# Patient Record
Sex: Female | Born: 1945 | Race: White | Hispanic: No | Marital: Married | Smoking: Former smoker
Health system: Southern US, Community
[De-identification: ages and names within clinical notes are randomized; demographics above are authoritative.]

## PROBLEM LIST (undated history)

## (undated) DIAGNOSIS — E785 Hyperlipidemia, unspecified: Secondary | ICD-10-CM

## (undated) DIAGNOSIS — J40 Bronchitis, not specified as acute or chronic: Secondary | ICD-10-CM

## (undated) DIAGNOSIS — E049 Nontoxic goiter, unspecified: Secondary | ICD-10-CM

## (undated) DIAGNOSIS — J329 Chronic sinusitis, unspecified: Secondary | ICD-10-CM

## (undated) DIAGNOSIS — J309 Allergic rhinitis, unspecified: Secondary | ICD-10-CM

## (undated) DIAGNOSIS — M1990 Unspecified osteoarthritis, unspecified site: Secondary | ICD-10-CM

## (undated) DIAGNOSIS — Z860100 Personal history of colon polyps, unspecified: Secondary | ICD-10-CM

## (undated) DIAGNOSIS — H269 Unspecified cataract: Secondary | ICD-10-CM

## (undated) DIAGNOSIS — E039 Hypothyroidism, unspecified: Secondary | ICD-10-CM

## (undated) DIAGNOSIS — R87613 High grade squamous intraepithelial lesion on cytologic smear of cervix (HGSIL): Secondary | ICD-10-CM

## (undated) DIAGNOSIS — H8109 Meniere's disease, unspecified ear: Secondary | ICD-10-CM

## (undated) DIAGNOSIS — B029 Zoster without complications: Secondary | ICD-10-CM

## (undated) DIAGNOSIS — R7303 Prediabetes: Secondary | ICD-10-CM

## (undated) DIAGNOSIS — C50919 Malignant neoplasm of unspecified site of unspecified female breast: Secondary | ICD-10-CM

## (undated) DIAGNOSIS — Z972 Presence of dental prosthetic device (complete) (partial): Secondary | ICD-10-CM

## (undated) DIAGNOSIS — R7309 Other abnormal glucose: Secondary | ICD-10-CM

## (undated) DIAGNOSIS — M797 Fibromyalgia: Secondary | ICD-10-CM

## (undated) DIAGNOSIS — F17200 Nicotine dependence, unspecified, uncomplicated: Secondary | ICD-10-CM

## (undated) DIAGNOSIS — M7989 Other specified soft tissue disorders: Secondary | ICD-10-CM

## (undated) DIAGNOSIS — Z923 Personal history of irradiation: Secondary | ICD-10-CM

## (undated) HISTORY — DX: Nicotine dependence, unspecified, uncomplicated: F17.200

## (undated) HISTORY — DX: Other abnormal glucose: R73.09

## (undated) HISTORY — PX: EYE SURGERY: SHX253

## (undated) HISTORY — DX: Personal history of colon polyps, unspecified: Z86.0100

## (undated) HISTORY — DX: Meniere's disease, unspecified ear: H81.09

## (undated) HISTORY — DX: Zoster without complications: B02.9

## (undated) HISTORY — DX: Hyperlipidemia, unspecified: E78.5

## (undated) HISTORY — DX: Unspecified cataract: H26.9

## (undated) HISTORY — DX: Allergic rhinitis, unspecified: J30.9

## (undated) HISTORY — DX: Other specified soft tissue disorders: M79.89

## (undated) HISTORY — DX: Fibromyalgia: M79.7

## (undated) HISTORY — DX: High grade squamous intraepithelial lesion on cytologic smear of cervix (HGSIL): R87.613

## (undated) HISTORY — DX: Personal history of colonic polyps: Z86.010

## (undated) HISTORY — DX: Chronic sinusitis, unspecified: J32.9

---

## 1997-06-15 HISTORY — PX: APPENDECTOMY: SHX54

## 2000-11-05 HISTORY — PX: BREAST BIOPSY: SHX20

## 2004-03-30 HISTORY — PX: COLONOSCOPY W/ BIOPSIES: SHX1374

## 2005-10-24 ENCOUNTER — Encounter: Payer: Self-pay | Admitting: Cardiovascular Disease

## 2006-10-15 LAB — HM COLONOSCOPY

## 2007-01-23 ENCOUNTER — Ambulatory Visit: Payer: Self-pay

## 2007-01-29 ENCOUNTER — Ambulatory Visit: Payer: Self-pay

## 2007-02-27 ENCOUNTER — Ambulatory Visit: Payer: Self-pay | Admitting: Gastroenterology

## 2008-08-27 ENCOUNTER — Ambulatory Visit: Payer: Self-pay | Admitting: Family Medicine

## 2008-08-27 ENCOUNTER — Encounter: Payer: Self-pay | Admitting: Cardiovascular Disease

## 2008-09-07 ENCOUNTER — Encounter: Payer: Self-pay | Admitting: Cardiovascular Disease

## 2008-09-14 HISTORY — PX: EXTERNAL EAR SURGERY: SHX627

## 2009-03-17 ENCOUNTER — Encounter: Payer: Self-pay | Admitting: Cardiovascular Disease

## 2009-10-15 DIAGNOSIS — H8109 Meniere's disease, unspecified ear: Secondary | ICD-10-CM

## 2009-10-15 HISTORY — DX: Meniere's disease, unspecified ear: H81.09

## 2009-10-15 HISTORY — PX: BREAST BIOPSY: SHX20

## 2010-08-21 ENCOUNTER — Encounter: Payer: Self-pay | Admitting: Cardiovascular Disease

## 2010-08-22 ENCOUNTER — Encounter: Payer: Self-pay | Admitting: Cardiovascular Disease

## 2010-09-05 ENCOUNTER — Ambulatory Visit: Payer: Self-pay | Admitting: Cardiovascular Disease

## 2010-09-05 DIAGNOSIS — E785 Hyperlipidemia, unspecified: Secondary | ICD-10-CM

## 2010-09-05 DIAGNOSIS — E1169 Type 2 diabetes mellitus with other specified complication: Secondary | ICD-10-CM | POA: Insufficient documentation

## 2010-09-05 DIAGNOSIS — R0602 Shortness of breath: Secondary | ICD-10-CM | POA: Insufficient documentation

## 2010-09-05 DIAGNOSIS — F17200 Nicotine dependence, unspecified, uncomplicated: Secondary | ICD-10-CM | POA: Insufficient documentation

## 2010-11-14 NOTE — Letter (Signed)
Summary: [facility] Office Note   [facility] Office Note   Imported By: Roderic Ovens 09/19/2010 16:07:33  _____________________________________________________________________  External Attachment:    Type:   Image     Comment:   External Document

## 2010-11-14 NOTE — Assessment & Plan Note (Signed)
Summary: NP6/AMD   Visit Type:  Initial Consult Primary Provider:  Ailene Ards, M.D.  CC:  c/o getting tired easily; she would like to start an exercise routine.Marland Kitchen  History of Present Illness: 65 yo woman, patient of Dr. Nilda Simmer, with fibromyalgia, hyperlipidemia, long smoking hx, obesity, family hx of CAD, who presents with SOB with exertion.   She does not exercise, has had significant weight gain over the past year or so. She has been trying to keep up with her twin sister who exercises and has been having trouble. Some chest tightness with SOB. She is uncertain if symptoms are secondary to lung problems from smoking, from deconditioning or from her heart.  She refused EKG today. We will try to obtain the most recent ekg from Dr. Katrinka Blazing EKG from 2010 shows NSR with rate of 75, unable to exclude anterospetal infarct  Stress test from 10/2005: no ischemia, no ekg changes  08/2010:   Chol 203, HDL 50, 128  Preventive Screening-Counseling & Management  Caffeine-Diet-Exercise     Does Patient Exercise: yes      Drug Use:  no.    Current Medications (verified): 1)  Sertraline Hcl 50 Mg Tabs (Sertraline Hcl) .... One Tablet Once Daily 2)  Aspirin 325 Mg Tabs (Aspirin) .... One Tablet Once Daily 3)  Furosemide 20 Mg Tabs (Furosemide) .... One Tablet Two Times A Day 4)  Fish Oil Maximum Strength 1200 Mg Caps (Omega-3 Fatty Acids) .... Two Tablets Daily 5)  Glucosamine Sulfate 1000 Mg Caps (Glucosamine Sulfate) .... One Tablet Once Daily 6)  Niacin Cr 500 Mg Cr-Caps (Niacin) .... One Tablet Once Daily 7)  Osteo Bi-Flex Adv Joint Shield  Tabs (Misc Natural Products) .... With Magnesium Two Tablet Once Daily 8)  Vitamin D3 2000 Unit Caps (Cholecalciferol) .... One Daily 9)  Chew For Health .... One Tablet Qd 10)  Swiss Kriss Herballaxative  Allergies (verified): No Known Drug Allergies  Past History:  Past Medical History: Last updated:  09/21/10 fibromyalgia appendectomy hyperlipidemia long smoking  Family History: Last updated: 21-Sep-2010 Father: Deceased; MI age 46 Mother:Deceased; after her birth of daughters.  Social History: Last updated: 2010/09/21 Married  Tobacco Use - Yes. 7 to 10 cigarettes per day for the past 40 yrs. Alcohol Use - yes- occas. Regular Exercise - yes Drug Use - no  Risk Factors: Exercise: yes (21-Sep-2010)  Risk Factors: Smoking Status: current (09-21-10)  Past Surgical History: right ear surgery appendectomy  Family History: Father: Deceased; MI age 29 Mother:Deceased; after her birth of daughters.  Social History: Married  Tobacco Use - Yes. 7 to 10 cigarettes per day for the past 40 yrs. Alcohol Use - yes- occas. Regular Exercise - yes Drug Use - no Does Patient Exercise:  yes Drug Use:  no  Review of Systems       The patient complains of chest pain and dyspnea on exertion.  The patient denies fever, weight loss, weight gain, vision loss, decreased hearing, hoarseness, syncope, peripheral edema, prolonged cough, abdominal pain, incontinence, muscle weakness, depression, and enlarged lymph nodes.    Vital Signs:  Patient profile:   65 year old female Height:      63 inches Weight:      175.50 pounds BMI:     31.20 Pulse rate:   78 / minute BP sitting:   122 / 62  (left arm) Cuff size:   regular  Vitals Entered By: Bishop Dublin, CMA (09/21/2010 2:53 PM)  Physical Exam  General:  Well developed, well nourished, in no acute distress. Head:  normocephalic and atraumatic Neck:  Neck supple, no JVD. No masses, thyromegaly or abnormal cervical nodes. Lungs:  Clear bilaterally to auscultation and percussion. Heart:  Non-displaced PMI, chest non-tender; regular rate and rhythm, S1, S2 without murmurs, rubs or gallops. Carotid upstroke normal, no bruit.  Pedals normal pulses. No edema, no varicosities. Abdomen:  Bowel sounds positive; abdomen soft and  non-tender without masses Msk:  Back normal, normal gait. Muscle strength and tone normal. Pulses:  pulses normal in all 4 extremities Extremities:  No clubbing or cyanosis. Neurologic:  Alert and oriented x 3. Skin:  Intact without lesions or rashes. Psych:  Normal affect.   Impression & Recommendations:  Problem # 1:  DYSPNEA (ICD-786.05) Symptoms are likley multifactorial. Long smoking hx, very deconditioned (old stress test in 2007, she only walked 3 minutes). Uncertain if angina is a part of her presentation. We talked at length ab out the various treatment options: including stress testing. She would like to wait on any testign and try to walk more to get into shape. She will call me if her symptoms get worse, including any chest pain.  Her updated medication list for this problem includes:    Aspirin 325 Mg Tabs (Aspirin) ..... One tablet once daily    Furosemide 20 Mg Tabs (Furosemide) ..... One tablet two times a day  Problem # 2:  HYPERLIPIDEMIA-MIXED (ICD-272.4) I have encouraged her to start a low dose statin, start every other day and advance to daily of tolerated. She is at high risk given her long smoking hx, family hx  Her updated medication list for this problem includes:    Niacin Cr 500 Mg Cr-caps (Niacin) ..... One tablet once daily    Crestor 5 Mg Tabs (Rosuvastatin calcium) .Marland Kitchen... Take one tablet by mouth daily.  Problem # 3:  SMOKER (ICD-305.1) She will try chantix in an attempt to stop smoking  Patient Instructions: 1)  Your physician has recommended you make the following change in your medication: Start taking Crestor 5mg  once daily  and Chantix as directed. Prescriptions: CHANTIX CONTINUING MONTH PAK 1 MG TABS (VARENICLINE TARTRATE) use as directed  #1 x 0   Entered by:   Cloyde Reams RN   Authorized by:   Dossie Arbour MD   Signed by:   Cloyde Reams RN on 09/05/2010   Method used:   Electronically to        [facility] [phone]*  (retail)       [address] [place], [place]  [address]       Ph: [phone]       Fax: [phone]   RxID:   [id number] CHANTIX STARTING MONTH PAK 0.5 MG X 11 & 1 MG X 42 TABS (VARENICLINE TARTRATE) use as directed  #1 x 0   Entered by:   Cloyde Reams RN   Authorized by:   Dossie Arbour MD   Signed by:   Cloyde Reams RN on 09/05/2010   Method used:   Electronically to        [facility] [phone]* (retail)       [address] [place], [place]  [address]       Ph: [phone]       Fax: [phone]   RxID:   [id number] CRESTOR 5 MG TABS (ROSUVASTATIN CALCIUM) Take one tablet by mouth  daily.  #30 x 3   Entered by:   Cloyde Reams RN   Authorized by:   Dossie Arbour MD   Signed by:   Cloyde Reams RN on 09/05/2010   Method used:   Print then Give to Patient   RxID:   [id number]   Appended Document: NP6/AMD correction;  she exercised for 7 minutes (not three minutes) on her stress test and achieved 7 METS

## 2010-11-14 NOTE — Letter (Signed)
Summary: [facility] Office Note   [facility] Office Note   Imported By: Roderic Ovens 09/20/2010 11:44:28  _____________________________________________________________________  External Attachment:    Type:   Image     Comment:   External Document

## 2011-05-02 ENCOUNTER — Encounter: Payer: Self-pay | Admitting: Cardiovascular Disease

## 2011-10-16 HISTORY — PX: COLPOSCOPY: SHX161

## 2011-10-16 LAB — HM PAP SMEAR

## 2011-11-22 LAB — HM MAMMOGRAPHY: HM Mammogram: NORMAL

## 2012-07-08 ENCOUNTER — Encounter: Payer: Self-pay | Admitting: Family Medicine

## 2012-07-10 ENCOUNTER — Encounter: Payer: Self-pay | Admitting: *Deleted

## 2012-08-14 ENCOUNTER — Telehealth: Payer: Self-pay

## 2012-08-14 DIAGNOSIS — Z1211 Encounter for screening for malignant neoplasm of colon: Secondary | ICD-10-CM

## 2012-08-14 NOTE — Telephone Encounter (Signed)
Pt is wanting to schedule an colonoscopy at dr Nigel Bridgeman office at [facility] clinic  Please call [phone]   Fax number for [facility] clinic [phone]

## 2012-08-15 NOTE — Telephone Encounter (Signed)
Referral has been sent to Dr. Bluford Kaufmann at [facility]

## 2012-08-16 NOTE — Telephone Encounter (Signed)
Left message for patient to return call.

## 2012-08-18 NOTE — Telephone Encounter (Signed)
Called patient to advise  °

## 2012-08-19 ENCOUNTER — Telehealth: Payer: Self-pay

## 2012-08-19 NOTE — Telephone Encounter (Signed)
Pt referred by [place] to [facility] clinic, Aundra Millet is needing copies of pt last ov notes and any lab results please fax info to [phone] ATTEN: Aundra Millet if any question please call [phone]

## 2012-10-06 ENCOUNTER — Telehealth: Payer: Self-pay | Admitting: *Deleted

## 2012-10-06 ENCOUNTER — Encounter: Payer: Self-pay | Admitting: Family Medicine

## 2012-10-06 ENCOUNTER — Ambulatory Visit (INDEPENDENT_AMBULATORY_CARE_PROVIDER_SITE_OTHER): Payer: Medicare Other | Admitting: Family Medicine

## 2012-10-06 VITALS — BP 112/62 | HR 69 | Temp 98.0°F | Resp 16 | Ht 62.0 in | Wt 196.2 lb

## 2012-10-06 DIAGNOSIS — Z0001 Encounter for general adult medical examination with abnormal findings: Secondary | ICD-10-CM | POA: Insufficient documentation

## 2012-10-06 DIAGNOSIS — I878 Other specified disorders of veins: Secondary | ICD-10-CM | POA: Insufficient documentation

## 2012-10-06 DIAGNOSIS — Z01419 Encounter for gynecological examination (general) (routine) without abnormal findings: Secondary | ICD-10-CM | POA: Insufficient documentation

## 2012-10-06 DIAGNOSIS — R7309 Other abnormal glucose: Secondary | ICD-10-CM

## 2012-10-06 DIAGNOSIS — I872 Venous insufficiency (chronic) (peripheral): Secondary | ICD-10-CM

## 2012-10-06 DIAGNOSIS — F329 Major depressive disorder, single episode, unspecified: Secondary | ICD-10-CM | POA: Insufficient documentation

## 2012-10-06 DIAGNOSIS — Z0000 Encounter for general adult medical examination without abnormal findings: Secondary | ICD-10-CM

## 2012-10-06 DIAGNOSIS — F419 Anxiety disorder, unspecified: Secondary | ICD-10-CM | POA: Insufficient documentation

## 2012-10-06 DIAGNOSIS — F341 Dysthymic disorder: Secondary | ICD-10-CM

## 2012-10-06 DIAGNOSIS — F32A Depression, unspecified: Secondary | ICD-10-CM | POA: Insufficient documentation

## 2012-10-06 DIAGNOSIS — Z23 Encounter for immunization: Secondary | ICD-10-CM | POA: Insufficient documentation

## 2012-10-06 DIAGNOSIS — E7800 Pure hypercholesterolemia, unspecified: Secondary | ICD-10-CM

## 2012-10-06 LAB — COMPREHENSIVE METABOLIC PANEL
ALT: 20 U/L (ref 0–35)
AST: 21 U/L (ref 0–37)
Albumin: 4.5 g/dL (ref 3.5–5.2)
Alkaline Phosphatase: 67 U/L (ref 39–117)
BUN: 11 mg/dL (ref 6–23)
CO2: 27 mEq/L (ref 19–32)
Calcium: 9.3 mg/dL (ref 8.4–10.5)
Chloride: 103 mEq/L (ref 96–112)
Creat: 1.08 mg/dL (ref 0.50–1.10)
Glucose, Bld: 102 mg/dL — ABNORMAL HIGH (ref 70–99)
Potassium: 4 mEq/L (ref 3.5–5.3)
Sodium: 139 mEq/L (ref 135–145)
Total Bilirubin: 0.6 mg/dL (ref 0.3–1.2)
Total Protein: 7 g/dL (ref 6.0–8.3)

## 2012-10-06 LAB — POCT URINALYSIS DIPSTICK
Bilirubin, UA: NEGATIVE
Blood, UA: NEGATIVE
Glucose, UA: NEGATIVE
Ketones, UA: NEGATIVE
Leukocytes, UA: NEGATIVE
Nitrite, UA: NEGATIVE
Protein, UA: NEGATIVE
Spec Grav, UA: 1.01
Urobilinogen, UA: 0.2
pH, UA: 7

## 2012-10-06 LAB — CBC WITH DIFFERENTIAL/PLATELET
Basophils Absolute: 0 10*3/uL (ref 0.0–0.1)
Basophils Relative: 1 % (ref 0–1)
Eosinophils Absolute: 0.2 10*3/uL (ref 0.0–0.7)
Eosinophils Relative: 4 % (ref 0–5)
HCT: 37.7 % (ref 36.0–46.0)
Hemoglobin: 13.5 g/dL (ref 12.0–15.0)
Lymphocytes Relative: 33 % (ref 12–46)
Lymphs Abs: 1.8 10*3/uL (ref 0.7–4.0)
MCH: 31.8 pg (ref 26.0–34.0)
MCHC: 35.8 g/dL (ref 30.0–36.0)
MCV: 88.7 fL (ref 78.0–100.0)
Monocytes Absolute: 0.5 10*3/uL (ref 0.1–1.0)
Monocytes Relative: 8 % (ref 3–12)
Neutro Abs: 2.9 10*3/uL (ref 1.7–7.7)
Neutrophils Relative %: 54 % (ref 43–77)
Platelets: 214 10*3/uL (ref 150–400)
RBC: 4.25 MIL/uL (ref 3.87–5.11)
RDW: 14.3 % (ref 11.5–15.5)
WBC: 5.5 10*3/uL (ref 4.0–10.5)

## 2012-10-06 LAB — LIPID PANEL
Cholesterol: 146 mg/dL (ref 0–200)
HDL: 53 mg/dL (ref >39)
LDL Cholesterol: 68 mg/dL (ref 0–99)
Total CHOL/HDL Ratio: 2.8 Ratio
Triglycerides: 127 mg/dL (ref <150)
VLDL: 25 mg/dL (ref 0–40)

## 2012-10-06 LAB — CK: Total CK: 202 U/L — ABNORMAL HIGH (ref 7–177)

## 2012-10-06 MED ORDER — FUROSEMIDE 20 MG PO TABS: 20.0000 mg | ORAL_TABLET | Freq: Two times a day (BID) | ORAL | Status: DC

## 2012-10-06 MED ORDER — ATORVASTATIN CALCIUM 10 MG PO TABS: 10.0000 mg | ORAL_TABLET | Freq: Every day | ORAL | Status: DC

## 2012-10-06 NOTE — Assessment & Plan Note (Signed)
Anticipatory guidance --- weight loss, exercise.  Pap smear obtained; mammogram UTD.

## 2012-10-06 NOTE — Progress Notes (Signed)
  Subjective:    Patient ID: Carol [name], female    DOB: 03/15/1946, 66 y.o.   MRN: [id number]  HPI    Review of Systems  Constitutional: Negative.   HENT: Negative.   Eyes: Negative.   Respiratory: Negative.   Cardiovascular: Positive for leg swelling.  Gastrointestinal: Negative.   Genitourinary: Negative.   Musculoskeletal: Positive for myalgias.  Skin: Negative.   Neurological: Negative.   Hematological: Negative.   Psychiatric/Behavioral: Negative.        Objective:   Physical Exam        Assessment & Plan:

## 2012-10-06 NOTE — Patient Instructions (Signed)
1. Routine general medical examination at a health care facility  CBC with Differential, Comprehensive metabolic panel, Lipid panel, CK, POCT urinalysis dipstick, EKG 12-Lead  2. Pure hypercholesterolemia  CBC with Differential, Comprehensive metabolic panel, Lipid panel  3. Other abnormal glucose  Hemoglobin A1c  4. Anxiety and depression    5. Venous stasis

## 2012-10-06 NOTE — Telephone Encounter (Signed)
Faxed authorization form requesting medical records and confirmation page received.

## 2012-10-06 NOTE — Progress Notes (Signed)
[address]   [place], [place]  [address]   [phone]  Subjective:    Patient ID: Carol [name], female    DOB: 1946/02/06, 66 y.o.   MRN: [id number]  HPIThis 66 y.o. female presents for evaluation to establish care and for CPE.    Last CPE 08/2011.   Pap smear by Senaida Ores 10/2011 s/p colposcopy in 10/2011; overdue for repeat pap smear in 04/2012.   Mammogram 06/2012.   Colonoscopy due; called to schedule appointment; [facility] in [place] or Dr. Bluford Kaufmann but waiting for appointment to be scheduled.   TDAP 2012    Zostavax never but plans to get one; insurance will not pay for it; will go toward deductible.   Pneumovax never.   Influenza vaccine not this year.   Eye exam 2013; +cataracts early; no glaucoma; +glasses.   Dental exam every six months.    Bone density scan 2011.    1.  Hyperlipidemia: six month follow-up; no change in management at last visit; reports good compliance with medication; good tolerance to medication; good symptom control.  Concerned about what she has heard on the news about Lipitor causing DMII.  Denies CP/palp/SOB/leg swelling.  Denies HA/vision changes/diplopia/blurred vision.    2.  Depression: has got into an emotional funk over past six months.  No SI.  Major family stressors with two sisters. Feelings really hurt. Lack of motivation.  Not bathing.  Comfort eating.  Excessive crying.  Compliance with Zoloft 50mg  daily; had increased to 75mg  daily for a few months but then decreased back down to 50mg  daily.  Sleeping a lot.    3.  Fibromyalgia:  Stable despite worsening depression; sees Chiropractor in [place] with great benefit in fibromyalgia.  Continues to struggle regularly with various aches and myalgias.    4.  Venous stasis: stable.  Needs refill on lasix.  Swelling has worsened with recent weight gain due to depression/anxiety.     Review of Systems  Constitutional: Negative for fever, chills, diaphoresis, activity change, appetite change,  fatigue and unexpected weight change.  HENT: Positive for hearing loss and tinnitus. Negative for ear pain, nosebleeds, congestion, sore throat, facial swelling, rhinorrhea, sneezing, drooling, mouth sores, trouble swallowing, neck pain, neck stiffness, dental problem, voice change, postnasal drip, sinus pressure and ear discharge.   Eyes: Negative for photophobia, pain, discharge, redness, itching and visual disturbance.  Respiratory: Negative for apnea, cough, choking, chest tightness, shortness of breath, wheezing and stridor.   Cardiovascular: Positive for leg swelling. Negative for chest pain and palpitations.  Gastrointestinal: Negative for nausea, vomiting, abdominal pain, diarrhea, constipation, blood in stool, abdominal distention, anal bleeding and rectal pain.  Genitourinary: Negative for dysuria, urgency, frequency, hematuria, flank pain, decreased urine volume, vaginal bleeding, vaginal discharge, enuresis, difficulty urinating, genital sores, vaginal pain, menstrual problem, pelvic pain and dyspareunia.  Musculoskeletal: Positive for arthralgias. Negative for myalgias, back pain, joint swelling and gait problem.  Skin: Negative for color change, pallor, rash and wound.  Neurological: Negative for dizziness, tremors, seizures, syncope, facial asymmetry, speech difficulty, weakness, light-headedness, numbness and headaches.  Hematological: Negative for adenopathy. Does not bruise/bleed easily.  Psychiatric/Behavioral: Positive for dysphoric mood. Negative for suicidal ideas, hallucinations, behavioral problems, confusion, sleep disturbance, self-injury, decreased concentration and agitation. The patient is not nervous/anxious and is not hyperactive.         Past Medical History  Diagnosis Date  . Fibromyalgia   . HLD (hyperlipidemia)   . Personal history of colonic polyps   . 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 Unspecified  sinusitis (chronic)   . Other abnormal glucose   . Swelling of limb   . Tobacco use disorder   . 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 Allergic rhinitis, cause unspecified   . Chicken pox     Past Surgical History  Procedure Date  . Appendectomy 06/1997  . External ear surgery 09/2008    external - not specified  (RIGHT)  . Breast biopsy 2002    Right  . Colposcopy 10/16/2011    HGSIL pap smear.  Negative.  . Colonoscopy 10/15/2006    [facility].    Prior to Admission medications   Medication Sig Start Date End Date Taking? Authorizing Provider  ascorbic acid (VITAMIN C) 500 MG tablet Take 500 mg by mouth daily.   Yes Historical Provider, MD  aspirin 325 MG tablet Take 325 mg by mouth daily.     Yes Historical Provider, MD  atorvastatin (LIPITOR) 10 MG tablet Take 1 tablet (10 mg total) by mouth daily. 10/06/12  Yes Ethelda Chick, MD  Cetirizine HCl (ZYRTEC PO) Take 10 mg by mouth.   Yes Historical Provider, MD  Cholecalciferol (VITAMIN D3) 2000 UNITS capsule Take 2,000 Units by mouth daily.     Yes Historical Provider, MD  furosemide (LASIX) 20 MG tablet Take 1 tablet (20 mg total) by mouth 2 (two) times daily. 10/06/12  Yes Ethelda Chick, MD  Glucosamine Sulfate 1000 MG CAPS Take 1 capsule by mouth daily.     Yes Historical Provider, MD  HONEY PO Take by mouth daily.   Yes Historical Provider, MD  Misc Natural Products (OSTEO BI-FLEX JOINT SHIELD) TABS Take 2 tablets by mouth daily. With magnesium    Yes Historical Provider, MD  Multiple Vitamin (MULTIVITAMIN) tablet Take 1 tablet by mouth daily.   Yes Historical Provider, MD  niacin (NIASPAN) 500 MG CR tablet Take 500 mg by mouth daily.     Yes Historical Provider, MD  NON Katharine Look for health - 1 tablet daily    Yes Historical Provider, MD  NON FORMULARY Swiss kirss herbalaxative    Yes Historical Provider, MD  sertraline (ZOLOFT) 50 MG tablet Take 50 mg by mouth daily.     Yes Historical Provider, MD  cyclobenzaprine (FLEXERIL) 10 MG tablet  Take 10 mg by mouth 2 (two) times daily as needed.    Historical Provider, MD  fluticasone (FLONASE) 50 MCG/ACT nasal spray Place 2 sprays into the nose daily.    Historical Provider, MD  Omega-3 Fatty Acids (FISH OIL MAXIMUM STRENGTH) 1200 MG CAPS Take 2 capsules by mouth daily.      Historical Provider, MD  rosuvastatin (CRESTOR) 5 MG tablet Take 5 mg by mouth daily.      Historical Provider, MD  varenicline (CHANTIX CONTINUING MONTH PAK) 1 MG tablet Take 1 mg by mouth as directed.      Historical Provider, MD    No Known Allergies  History   Social History  . Marital Status: Married    Spouse Name: N/A    Number of Children: 2  . Years of Education: N/A   Occupational History  . homemaker    Social History Main Topics  . Smoking status: Former Smoker -- 1.0 packs/day for 40 years    Types: Cigarettes    Quit date: 04/01/2012  . Smokeless tobacco: Not on file     Comment: 7-10 cigarettes for past 40 years   .  Alcohol Use: Yes     Comment: occasional  5 drinks per year  . Drug Use: No  . Sexually Active: Yes   Other Topics Concern  . Not on file   Social History Narrative   Marital status: married x 40 years; happily married; no abuse.   Children: 2 sons; no grandchildren.   Lives: with husband.   Employment: unemployed.   Tobacco: quit smoking 03/2012.  Electronic cigarette.    Always uses seat belts.   Smoke alarm and carbon monoxide detector in the home.    No guns.   Caffeine NWG:NFAOZH 3 servings per day.    Family History  Problem Relation Age of Onset  . Arthritis    . Coronary artery disease    . Heart failure Father   . Hypertension Father   . Stroke Father   . Hyperlipidemia Father   . Heart failure Brother   . Diabetes Brother   . Diabetes Brother   . Heart disease Brother   . Hyperlipidemia Brother   . Hypertension Brother   . Hypertension Sister   . Depression Sister     Objective:   Physical Exam  Nursing note and vitals  reviewed. Constitutional: She is oriented to person, place, and time. She appears well-developed and well-nourished. No distress.  HENT:  Head: Normocephalic and atraumatic.  Right Ear: External ear normal.  Left Ear: External ear normal.  Nose: Nose normal.  Mouth/Throat: Oropharynx is clear and moist. No oropharyngeal exudate.  Eyes: Conjunctivae normal and EOM are normal. Pupils are equal, round, and reactive to light.  Neck: Normal range of motion. Neck supple. No JVD present. No thyromegaly present.  Cardiovascular: Normal rate, regular rhythm, normal heart sounds and intact distal pulses.  Exam reveals no gallop and no friction rub.   No murmur heard. Pulmonary/Chest: Effort normal and breath sounds normal. She has no wheezes. She has no rales.  Abdominal: Soft. Bowel sounds are normal. She exhibits no distension and no mass. There is no tenderness. There is no rebound and no guarding.  Genitourinary: Vagina normal and uterus normal. No breast swelling, tenderness, discharge or bleeding. There is no rash, tenderness, lesion or injury on the right labia. There is no rash, tenderness, lesion or injury on the left labia. Cervix exhibits no motion tenderness, no discharge and no friability. Right adnexum displays no mass, no tenderness and no fullness. Left adnexum displays no mass, no tenderness and no fullness. No vaginal discharge found.  Musculoskeletal:       Right shoulder: Normal.       Left shoulder: Normal.       Cervical back: Normal.  Lymphadenopathy:    She has no cervical adenopathy.  Neurological: She is alert and oriented to person, place, and time. She has normal reflexes. No cranial nerve deficit. She exhibits normal muscle tone. Coordination normal.  Skin: Skin is warm and dry. No rash noted. She is not diaphoretic. No erythema. No pallor.  Psychiatric: She has a normal mood and affect. Her behavior is normal. Judgment and thought content normal.    EKG: NSR; NO ST  CHANGES; NO ACUTE CHANGES.   PNEUMOVAX ADMINISTERED; INFLUENZA VACCINE ADMINISTERED.    Assessment & Plan:   1. Routine general medical examination at a health care facility  CBC with Differential, Comprehensive metabolic panel, Lipid panel, CK, POCT urinalysis dipstick, EKG 12-Lead  2. Pure hypercholesterolemia  CBC with Differential, Comprehensive metabolic panel, Lipid panel  3. Other abnormal glucose  Hemoglobin  A1c  4. Anxiety and depression    5. Venous stasis    6. Routine gynecological examination  Pap IG w/ reflex to HPV when ASC-U  7. Need for influenza vaccination  Flu vaccine greater than or equal to 3yo preservative free IM  8. Need for pneumococcal vaccination  Pneumococcal polysaccharide vaccine 23-valent greater than or equal to 2yo subcutaneous/IM     1.  CPE: anticipatory guidance --- weight loss, exercise.  Pap smear obtained; mammogram UTD.  Awaiting an appointment for repeat colonoscopy.  S/p Pneumovax and influenza vaccines in office; plans to receive Zostavax in future.  Recent depression with improvement.  No hearing loss. Independent with ADLs.  Low fall risk.  FULL CODE. 2. Gynecological exam: completed; pap smear obtained; mammogram UTD. 3.  Glucose Intolerance: stable; obtain labs; continue with dietary modification. 4.  Hyperlipidemia: controlled; obtain labs; continue current medication. 5.  Venous stasis: stable; refill of Lasix provided. 6.  S/p influenza vaccine. 7.  S/p Pneumovax.  Meds ordered this encounter  Medications  . Cetirizine HCl (ZYRTEC PO)    Sig: Take 10 mg by mouth.  . HONEY PO    Sig: Take by mouth daily.  . Multiple Vitamin (MULTIVITAMIN) tablet    Sig: Take 1 tablet by mouth daily.  . furosemide (LASIX) 20 MG tablet    Sig: Take 1 tablet (20 mg total) by mouth 2 (two) times daily.    Dispense:  180 tablet    Refill:  3  . atorvastatin (LIPITOR) 10 MG tablet    Sig: Take 1 tablet (10 mg total) by mouth daily.    Dispense:  90  tablet    Refill:  3

## 2012-10-07 LAB — HEMOGLOBIN A1C
Hgb A1c MFr Bld: 5.7 % — ABNORMAL HIGH (ref <5.7)
Mean Plasma Glucose: 117 mg/dL — ABNORMAL HIGH (ref <117)

## 2012-10-07 LAB — PAP IG W/ RFLX HPV ASCU

## 2012-10-13 ENCOUNTER — Telehealth: Payer: Self-pay

## 2012-10-13 NOTE — Telephone Encounter (Signed)
Pt is wanting a referral to dr outlaw   Best number is [phone]

## 2012-10-14 NOTE — Telephone Encounter (Signed)
Lupita Leash, can you please change GI/colonoscopy referral to Dr Dulce Sellar and let pt know when done?

## 2012-10-15 HISTORY — PX: COLONOSCOPY: SHX174

## 2012-11-29 NOTE — Progress Notes (Signed)
Reviewed and agree.

## 2013-04-06 ENCOUNTER — Encounter: Payer: Self-pay | Admitting: Family Medicine

## 2013-04-06 ENCOUNTER — Ambulatory Visit: Payer: Medicare Other

## 2013-04-06 ENCOUNTER — Ambulatory Visit (INDEPENDENT_AMBULATORY_CARE_PROVIDER_SITE_OTHER): Payer: Medicare Other | Admitting: Family Medicine

## 2013-04-06 VITALS — BP 126/76 | HR 80 | Temp 98.7°F | Resp 16 | Ht 62.0 in | Wt 205.2 lb

## 2013-04-06 DIAGNOSIS — M25539 Pain in unspecified wrist: Secondary | ICD-10-CM

## 2013-04-06 DIAGNOSIS — M79672 Pain in left foot: Secondary | ICD-10-CM

## 2013-04-06 DIAGNOSIS — M79609 Pain in unspecified limb: Secondary | ICD-10-CM

## 2013-04-06 DIAGNOSIS — M25532 Pain in left wrist: Secondary | ICD-10-CM

## 2013-04-06 DIAGNOSIS — I878 Other specified disorders of veins: Secondary | ICD-10-CM

## 2013-04-06 DIAGNOSIS — E7800 Pure hypercholesterolemia, unspecified: Secondary | ICD-10-CM

## 2013-04-06 DIAGNOSIS — M797 Fibromyalgia: Secondary | ICD-10-CM

## 2013-04-06 DIAGNOSIS — H8110 Benign paroxysmal vertigo, unspecified ear: Secondary | ICD-10-CM

## 2013-04-06 DIAGNOSIS — J0100 Acute maxillary sinusitis, unspecified: Secondary | ICD-10-CM

## 2013-04-06 DIAGNOSIS — I872 Venous insufficiency (chronic) (peripheral): Secondary | ICD-10-CM

## 2013-04-06 DIAGNOSIS — F32A Depression, unspecified: Secondary | ICD-10-CM

## 2013-04-06 DIAGNOSIS — F329 Major depressive disorder, single episode, unspecified: Secondary | ICD-10-CM

## 2013-04-06 DIAGNOSIS — R7309 Other abnormal glucose: Secondary | ICD-10-CM

## 2013-04-06 DIAGNOSIS — IMO0001 Reserved for inherently not codable concepts without codable children: Secondary | ICD-10-CM

## 2013-04-06 LAB — LIPID PANEL
Cholesterol: 247 mg/dL — ABNORMAL HIGH (ref 0–200)
HDL: 52 mg/dL (ref >39)
LDL Cholesterol: 164 mg/dL — ABNORMAL HIGH (ref 0–99)
Total CHOL/HDL Ratio: 4.8 Ratio
Triglycerides: 155 mg/dL — ABNORMAL HIGH (ref <150)
VLDL: 31 mg/dL (ref 0–40)

## 2013-04-06 LAB — COMPREHENSIVE METABOLIC PANEL
ALT: 25 U/L (ref 0–35)
AST: 22 U/L (ref 0–37)
Albumin: 4.3 g/dL (ref 3.5–5.2)
Alkaline Phosphatase: 72 U/L (ref 39–117)
BUN: 11 mg/dL (ref 6–23)
CO2: 29 mEq/L (ref 19–32)
Calcium: 9.7 mg/dL (ref 8.4–10.5)
Chloride: 102 mEq/L (ref 96–112)
Creat: 1.02 mg/dL (ref 0.50–1.10)
Glucose, Bld: 101 mg/dL — ABNORMAL HIGH (ref 70–99)
Potassium: 4.3 mEq/L (ref 3.5–5.3)
Sodium: 140 mEq/L (ref 135–145)
Total Bilirubin: 0.5 mg/dL (ref 0.3–1.2)
Total Protein: 6.9 g/dL (ref 6.0–8.3)

## 2013-04-06 LAB — CBC WITH DIFFERENTIAL/PLATELET
Basophils Absolute: 0.1 10*3/uL (ref 0.0–0.1)
Basophils Relative: 1 % (ref 0–1)
Eosinophils Absolute: 0.1 10*3/uL (ref 0.0–0.7)
Eosinophils Relative: 2 % (ref 0–5)
HCT: 38.8 % (ref 36.0–46.0)
Hemoglobin: 13.4 g/dL (ref 12.0–15.0)
Lymphocytes Relative: 31 % (ref 12–46)
Lymphs Abs: 1.4 10*3/uL (ref 0.7–4.0)
MCH: 29.8 pg (ref 26.0–34.0)
MCHC: 34.5 g/dL (ref 30.0–36.0)
MCV: 86.4 fL (ref 78.0–100.0)
Monocytes Absolute: 0.6 10*3/uL (ref 0.1–1.0)
Monocytes Relative: 13 % — ABNORMAL HIGH (ref 3–12)
Neutro Abs: 2.4 10*3/uL (ref 1.7–7.7)
Neutrophils Relative %: 53 % (ref 43–77)
Platelets: 214 10*3/uL (ref 150–400)
RBC: 4.49 MIL/uL (ref 3.87–5.11)
RDW: 14.5 % (ref 11.5–15.5)
WBC: 4.6 10*3/uL (ref 4.0–10.5)

## 2013-04-06 LAB — HEMOGLOBIN A1C
Hgb A1c MFr Bld: 5.7 % — ABNORMAL HIGH (ref <5.7)
Mean Plasma Glucose: 117 mg/dL — ABNORMAL HIGH (ref <117)

## 2013-04-06 LAB — CK: Total CK: 73 U/L (ref 7–177)

## 2013-04-06 MED ORDER — FUROSEMIDE 20 MG PO TABS: 20.0000 mg | ORAL_TABLET | Freq: Two times a day (BID) | ORAL | Status: DC

## 2013-04-06 MED ORDER — AMOXICILLIN 500 MG PO CAPS: 1000.0000 mg | ORAL_CAPSULE | Freq: Two times a day (BID) | ORAL | Status: DC

## 2013-04-06 MED ORDER — ATORVASTATIN CALCIUM 10 MG PO TABS: 10.0000 mg | ORAL_TABLET | Freq: Every day | ORAL | Status: DC

## 2013-04-06 MED ORDER — MELOXICAM 15 MG PO TABS: 15.0000 mg | ORAL_TABLET | Freq: Every day | ORAL | Status: DC

## 2013-04-06 MED ORDER — DIAZEPAM 2 MG PO TABS: 2.0000 mg | ORAL_TABLET | Freq: Three times a day (TID) | ORAL | Status: DC | PRN

## 2013-04-06 NOTE — Patient Instructions (Addendum)
1. Wear wrist splint daily for two weeks during the day; remove wrist splint every night. After two weeks, only wear wrist splint as needed. 2.  Ice wrist for 15-20 minutes once daily. 3. Ice heels once daily for 15-20 minutes. 4.  Take Meloxicam 15mg  one daily for wrist pain and heel pain for two weeks.   Plantar Fasciitis (Heel Spur Syndrome) with Rehab The plantar fascia is a fibrous, ligament-like, soft-tissue structure that spans the bottom of the foot. Plantar fasciitis is a condition that causes pain in the foot due to inflammation of the tissue. SYMPTOMS   Pain and tenderness on the underneath side of the foot.  Pain that worsens with standing or walking. CAUSES  Plantar fasciitis is caused by irritation and injury to the plantar fascia on the underneath side of the foot. Common mechanisms of injury include:  Direct trauma to bottom of the foot.  Damage to a small nerve that runs under the foot where the main fascia attaches to the heel bone.  Stress placed on the plantar fascia due to bone spurs. RISK INCREASES WITH:   Activities that place stress on the plantar fascia (running, jumping, pivoting, or cutting).  Poor strength and flexibility.  Improperly fitted shoes.  Tight calf muscles.  Flat feet.  Failure to warm-up properly before activity.  Obesity. PREVENTION  Warm up and stretch properly before activity.  Allow for adequate recovery between workouts.  Maintain physical fitness:  Strength, flexibility, and endurance.  Cardiovascular fitness.  Maintain a health body weight.  Avoid stress on the plantar fascia.  Wear properly fitted shoes, including arch supports for individuals who have flat feet. PROGNOSIS  If treated properly, then the symptoms of plantar fasciitis usually resolve without surgery. However, occasionally surgery is necessary. RELATED COMPLICATIONS   Recurrent symptoms that may result in a chronic condition.  Problems of the  lower back that are caused by compensating for the injury, such as limping.  Pain or weakness of the foot during push-off following surgery.  Chronic inflammation, scarring, and partial or complete fascia tear, occurring more often from repeated injections. TREATMENT  Treatment initially involves the use of ice and medication to help reduce pain and inflammation. The use of strengthening and stretching exercises may help reduce pain with activity, especially stretches of the Achilles tendon. These exercises may be performed at home or with a therapist. Your caregiver may recommend that you use heel cups of arch supports to help reduce stress on the plantar fascia. Occasionally, corticosteroid injections are given to reduce inflammation. If symptoms persist for greater than 6 months despite non-surgical (conservative), then surgery may be recommended.  MEDICATION   If pain medication is necessary, then nonsteroidal anti-inflammatory medications, such as aspirin and ibuprofen, or other minor pain relievers, such as acetaminophen, are often recommended.  Do not take pain medication within 7 days before surgery.  Prescription pain relievers may be given if deemed necessary by your caregiver. Use only as directed and only as much as you need.  Corticosteroid injections may be given by your caregiver. These injections should be reserved for the most serious cases, because they may only be given a certain number of times. HEAT AND COLD  Cold treatment (icing) relieves pain and reduces inflammation. Cold treatment should be applied for 10 to 15 minutes every 2 to 3 hours for inflammation and pain and immediately after any activity that aggravates your symptoms. Use ice packs or massage the area with a piece of ice (ice  massage).  Heat treatment may be used prior to performing the stretching and strengthening activities prescribed by your caregiver, physical therapist, or athletic trainer. Use a heat pack  or soak the injury in warm water. SEEK IMMEDIATE MEDICAL CARE IF:  Treatment seems to offer no benefit, or the condition worsens.  Any medications produce adverse side effects. EXERCISES RANGE OF MOTION (ROM) AND STRETCHING EXERCISES - Plantar Fasciitis (Heel Spur Syndrome) These exercises may help you when beginning to rehabilitate your injury. Your symptoms may resolve with or without further involvement from your physician, physical therapist or athletic trainer. While completing these exercises, remember:   Restoring tissue flexibility helps normal motion to return to the joints. This allows healthier, less painful movement and activity.  An effective stretch should be held for at least 30 seconds.  A stretch should never be painful. You should only feel a gentle lengthening or release in the stretched tissue. RANGE OF MOTION - Toe Extension, Flexion  Sit with your right / left leg crossed over your opposite knee.  Grasp your toes and gently pull them back toward the top of your foot. You should feel a stretch on the bottom of your toes and/or foot.  Hold this stretch for __________ seconds.  Now, gently pull your toes toward the bottom of your foot. You should feel a stretch on the top of your toes and or foot.  Hold this stretch for __________ seconds. Repeat __________ times. Complete this stretch __________ times per day.  RANGE OF MOTION - Ankle Dorsiflexion, Active Assisted  Remove shoes and sit on a chair that is preferably not on a carpeted surface.  Place right / left foot under knee. Extend your opposite leg for support.  Keeping your heel down, slide your right / left foot back toward the chair until you feel a stretch at your ankle or calf. If you do not feel a stretch, slide your bottom forward to the edge of the chair, while still keeping your heel down.  Hold this stretch for __________ seconds. Repeat __________ times. Complete this stretch __________ times per  day.  STRETCH  Gastroc, Standing  Place hands on wall.  Extend right / left leg, keeping the front knee somewhat bent.  Slightly point your toes inward on your back foot.  Keeping your right / left heel on the floor and your knee straight, shift your weight toward the wall, not allowing your back to arch.  You should feel a gentle stretch in the right / left calf. Hold this position for __________ seconds. Repeat __________ times. Complete this stretch __________ times per day. STRETCH  Soleus, Standing  Place hands on wall.  Extend right / left leg, keeping the other knee somewhat bent.  Slightly point your toes inward on your back foot.  Keep your right / left heel on the floor, bend your back knee, and slightly shift your weight over the back leg so that you feel a gentle stretch deep in your back calf.  Hold this position for __________ seconds. Repeat __________ times. Complete this stretch __________ times per day. STRETCH  Gastrocsoleus, Standing  Note: This exercise can place a lot of stress on your foot and ankle. Please complete this exercise only if specifically instructed by your caregiver.   Place the ball of your right / left foot on a step, keeping your other foot firmly on the same step.  Hold on to the wall or a rail for balance.  Slowly lift your  other foot, allowing your body weight to press your heel down over the edge of the step.  You should feel a stretch in your right / left calf.  Hold this position for __________ seconds.  Repeat this exercise with a slight bend in your right / left knee. Repeat __________ times. Complete this stretch __________ times per day.  STRENGTHENING EXERCISES - Plantar Fasciitis (Heel Spur Syndrome)  These exercises may help you when beginning to rehabilitate your injury. They may resolve your symptoms with or without further involvement from your physician, physical therapist or athletic trainer. While completing these  exercises, remember:   Muscles can gain both the endurance and the strength needed for everyday activities through controlled exercises.  Complete these exercises as instructed by your physician, physical therapist or athletic trainer. Progress the resistance and repetitions only as guided. STRENGTH - Towel Curls  Sit in a chair positioned on a non-carpeted surface.  Place your foot on a towel, keeping your heel on the floor.  Pull the towel toward your heel by only curling your toes. Keep your heel on the floor.  If instructed by your physician, physical therapist or athletic trainer, add ____________________ at the end of the towel. Repeat __________ times. Complete this exercise __________ times per day. STRENGTH - Ankle Inversion  Secure one end of a rubber exercise band/tubing to a fixed object (table, pole). Loop the other end around your foot just before your toes.  Place your fists between your knees. This will focus your strengthening at your ankle.  Slowly, pull your big toe up and in, making sure the band/tubing is positioned to resist the entire motion.  Hold this position for __________ seconds.  Have your muscles resist the band/tubing as it slowly pulls your foot back to the starting position. Repeat __________ times. Complete this exercises __________ times per day.  Document Released: 10/01/2005 Document Revised: 12/24/2011 Document Reviewed: 01/13/2009 [facility] Patient Information 2014 [place], [place].

## 2013-04-06 NOTE — Progress Notes (Signed)
[address]   [place], [place]  [address]   [phone]  Subjective:    Patient ID: Carol [name], female    DOB: 1945/11/05, 67 y.o.   MRN: [id number]  HPI This 67 y.o. female presents for six month follow-up:  1. Hyperlipidemia:  Six month follow-up; no changes to management made at last visit.  Good compliance with medication; good tolerance to medication; good symptom control.   2. Fibromyalgia:  Stable; continues to get regular massages with improved symptoms.  Compliance with Zoloft.  3.  Venous stasis: stable; compliance with Lasix; needs refill; sisters gave money for compression stockings for birthday.   4.  Depression:  Only taking Zoloft 50mg  daily; still in a funk.  No motivation; lazy; isolating self; not sleeping well.  Sleeping well during the day.  Took Mucinex yesterday and did not sleep well last night.  No SI.  Helps sister out 1-2 times per week.  5.  Colonoscopy:  Did not have colonoscopy; got very sick.  6.  Mammogram:  No mammogram completed; has not scheduled.   7. Ear fullness R: chronic issue with recent exacerbation of BPV.   With nausea, dizziness; intermittent issue.  S/p ear surgery several years ago.  Requesting Valium rx; Jac Canavan prescribed with dizziness.  Vertigo.  +nausea; no vomiting.  Out of Valium now.  Chronic issue; +last week, sitting on couch all day with vertigo.  Decreased hearing.  +nasal congestion recently; onset one week ago.  Eyes running pus.  No fever; mild ST; +coughing; +sneezing.  Mucinex; no other medication. Zyrtec x 1 day.  Took Mucinex yesterday with good relief.    8. Plantar fasciitis: no improvement.  Onset 1.5 years ago.  Has two pair of shoes in the summer and two in the winter.  Heels hurt all day long.  Unable to walk on L heel.    9.  L wrist pain: onset after lifting things to take into sister's house.  Radial aspect and radiates into thumb; hurts to wash hands, take a bath.  Snuffbox has been swollen in past.  Icing  some.  Massage.  R handed.  No n/t in wrist.    Review of Systems  Constitutional: Negative for fever, chills, diaphoresis and fatigue.  HENT: Positive for hearing loss, congestion, rhinorrhea, sneezing and postnasal drip. Negative for ear pain, sore throat, trouble swallowing and voice change.   Respiratory: Negative for cough, shortness of breath, wheezing and stridor.   Cardiovascular: Positive for leg swelling. Negative for chest pain and palpitations.  Musculoskeletal: Positive for myalgias and arthralgias. Negative for joint swelling.  Neurological: Positive for dizziness. Negative for tremors, seizures, syncope, facial asymmetry, speech difficulty, weakness, light-headedness, numbness and headaches.  Psychiatric/Behavioral: Positive for sleep disturbance and dysphoric mood. The patient is nervous/anxious.         Past Medical History  Diagnosis Date  . Fibromyalgia   . HLD (hyperlipidemia)   . Personal history of colonic polyps   . 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 Unspecified sinusitis (chronic)   . Other abnormal glucose   . Swelling of limb   . Tobacco use disorder   . 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 Allergic rhinitis, cause unspecified   . Chicken pox     Past Surgical History  Procedure Laterality Date  . Appendectomy  06/1997  . External ear surgery  09/2008    external - not specified  (RIGHT)  . Breast biopsy  2002    Right  . Colposcopy  10/16/2011    HGSIL pap smear.  Negative.  . Colonoscopy  10/15/2006    [facility].    Prior to Admission medications   Medication Sig Start Date End Date Taking? Authorizing Provider  ascorbic acid (VITAMIN C) 500 MG tablet Take 500 mg by mouth daily.   Yes Historical Provider, MD  aspirin 325 MG tablet Take 325 mg by mouth daily.     Yes Historical Provider, MD  atorvastatin (LIPITOR) 10 MG tablet Take 10 mg by mouth daily. NEED REFILL 90 DAYS, USE [facility] 10/06/12   Yes Ethelda Chick, MD  Cetirizine HCl (ZYRTEC PO) Take 10 mg by mouth.   Yes Historical Provider, MD  Cholecalciferol (VITAMIN D3) 2000 UNITS capsule Take 2,000 Units by mouth daily.     Yes Historical Provider, MD  fluticasone (FLONASE) 50 MCG/ACT nasal spray Place 2 sprays into the nose daily.   Yes Historical Provider, MD  furosemide (LASIX) 20 MG tablet Take 20 mg by mouth 2 (two) times daily. NEED REFILL 90 DAYS, USE [facility] 10/06/12  Yes Ethelda Chick, MD  Glucosamine Sulfate 1000 MG CAPS Take 1 capsule by mouth daily.     Yes Historical Provider, MD  HONEY PO Take by mouth daily.   Yes Historical Provider, MD  Misc Natural Products (OSTEO BI-FLEX JOINT SHIELD) TABS Take 2 tablets by mouth daily. With magnesium    Yes Historical Provider, MD  Multiple Vitamin (MULTIVITAMIN) tablet Take 1 tablet by mouth daily.   Yes Historical Provider, MD  niacin (NIASPAN) 500 MG CR tablet Take 500 mg by mouth daily.     Yes Historical Provider, MD  NON Katharine Look for health - 1 tablet daily    Yes Historical Provider, MD  NON FORMULARY Swiss kirss herbalaxative    Yes Historical Provider, MD  sertraline (ZOLOFT) 50 MG tablet Take 50 mg by mouth daily.     Yes Historical Provider, MD  cyclobenzaprine (FLEXERIL) 10 MG tablet Take 10 mg by mouth 2 (two) times daily as needed.    Historical Provider, MD  Omega-3 Fatty Acids (FISH OIL MAXIMUM STRENGTH) 1200 MG CAPS Take 2 capsules by mouth daily.      Historical Provider, MD  rosuvastatin (CRESTOR) 5 MG tablet Take 5 mg by mouth daily.      Historical Provider, MD  varenicline (CHANTIX CONTINUING MONTH PAK) 1 MG tablet Take 1 mg by mouth as directed.      Historical Provider, MD    Not on File  History   Social History  . Marital Status: Married    Spouse Name: N/A    Number of Children: 2  . Years of Education: N/A   Occupational History  . homemaker    Social History Main Topics  . Smoking status: Former Smoker -- 1.00  packs/day for 40 years    Types: Cigarettes    Quit date: 04/01/2012  . Smokeless tobacco: Not on file     Comment: 7-10 cigarettes for past 40 years   . Alcohol Use: Yes     Comment: occasional  5 drinks per year  . Drug Use: No  . Sexually Active: Yes   Other Topics Concern  . Not on file   Social History Narrative   Marital status: married x 40 years; happily married; no abuse.      Children: 2 sons; no grandchildren.      Lives: with husband.  Employment: unemployed.      Tobacco: quit smoking 03/2012.  Electronic cigarette.       Always uses seat belts.   Smoke alarm and carbon monoxide detector in the home.       No guns.      Caffeine ION:GEXBMW 3 servings per day.    Family History  Problem Relation Age of Onset  . Arthritis    . Coronary artery disease    . Heart failure Father   . Hypertension Father   . Stroke Father   . Hyperlipidemia Father   . Heart failure Brother   . Diabetes Brother   . Diabetes Brother   . Heart disease Brother   . Hyperlipidemia Brother   . Hypertension Brother   . Hypertension Sister   . Depression Sister     Objective:   Physical Exam  Nursing note and vitals reviewed. Constitutional: She is oriented to person, place, and time. She appears well-developed and well-nourished. No distress.  HENT:  Head: Normocephalic and atraumatic.  Right Ear: External ear normal.  Left Ear: External ear normal.  Nose: Mucosal edema and rhinorrhea present. Right sinus exhibits maxillary sinus tenderness. Right sinus exhibits no frontal sinus tenderness. Left sinus exhibits maxillary sinus tenderness. Left sinus exhibits no frontal sinus tenderness.  Mouth/Throat: Oropharynx is clear and moist.  Eyes: Conjunctivae and EOM are normal. Pupils are equal, round, and reactive to light.  Neck: Normal range of motion. Neck supple. No thyromegaly present.  Cardiovascular: Normal rate, regular rhythm and normal heart sounds.  Exam reveals no gallop  and no friction rub.   No murmur heard. Pulmonary/Chest: Effort normal and breath sounds normal. She has no wheezes. She has no rales.  Abdominal: Soft. Bowel sounds are normal. She exhibits no distension. There is no tenderness. There is no rebound and no guarding.  Musculoskeletal:       Left elbow: Normal.       Left wrist: She exhibits decreased range of motion, tenderness and bony tenderness. She exhibits no swelling, no deformity and no laceration.       Right ankle: Normal.       Left ankle: Normal.       Left hand: She exhibits decreased range of motion and tenderness. She exhibits normal capillary refill, no deformity, no laceration and no swelling. Normal sensation noted. Normal strength noted.       Right foot: She exhibits tenderness. She exhibits normal range of motion.       Left foot: She exhibits tenderness. She exhibits normal range of motion.  L WRIST:  +TTP RADIAL ASPECT; NO SWELLING; PAIN WITH FLEXION, EXTENSION, SUPINATION, PRONATION. L HAND: NO SWELLING; +TTP PROXIMAL THUMB. GRIP 5/5.  Lymphadenopathy:    She has no cervical adenopathy.  Neurological: She is alert and oriented to person, place, and time. She has normal reflexes. No cranial nerve deficit. She exhibits normal muscle tone. Coordination normal.  Skin: Skin is warm and dry. No rash noted. She is not diaphoretic. No erythema.  Psychiatric: She has a normal mood and affect. Her behavior is normal. Judgment and thought content normal.    [facility] reading (PRIMARY) by  Dr. Katrinka Blazing.  L FOOT: NAD; L WRIST: NAD     Assessment & Plan:  Pure hypercholesterolemia - Plan: CBC with Differential, CK, Comprehensive metabolic panel, Lipid panel  Other abnormal glucose - Plan: CBC with Differential, Comprehensive metabolic panel, Hemoglobin A1c  Venous stasis  Depression  Fibromyalgia  Left wrist pain -  Plan: DG Wrist Complete Left, meloxicam (MOBIC) 15 MG tablet  Heel pain, left - Plan: DG Foot 2 Views Left,  meloxicam (MOBIC) 15 MG tablet  Benign paroxysmal positional vertigo - Plan: diazepam (VALIUM) 2 MG tablet  Acute maxillary sinusitis - Plan: amoxicillin (AMOXIL) 500 MG capsule   1. Hypercholesterolemia: controlled; obtain labs; refill provided. 2.  Glucose Intolerance: stable; obtain labs; dietary modification, weight loss, exercise. 3. Venous stasis:  Chronic and stable; refill provided; obtain labs. 4. Depression: worsening; increase Zoloft to 100mg  daily as recommended at last visit. 5.  Fibromyalgia: stable; increase Zoloft to 100mg  daily. 6.  L wrist pain/sprain:  New.  Rx for Meloxicam provided.  Wrist splint provided.  Home exercises reviewed.  If no improvement in one month, to call for ortho referral. 7.  L plantar fasciitis/heel pain:  New. Onset 1.5 years ago; rx for Mobic provided; recommend home exercise program; if persists,, recommend podiatry consultation. 8. BPV: recurrent; rx for Valium provided.  Home exercises recommended. Obtain labs; normal neuro exam. 9.  Acute maxillary sinusitis:  New.  Rx for Amoxicillin provided.    Meds ordered this encounter  Medications  . DISCONTD: furosemide (LASIX) 20 MG tablet    Sig: Take 20 mg by mouth 2 (two) times daily. NEED REFILL 90 DAYS, USE [facility]  . DISCONTD: atorvastatin (LIPITOR) 10 MG tablet    Sig: Take 10 mg by mouth daily. NEED REFILL 90 DAYS, USE [facility]  . meloxicam (MOBIC) 15 MG tablet    Sig: Take 1 tablet (15 mg total) by mouth daily.    Dispense:  30 tablet    Refill:  0  . diazepam (VALIUM) 2 MG tablet    Sig: Take 1 tablet (2 mg total) by mouth every 8 (eight) hours as needed (dizziness/vertigo).    Dispense:  30 tablet    Refill:  0  . furosemide (LASIX) 20 MG tablet    Sig: Take 1 tablet (20 mg total) by mouth 2 (two) times daily. NEED REFILL 90 DAYS, USE [facility]    Dispense:  90 tablet    Refill:  2  . atorvastatin (LIPITOR) 10 MG tablet    Sig: Take  1 tablet (10 mg total) by mouth daily. NEED REFILL 90 DAYS, USE [facility]    Dispense:  90 tablet    Refill:  2  . amoxicillin (AMOXIL) 500 MG capsule    Sig: Take 2 capsules (1,000 mg total) by mouth 2 (two) times daily.    Dispense:  40 capsule    Refill:  0

## 2013-10-19 ENCOUNTER — Encounter: Payer: Medicare Other | Admitting: Family Medicine

## 2013-11-03 ENCOUNTER — Ambulatory Visit: Payer: Medicare Other | Admitting: Family Medicine

## 2013-12-14 ENCOUNTER — Other Ambulatory Visit: Payer: Self-pay | Admitting: Family Medicine

## 2014-01-22 LAB — HM MAMMOGRAPHY: HM Mammogram: NORMAL

## 2014-02-15 ENCOUNTER — Ambulatory Visit (INDEPENDENT_AMBULATORY_CARE_PROVIDER_SITE_OTHER): Payer: Medicare Other | Admitting: Family Medicine

## 2014-02-15 ENCOUNTER — Encounter: Payer: Self-pay | Admitting: Family Medicine

## 2014-02-15 VITALS — BP 116/70 | HR 71 | Temp 98.1°F | Resp 16 | Ht 62.0 in | Wt 212.2 lb

## 2014-02-15 DIAGNOSIS — I878 Other specified disorders of veins: Secondary | ICD-10-CM

## 2014-02-15 DIAGNOSIS — Z0000 Encounter for general adult medical examination without abnormal findings: Secondary | ICD-10-CM

## 2014-02-15 DIAGNOSIS — R7309 Other abnormal glucose: Secondary | ICD-10-CM

## 2014-02-15 DIAGNOSIS — E7800 Pure hypercholesterolemia, unspecified: Secondary | ICD-10-CM

## 2014-02-15 DIAGNOSIS — Z01419 Encounter for gynecological examination (general) (routine) without abnormal findings: Secondary | ICD-10-CM

## 2014-02-15 DIAGNOSIS — I872 Venous insufficiency (chronic) (peripheral): Secondary | ICD-10-CM

## 2014-02-15 LAB — CBC WITH DIFFERENTIAL/PLATELET
Basophils Absolute: 0.1 10*3/uL (ref 0.0–0.1)
Basophils Relative: 1 % (ref 0–1)
Eosinophils Absolute: 0.1 10*3/uL (ref 0.0–0.7)
Eosinophils Relative: 2 % (ref 0–5)
HCT: 39.1 % (ref 36.0–46.0)
Hemoglobin: 13.5 g/dL (ref 12.0–15.0)
Lymphocytes Relative: 31 % (ref 12–46)
Lymphs Abs: 1.9 10*3/uL (ref 0.7–4.0)
MCH: 30.8 pg (ref 26.0–34.0)
MCHC: 34.5 g/dL (ref 30.0–36.0)
MCV: 89.3 fL (ref 78.0–100.0)
Monocytes Absolute: 0.5 10*3/uL (ref 0.1–1.0)
Monocytes Relative: 9 % (ref 3–12)
Neutro Abs: 3.5 10*3/uL (ref 1.7–7.7)
Neutrophils Relative %: 57 % (ref 43–77)
Platelets: 223 10*3/uL (ref 150–400)
RBC: 4.38 MIL/uL (ref 3.87–5.11)
RDW: 14.3 % (ref 11.5–15.5)
WBC: 6.1 10*3/uL (ref 4.0–10.5)

## 2014-02-15 LAB — POCT URINALYSIS DIPSTICK
Bilirubin, UA: NEGATIVE
Blood, UA: NEGATIVE
Glucose, UA: NEGATIVE
Ketones, UA: NEGATIVE
Leukocytes, UA: NEGATIVE
Nitrite, UA: NEGATIVE
Protein, UA: NEGATIVE
Spec Grav, UA: 1.01
Urobilinogen, UA: 0.2
pH, UA: 7

## 2014-02-15 MED ORDER — ATORVASTATIN CALCIUM 10 MG PO TABS: 10.0000 mg | ORAL_TABLET | Freq: Every day | ORAL | Status: DC

## 2014-02-15 MED ORDER — FUROSEMIDE 20 MG PO TABS: 20.0000 mg | ORAL_TABLET | Freq: Two times a day (BID) | ORAL | Status: DC

## 2014-02-15 NOTE — Progress Notes (Signed)
Subjective:   This chart was scribed for Carol Honour, MD by [name], [facility] Scribe. This patient was seen in room 21 and the patient's care was started 3:37 PM.    Patient ID: Carol [name], female    DOB: 08/16/1946, 68 y.o.   MRN: [id number]  02/15/2014  Annual Exam and Allergic Rhinitis    HPI   HPI Comments: Carol [name] is a 68 y.o. female who presents to [facility] for a complete physical examination today and ten month follow-up.   Fibromyalgia and depression:  Pt states she stopped taking her Zoloft as she ran out of her prescription. She says the medication was not as effective for her and didn't feel a refill was necessary. She states emotionally she is doing all right.  She admits to 1 episode of bilateral hip pain 1 month that has resolved. She states since onset, she "doesnt move the way I used to".  She reports ongoing, constant tinnitus that has been persistent for some time now and is baseline for her.  She also reports intermittent numbness and tingling in her arms bilaterally which she states is baseline and ongoing for her.  Pt states her bowels are moving as normal. No diarrhea or constipation at this time.  Pt is sleeping well, but states her husband says she snores at night. She says she is not ready for a sleep study at this time.  States she is checking her breasts regularly and typically checks them at the first of every month.  Pt has 3 sisters. Medical problems include high blood pressure and high cholesterol. She has 1 living brother. Second brother died of diabetes and heart problems at the age of 50.  Pt has 1 grandchild. She is moving down to [place] from [place] this summer and she is extremely excited.  Pt does not smoke cigarettes but does smoke electronic cigarettes. Only consumes about 2 glasses of wine a year.  Pt states she is not exercising at this time. "I can't".  She  is currently taking her cholesterol and fluid pill as prescribed and directed.  Patient reports good compliance with medication, good tolerance to medication, and good symptom control.     At this time she denies any fever, HA, dizziness, blurred vision, vomiting, nausea, abdominal pain, rash, weakness, chest pain, palpitations, SOB, or cough. No mouth sores.  Last follow up office visit: 04/06/2013 Last Mammogram: 12/2013- [facility]. Results came back normal TDap: 2012 Last Pap Smear: 2013 Last Dental visit: 2015  Last Eye visit: 2015 Last Colonoscropy: 2013- States she had 3 polyps removed. Performed by Dr. Paulita Fujita. Repeat in 5 years Pneumovax: 2013 Last Zostor Vaccine: 2014 Last Physical Examination:10/06/13   Review of Systems  Constitutional: Positive for unexpected weight change. Negative for fever, chills, diaphoresis, activity change, appetite change and fatigue.  HENT: Positive for tinnitus. Negative for congestion, hearing loss, mouth sores, sneezing and sore throat.   Eyes: Negative for photophobia, redness and visual disturbance.  Respiratory: Negative for cough, shortness of breath, wheezing and stridor.   Cardiovascular: Positive for leg swelling. Negative for chest pain and palpitations.  Gastrointestinal: Negative for nausea, vomiting, abdominal pain, diarrhea, constipation, blood in stool, abdominal distention, anal bleeding and rectal pain.  Endocrine: Negative for cold intolerance, heat intolerance, polydipsia, polyphagia and polyuria.  Genitourinary: Negative for dysuria, frequency, hematuria, flank pain, vaginal bleeding, vaginal discharge, genital sores, vaginal pain and pelvic pain.  Musculoskeletal: Positive for  arthralgias, back pain and myalgias. Negative for gait problem, joint swelling, neck pain and neck stiffness.  Skin: Negative for color change, pallor, rash and wound.  Neurological: Negative for dizziness, tremors, seizures, syncope, facial asymmetry, speech  difficulty, weakness, light-headedness, numbness and headaches.  Hematological: Negative for adenopathy. Does not bruise/bleed easily.  Psychiatric/Behavioral: Negative for suicidal ideas, confusion, sleep disturbance, self-injury and dysphoric mood. The patient is not nervous/anxious.     Past Medical History  Diagnosis Date  . Fibromyalgia   . HLD (hyperlipidemia)   . Personal history of colonic polyps   . 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 Unspecified sinusitis (chronic)   . Other abnormal glucose   . Swelling of limb   . Tobacco use disorder   . 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 Allergic rhinitis, cause unspecified   . Chicken pox    Past Surgical History  Procedure Laterality Date  . Appendectomy  06/1997  . External ear surgery  09/2008    external - not specified  (RIGHT)  . Breast biopsy  2002    Right  . Colposcopy  10/16/2011    HGSIL pap smear.  Negative.  . Colonoscopy  10/15/2012    mutliple polyps.  Outlaw in [place].  Repeat 5 years.    Not on File Current Outpatient Prescriptions  Medication Sig Dispense Refill  . ascorbic acid (VITAMIN C) 500 MG tablet Take 500 mg by mouth daily.      Marland Kitchen atorvastatin (LIPITOR) 10 MG tablet Take 1 tablet (10 mg total) by mouth daily. NEED REFILL 90 DAYS, USE [facility]  90 tablet  2  . Cetirizine HCl (ZYRTEC PO) Take 10 mg by mouth.      . Cholecalciferol (VITAMIN D3) 2000 UNITS capsule Take 2,000 Units by mouth daily.        . diazepam (VALIUM) 2 MG tablet Take 1 tablet (2 mg total) by mouth every 8 (eight) hours as needed (dizziness/vertigo).  30 tablet  0  . fluticasone (FLONASE) 50 MCG/ACT nasal spray Place 2 sprays into the nose daily.      . furosemide (LASIX) 20 MG tablet Take 1 tablet (20 mg total) by mouth 2 (two) times daily. NEED REFILL 90 DAYS, USE [facility]  90 tablet  2  . Glucosamine Sulfate 1000 MG CAPS Take 1 capsule by mouth daily.          . Misc Natural Products (OSTEO BI-FLEX JOINT SHIELD) TABS Take 2 tablets by mouth daily. With magnesium       . Multiple Vitamin (MULTIVITAMIN) tablet Take 1 tablet by mouth daily.      . NON FORMULARY Swiss kirss herbalaxative       . amoxicillin (AMOXIL) 500 MG capsule Take 2 capsules (1,000 mg total) by mouth 2 (two) times daily.  40 capsule  0  . aspirin 325 MG tablet Take 325 mg by mouth daily.        . cyclobenzaprine (FLEXERIL) 10 MG tablet Take 10 mg by mouth 2 (two) times daily as needed.      . HONEY PO Take by mouth daily.      . meloxicam (MOBIC) 15 MG tablet Take 1 tablet (15 mg total) by mouth daily.  30 tablet  0  . niacin (NIASPAN) 500 MG CR tablet Take 500 mg by mouth daily.        . NON FORMULARY Chew for health - 1 tablet daily       .  Omega-3 Fatty Acids (FISH OIL MAXIMUM STRENGTH) 1200 MG CAPS Take 2 capsules by mouth daily.        . rosuvastatin (CRESTOR) 5 MG tablet Take 5 mg by mouth daily.        . sertraline (ZOLOFT) 50 MG tablet Take 50 mg by mouth daily.        . varenicline (CHANTIX CONTINUING MONTH PAK) 1 MG tablet Take 1 mg by mouth as directed.         No current facility-administered medications for this visit.   History   Social History  . Marital Status: Married    Spouse Name: N/A    Number of Children: 2  . Years of Education: N/A   Occupational History  . homemaker    Social History Main Topics  . Smoking status: Former Smoker -- 1.00 packs/day for 40 years    Types: Cigarettes    Quit date: 04/01/2012  . Smokeless tobacco: Not on file     Comment: 7-10 cigarettes for past 40 years   . Alcohol Use: Yes     Comment: occasional  5 drinks per year  . Drug Use: No  . Sexual Activity: Yes   Other Topics Concern  . Not on file   Social History Narrative   Marital status: married x 61 years; happily married; no abuse.      Children: 2 sons; one granddaughter (57).      Lives: with husband.      Employment: unemployed.      Tobacco: quit  smoking 03/2012.  Electronic cigarette.      Alcohol: socially; 2 glasses per year.      Exercise:  None/sporadic       Always uses seat belts.   Smoke alarm and carbon monoxide detector in the home.       No guns.      Caffeine IBB:CWUGQB 3 servings per day.   Family History  Problem Relation Age of Onset  . Arthritis    . Coronary artery disease    . Heart failure Father   . Hypertension Father   . Stroke Father   . Hyperlipidemia Father   . Heart failure Brother   . Diabetes Brother   . Heart disease Brother   . Diabetes Brother   . Heart disease Brother   . Hyperlipidemia Brother   . Hypertension Brother   . Hypertension Sister   . Depression Sister   . Hypertension Sister   . Hyperlipidemia Sister         Objective:    BP 116/70  Pulse 71  Temp(Src) 98.1 F (36.7 C) (Oral)  Resp 16  Ht _0  (1.575 m)  Wt 212 lb 3.2 oz (96.253 kg)  BMI 38.80 kg/m2  SpO2 95%  Physical Exam  Nursing note and vitals reviewed. Constitutional: She is oriented to person, place, and time. She appears well-developed and well-nourished. No distress.  HENT:  Head: Normocephalic and atraumatic.  Right Ear: External ear normal.  Left Ear: External ear normal.  Nose: Nose normal.  Mouth/Throat: Oropharynx is clear and moist. No oropharyngeal exudate.  Eyes: Conjunctivae and EOM are normal. Pupils are equal, round, and reactive to light. Right eye exhibits no discharge. Left eye exhibits no discharge. No scleral icterus.  Neck: Normal range of motion. Neck supple. No thyromegaly present.  Cardiovascular: Normal rate, regular rhythm, normal heart sounds and intact distal pulses.  Exam reveals no gallop.   No murmur heard. Pulmonary/Chest: Effort  normal and breath sounds normal. She has no wheezes. She has no rales. Right breast exhibits no inverted nipple, no mass, no nipple discharge, no skin change and no tenderness. Left breast exhibits no inverted nipple, no mass, no nipple discharge,  no skin change and no tenderness. Breasts are symmetrical.  Abdominal: Soft. Bowel sounds are normal. She exhibits no distension and no mass. There is no tenderness. There is no rebound and no guarding.  Genitourinary: Vagina normal and uterus normal. No breast swelling, tenderness, discharge or bleeding. There is no rash, tenderness or lesion on the right labia. There is no rash, tenderness or lesion on the left labia. Cervix exhibits no motion tenderness, no discharge and no friability. Right adnexum displays no mass, no tenderness and no fullness. Left adnexum displays no mass, no tenderness and no fullness.  Musculoskeletal: Normal range of motion. She exhibits tenderness. She exhibits no edema.  Lymphadenopathy:    She has no cervical adenopathy.  Neurological: She is alert and oriented to person, place, and time. She has normal reflexes. She displays normal reflexes.  Skin: Skin is warm and dry. No rash noted. She is not diaphoretic. No erythema. No pallor.  Psychiatric: She has a normal mood and affect. Her behavior is normal. Judgment and thought content normal.   Results for orders placed in visit on 04/06/13  CBC WITH DIFFERENTIAL      Result Value Ref Range   WBC 4.6  4.0 - 10.5 K/uL   RBC 4.49  3.87 - 5.11 MIL/uL   Hemoglobin 13.4  12.0 - 15.0 g/dL   HCT 38.8  36.0 - 46.0 %   MCV 86.4  78.0 - 100.0 fL   MCH 29.8  26.0 - 34.0 pg   MCHC 34.5  30.0 - 36.0 g/dL   RDW 14.5  11.5 - 15.5 %   Platelets 214  150 - 400 K/uL   Neutrophils Relative % 53  43 - 77 %   Neutro Abs 2.4  1.7 - 7.7 K/uL   Lymphocytes Relative 31  12 - 46 %   Lymphs Abs 1.4  0.7 - 4.0 K/uL   Monocytes Relative 13 (*) 3 - 12 %   Monocytes Absolute 0.6  0.1 - 1.0 K/uL   Eosinophils Relative 2  0 - 5 %   Eosinophils Absolute 0.1  0.0 - 0.7 K/uL   Basophils Relative 1  0 - 1 %   Basophils Absolute 0.1  0.0 - 0.1 K/uL   Smear Review Criteria for review not met    CK      Result Value Ref Range   Total CK 73  7  - 177 U/L  COMPREHENSIVE METABOLIC PANEL      Result Value Ref Range   Sodium 140  135 - 145 mEq/L   Potassium 4.3  3.5 - 5.3 mEq/L   Chloride 102  96 - 112 mEq/L   CO2 29  19 - 32 mEq/L   Glucose, Bld 101 (*) 70 - 99 mg/dL   BUN 11  6 - 23 mg/dL   Creat 1.02  0.50 - 1.10 mg/dL   Total Bilirubin 0.5  0.3 - 1.2 mg/dL   Alkaline Phosphatase 72  39 - 117 U/L   AST 22  0 - 37 U/L   ALT 25  0 - 35 U/L   Total Protein 6.9  6.0 - 8.3 g/dL   Albumin 4.3  3.5 - 5.2 g/dL   Calcium 9.7  8.4 -  10.5 mg/dL  HEMOGLOBIN A1C      Result Value Ref Range   Hemoglobin A1C 5.7 (*) <5.7 %   Mean Plasma Glucose 117 (*) <117 mg/dL  LIPID PANEL      Result Value Ref Range   Cholesterol 247 (*) 0 - 200 mg/dL   Triglycerides 155 (*) <150 mg/dL   HDL 52  >39 mg/dL   Total CHOL/HDL Ratio 4.8     VLDL 31  0 - 40 mg/dL   LDL Cholesterol 164 (*) 0 - 99 mg/dL       Assessment & Plan:  Pure hypercholesterolemia - Plan: COMPLETE METABOLIC PANEL WITH GFR, Lipid panel  Other abnormal glucose - Plan: COMPLETE METABOLIC PANEL WITH GFR, Hemoglobin A1c  Routine general medical examination at a health care facility - Plan: EKG 12-Lead, POCT urinalysis dipstick, CBC with Differential, COMPLETE METABOLIC PANEL WITH GFR  Routine gynecological examination - Plan: Pap IG w/ reflex to HPV when ASC-U  Venous stasis  1. Annual wellness exam:  Anticipatory guidance provided -- weight loss, exercise.  Pap smear obtained; recent mammogram at [facility].  Colonoscopy UTD.  Immunizations UTD.  Low fall risk.  Independent with ADLs. No hearing loss.  No depression currently.  2.  Gynecological exam: completed; pap smear obtained; mammogram UTD. 3.  Glucose intolerance: stable; weight gain in past year; obtain labs. 4.  Hypercholesterolemia: controlled; obtain labs; refill provided. 5. Venous stasis: chronic; refill provided; obtain EKG; obtain records of previous cardiolite to evaluate EF.  If no EF recorded, refer for echo.  6.  Fibromyalgia: stable off of Zoloft.  Pt declined rx for other medication.   No orders of the defined types were placed in this encounter.    No Follow-up on file.   I personally performed the services described in this documentation, which was scribed in my presence. The recorded information has been reviewed and is accurate.   [name], M.D.  [facility] [address] [place], [place]  [address] [phone] phone [phone] fax

## 2014-02-16 LAB — COMPLETE METABOLIC PANEL WITH GFR
ALT: 22 U/L (ref 0–35)
AST: 19 U/L (ref 0–37)
Albumin: 4.5 g/dL (ref 3.5–5.2)
Alkaline Phosphatase: 66 U/L (ref 39–117)
BUN: 10 mg/dL (ref 6–23)
CO2: 30 mEq/L (ref 19–32)
Calcium: 9.6 mg/dL (ref 8.4–10.5)
Chloride: 101 mEq/L (ref 96–112)
Creat: 1.12 mg/dL — ABNORMAL HIGH (ref 0.50–1.10)
GFR, Est African American: 59 mL/min — ABNORMAL LOW
GFR, Est Non African American: 51 mL/min — ABNORMAL LOW
Glucose, Bld: 92 mg/dL (ref 70–99)
Potassium: 4.3 mEq/L (ref 3.5–5.3)
Sodium: 140 mEq/L (ref 135–145)
Total Bilirubin: 0.7 mg/dL (ref 0.2–1.2)
Total Protein: 7 g/dL (ref 6.0–8.3)

## 2014-02-16 LAB — HEMOGLOBIN A1C
Hgb A1c MFr Bld: 5.8 % — ABNORMAL HIGH (ref <5.7)
Mean Plasma Glucose: 120 mg/dL — ABNORMAL HIGH (ref <117)

## 2014-02-16 LAB — PAP IG W/ RFLX HPV ASCU

## 2014-02-16 LAB — LIPID PANEL
Cholesterol: 161 mg/dL (ref 0–200)
HDL: 55 mg/dL (ref >39)
LDL Cholesterol: 68 mg/dL (ref 0–99)
Total CHOL/HDL Ratio: 2.9 Ratio
Triglycerides: 188 mg/dL — ABNORMAL HIGH (ref <150)
VLDL: 38 mg/dL (ref 0–40)

## 2014-02-17 ENCOUNTER — Encounter: Payer: Self-pay | Admitting: *Deleted

## 2014-04-02 ENCOUNTER — Encounter: Payer: Self-pay | Admitting: Family Medicine

## 2014-04-28 DIAGNOSIS — Z0271 Encounter for disability determination: Secondary | ICD-10-CM

## 2014-08-18 ENCOUNTER — Ambulatory Visit: Payer: Medicare Other | Admitting: Family Medicine

## 2014-08-25 ENCOUNTER — Ambulatory Visit: Payer: Medicare Other | Admitting: Family Medicine

## 2014-09-08 ENCOUNTER — Ambulatory Visit: Payer: Medicare Other | Admitting: Family Medicine

## 2014-09-20 ENCOUNTER — Encounter: Payer: Self-pay | Admitting: Family Medicine

## 2014-09-20 ENCOUNTER — Ambulatory Visit (INDEPENDENT_AMBULATORY_CARE_PROVIDER_SITE_OTHER): Payer: Medicare Other | Admitting: Family Medicine

## 2014-09-20 VITALS — BP 108/68 | HR 62 | Temp 98.5°F | Resp 16 | Ht 62.5 in | Wt 209.8 lb

## 2014-09-20 DIAGNOSIS — E7800 Pure hypercholesterolemia, unspecified: Secondary | ICD-10-CM

## 2014-09-20 DIAGNOSIS — F418 Other specified anxiety disorders: Secondary | ICD-10-CM

## 2014-09-20 DIAGNOSIS — G2581 Restless legs syndrome: Secondary | ICD-10-CM

## 2014-09-20 DIAGNOSIS — R7309 Other abnormal glucose: Secondary | ICD-10-CM

## 2014-09-20 DIAGNOSIS — F32A Depression, unspecified: Secondary | ICD-10-CM

## 2014-09-20 DIAGNOSIS — F329 Major depressive disorder, single episode, unspecified: Secondary | ICD-10-CM

## 2014-09-20 DIAGNOSIS — Z23 Encounter for immunization: Secondary | ICD-10-CM

## 2014-09-20 DIAGNOSIS — I878 Other specified disorders of veins: Secondary | ICD-10-CM

## 2014-09-20 DIAGNOSIS — F419 Anxiety disorder, unspecified: Secondary | ICD-10-CM

## 2014-09-20 LAB — CBC WITH DIFFERENTIAL/PLATELET
Basophils Absolute: 0.1 10*3/uL (ref 0.0–0.1)
Basophils Relative: 1 % (ref 0–1)
Eosinophils Absolute: 0.2 10*3/uL (ref 0.0–0.7)
Eosinophils Relative: 3 % (ref 0–5)
HCT: 40 % (ref 36.0–46.0)
Hemoglobin: 13.8 g/dL (ref 12.0–15.0)
Lymphocytes Relative: 30 % (ref 12–46)
Lymphs Abs: 1.7 10*3/uL (ref 0.7–4.0)
MCH: 30.3 pg (ref 26.0–34.0)
MCHC: 34.5 g/dL (ref 30.0–36.0)
MCV: 87.7 fL (ref 78.0–100.0)
MPV: 10.8 fL (ref 9.4–12.4)
Monocytes Absolute: 0.5 10*3/uL (ref 0.1–1.0)
Monocytes Relative: 9 % (ref 3–12)
Neutro Abs: 3.2 10*3/uL (ref 1.7–7.7)
Neutrophils Relative %: 57 % (ref 43–77)
Platelets: 213 10*3/uL (ref 150–400)
RBC: 4.56 MIL/uL (ref 3.87–5.11)
RDW: 13.8 % (ref 11.5–15.5)
WBC: 5.6 10*3/uL (ref 4.0–10.5)

## 2014-09-20 LAB — COMPLETE METABOLIC PANEL WITH GFR
ALT: 24 U/L (ref 0–35)
AST: 20 U/L (ref 0–37)
Albumin: 4.4 g/dL (ref 3.5–5.2)
Alkaline Phosphatase: 77 U/L (ref 39–117)
BUN: 9 mg/dL (ref 6–23)
CO2: 30 mEq/L (ref 19–32)
Calcium: 9.5 mg/dL (ref 8.4–10.5)
Chloride: 103 mEq/L (ref 96–112)
Creat: 1.02 mg/dL (ref 0.50–1.10)
GFR, Est African American: 66 mL/min
GFR, Est Non African American: 57 mL/min — ABNORMAL LOW
Glucose, Bld: 113 mg/dL — ABNORMAL HIGH (ref 70–99)
Potassium: 4.2 mEq/L (ref 3.5–5.3)
Sodium: 142 mEq/L (ref 135–145)
Total Bilirubin: 0.7 mg/dL (ref 0.2–1.2)
Total Protein: 7 g/dL (ref 6.0–8.3)

## 2014-09-20 LAB — LIPID PANEL
Cholesterol: 150 mg/dL (ref 0–200)
HDL: 49 mg/dL (ref >39)
LDL Cholesterol: 68 mg/dL (ref 0–99)
Total CHOL/HDL Ratio: 3.1 Ratio
Triglycerides: 165 mg/dL — ABNORMAL HIGH (ref <150)
VLDL: 33 mg/dL (ref 0–40)

## 2014-09-20 LAB — IRON: Iron: 91 ug/dL (ref 42–145)

## 2014-09-20 LAB — FERRITIN: Ferritin: 51 ng/mL (ref 10–291)

## 2014-09-20 LAB — HEMOGLOBIN A1C
Hgb A1c MFr Bld: 6 % — ABNORMAL HIGH (ref <5.7)
Mean Plasma Glucose: 126 mg/dL — ABNORMAL HIGH (ref <117)

## 2014-09-20 MED ORDER — FLUOXETINE HCL 20 MG PO TABS: 20.0000 mg | ORAL_TABLET | Freq: Every day | ORAL | Status: DC

## 2014-09-20 MED ORDER — ROPINIROLE HCL 0.25 MG PO TABS: 0.2500 mg | ORAL_TABLET | Freq: Every day | ORAL | Status: DC

## 2014-09-20 NOTE — Progress Notes (Addendum)
Subjective:    Patient ID: Carol [name], female    DOB: 01/25/1946, 68 y.o.   MRN: [id number]  09/20/2014  Follow-up; pure hypercholesterolemia; and disturbance with sleep   HPI This 68 y.o. female presents for six month follow-up:  1.  Insomnia:  Gets retless legs at night; keeping awake.  Some nights not sleeping at all.  Also having mind racing.  Started two years ago.  Stopped Zoloft because not turning off mind.  Admits to sadness and anxiety; multiple family stressors without acute worsening.   2.  Hyperlipidemia: Patient reports good compliance with medication, good tolerance to medication, and good symptom control.  No changes to management made at last visit.  3. Glucose Intolerance:  Weight down three pounds from last visit six months ago. Denies exercise.  Monitoring sugar intake.    4. Venous stasis: stable; no worsening swelling since last visit.  Patient reports good compliance with medication, good tolerance to medication, and good symptom control.   Denies CP/palp/SOB/orthopnea.    Review of Systems  Constitutional: Negative for fever, chills, diaphoresis and fatigue.  Eyes: Negative for visual disturbance.  Respiratory: Negative for cough and shortness of breath.   Cardiovascular: Positive for leg swelling. Negative for chest pain and palpitations.  Gastrointestinal: Negative for nausea, vomiting, abdominal pain, diarrhea and constipation.  Endocrine: Negative for cold intolerance, heat intolerance, polydipsia, polyphagia and polyuria.  Neurological: Negative for dizziness, tremors, seizures, syncope, facial asymmetry, speech difficulty, weakness, light-headedness, numbness and headaches.  Psychiatric/Behavioral: Positive for sleep disturbance and dysphoric mood. Negative for suicidal ideas and self-injury. The patient is nervous/anxious.     Past Medical History  Diagnosis Date  . Fibromyalgia   . HLD (hyperlipidemia)   . Personal history of colonic polyps    . 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 Unspecified sinusitis (chronic)   . Other abnormal glucose   . Swelling of limb   . Tobacco use disorder   . 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 Allergic rhinitis, cause unspecified   . Chicken pox   . Cataract    Past Surgical History  Procedure Laterality Date  . Appendectomy  06/1997  . External ear surgery  09/2008    external - not specified  (RIGHT)  . Breast biopsy  2002    Right  . Colposcopy  10/16/2011    HGSIL pap smear.  Negative.  . Colonoscopy  10/15/2012    mutliple polyps.  Outlaw in [place].  Repeat 5 years.   No Known Allergies      Objective:    BP 108/68 mmHg  Pulse 62  Temp(Src) 98.5 F (36.9 C) (Oral)  Resp 16  Ht 5' 2.5" (1.588 m)  Wt 209 lb 12.8 oz (95.165 kg)  BMI 37.74 kg/m2  SpO2 95% Physical Exam  Constitutional: She is oriented to person, place, and time. She appears well-developed and well-nourished. No distress.  HENT:  Head: Normocephalic and atraumatic.  Right Ear: External ear normal.  Left Ear: External ear normal.  Nose: Nose normal.  Mouth/Throat: Oropharynx is clear and moist.  Eyes: Conjunctivae and EOM are normal. Pupils are equal, round, and reactive to light.  Neck: Normal range of motion. Neck supple. Carotid bruit is not present. No thyromegaly present.  Cardiovascular: Normal rate, regular rhythm, normal heart sounds and intact distal pulses.  Exam reveals no gallop and no friction rub.   No murmur heard. Trace non-pitting edema BLE.  Pulmonary/Chest: Effort normal and  breath sounds normal. She has no wheezes. She has no rales.  Abdominal: Soft. Bowel sounds are normal. She exhibits no distension and no mass. There is no tenderness. There is no rebound and no guarding.  Musculoskeletal: She exhibits edema.  Lymphadenopathy:    She has no cervical adenopathy.  Neurological: She is alert and oriented to person, place, and time. No cranial  nerve deficit.  Skin: Skin is warm and dry. No rash noted. She is not diaphoretic. No erythema. No pallor.  Psychiatric: She has a normal mood and affect. Her behavior is normal.   PREVNAR 13 ADMINISTERED.     Assessment & Plan:   1. Pure hypercholesterolemia   2. Abnormal blood sugar   3. Restless leg syndrome   4. Venous stasis   5. Anxiety and depression   6. Need for prophylactic vaccination against Streptococcus pneumoniae (pneumococcus)      1.  Hyperlipidemia: controlled; obtain labs; continue current medications. 2.  Glucose intolerance: stable; obtain labs; continue with dietary modification. 3.  RLS: New. Obtain labs to rule out secondary causes of RLS; start Requip 0.25mg  qhs.  4.  Venous stasis: stable; continue Lasix; recommend compression stockings. 5.  Anxiety and depression: uncontrolled; start Prozac 20mg  q am.   6. S/p Prevnar 13.    Meds ordered this encounter  Medications  . DISCONTD: rOPINIRole (REQUIP) 0.25 MG tablet    Sig: Take 1-2 tablets (0.25-0.5 mg total) by mouth at bedtime.    Dispense:  180 tablet    Refill:  1  . FLUoxetine (PROZAC) 20 MG tablet    Sig: Take 1 tablet (20 mg total) by mouth daily.    Dispense:  30 tablet    Refill:  5  . rOPINIRole (REQUIP) 0.25 MG tablet    Sig: Take 1-2 tablets (0.25-0.5 mg total) by mouth at bedtime.    Dispense:  60 tablet    Refill:  1    Return in about 6 months (around 03/22/2015) for complete physical examiniation.    [name], M.D.  [facility] [address] [place], [place]  [address] [phone] phone [phone] fax

## 2014-09-20 NOTE — Patient Instructions (Signed)
Restless Legs Syndrome Restless legs syndrome is a movement disorder. It may also be called a sensorimotor disorder.  CAUSES  No one knows what specifically causes restless legs syndrome, but it tends to run in families. It is also more common in people with low iron, in pregnancy, in people who need dialysis, and those with nerve damage (neuropathy).Some medications may make restless legs syndrome worse.Those medications include drugs to treat high blood pressure, some heart conditions, nausea, colds, allergies, and depression. SYMPTOMS Symptoms include uncomfortable sensations in the legs. These leg sensations are worse during periods of inactivity or rest. They are also worse while sitting or lying down. Individuals that have the disorder describe sensations in the legs that feel like:  Pulling.  Drawing.  Crawling.  Worming.  Boring.  Tingling.  Pins and needles.  Prickling.  Pain. The sensations are usually accompanied by an overwhelming urge to move the legs. Sudden muscle jerks may also occur. Movement provides temporary relief from the discomfort. In rare cases, the arms may also be affected. Symptoms may interfere with going to sleep (sleep onset insomnia). Restless legs syndrome may also be related to periodic limb movement disorder (PLMD). PLMD is another more common motor disorder. It also causes interrupted sleep. The symptoms from PLMD usually occur most often when you are awake. TREATMENT  Treatment for restless legs syndrome is symptomatic. This means that the symptoms are treated.   Massage and cold compresses may provide temporary relief.  Walk, stretch, or take a cold or hot bath.  Get regular exercise and a good night's sleep.  Avoid caffeine, alcohol, nicotine, and medications that can make it worse.  Do activities that provide mental stimulation like discussions, needlework, and video games. These may be helpful if you are not able to walk or stretch. Some  medications are effective in relieving the symptoms. However, many of these medications have side effects. Ask your caregiver about medications that may help your symptoms. Correcting iron deficiency may improve symptoms for some patients. Document Released: 09/21/2002 Document Revised: 02/15/2014 Document Reviewed: 12/28/2010 ExitCare Patient Information 2015 [employer]. This information is not intended to replace advice given to you by your health care provider. Make sure you discuss any questions you have with your health care provider.  

## 2014-11-04 ENCOUNTER — Telehealth: Payer: Self-pay

## 2014-11-04 MED ORDER — FUROSEMIDE 20 MG PO TABS: 20.0000 mg | ORAL_TABLET | Freq: Two times a day (BID) | ORAL | Status: DC

## 2014-11-04 NOTE — Telephone Encounter (Signed)
furosemide (LASIX) 20 MG tablet  [id number] - [place] [address], [place]  Needs  A 90 day supply today   [phone]

## 2014-11-04 NOTE — Telephone Encounter (Signed)
Refill sent to the pharmacy 

## 2015-02-14 ENCOUNTER — Telehealth: Payer: Self-pay

## 2015-02-14 MED ORDER — ATORVASTATIN CALCIUM 10 MG PO TABS: 10.0000 mg | ORAL_TABLET | Freq: Every day | ORAL | Status: DC

## 2015-02-14 NOTE — Telephone Encounter (Signed)
Sent!

## 2015-02-14 NOTE — Telephone Encounter (Signed)
Patient is changing pharmacy to [place] - [phone] 90 Day Script atorvastatin (LIPITOR) 10 MG tablet   [phone]

## 2015-03-30 ENCOUNTER — Encounter: Payer: Medicare Other | Admitting: Family Medicine

## 2015-04-22 ENCOUNTER — Other Ambulatory Visit: Payer: Self-pay

## 2015-04-22 DIAGNOSIS — G2581 Restless legs syndrome: Secondary | ICD-10-CM

## 2015-04-22 MED ORDER — ROPINIROLE HCL 0.25 MG PO TABS: 0.2500 mg | ORAL_TABLET | Freq: Every day | ORAL | Status: DC

## 2015-04-22 NOTE — Telephone Encounter (Signed)
Pharm sent req for 90 day supply of ropinirole. Dr Tamala Julian, pt hasn't been in since last Dec, but has an appt sch w/you on 05/06/15. Can we RF for 90 days?

## 2015-04-28 ENCOUNTER — Other Ambulatory Visit: Payer: Self-pay | Admitting: Physician Assistant

## 2015-05-06 ENCOUNTER — Encounter: Payer: Self-pay | Admitting: Family Medicine

## 2015-05-06 DIAGNOSIS — M797 Fibromyalgia: Secondary | ICD-10-CM | POA: Insufficient documentation

## 2015-05-06 DIAGNOSIS — R7303 Prediabetes: Secondary | ICD-10-CM | POA: Insufficient documentation

## 2015-05-06 NOTE — Progress Notes (Deleted)
Subjective:    Patient ID: Carol [name], female    DOB: 06-10-1946, 69 y.o.   MRN: [id number]  05/06/2015  No chief complaint on file.   HPI This 69 y.o. female presents for Annual Wellness Examination.  Last physical: Pap smear: Mammogram: Colonoscopy: Bone density: TDAP: Pneumovax: Zostavax: Influenza: Eye exam: Dental exam:  Hyperlipidemia:  Glucose Intolerance:  Fibromyalgia:  Depression:   Review of Systems  Constitutional: Negative for fever, chills, diaphoresis, activity change, appetite change, fatigue and unexpected weight change.  HENT: Negative for congestion, dental problem, drooling, ear discharge, ear pain, facial swelling, hearing loss, mouth sores, nosebleeds, postnasal drip, rhinorrhea, sinus pressure, sneezing, sore throat, tinnitus, trouble swallowing and voice change.   Eyes: Negative for photophobia, pain, discharge, redness, itching and visual disturbance.  Respiratory: Negative for apnea, cough, choking, chest tightness, shortness of breath, wheezing and stridor.   Cardiovascular: Negative for chest pain, palpitations and leg swelling.  Gastrointestinal: Negative for nausea, vomiting, abdominal pain, diarrhea, constipation, blood in stool, abdominal distention, anal bleeding and rectal pain.  Endocrine: Negative for cold intolerance, heat intolerance, polydipsia, polyphagia and polyuria.  Genitourinary: Negative for dysuria, urgency, frequency, hematuria, flank pain, decreased urine volume, vaginal bleeding, vaginal discharge, enuresis, difficulty urinating, genital sores, vaginal pain, menstrual problem, pelvic pain and dyspareunia.  Musculoskeletal: Negative for myalgias, back pain, joint swelling, arthralgias, gait problem, neck pain and neck stiffness.  Skin: Negative for color change, pallor, rash and wound.  Allergic/Immunologic: Negative for environmental allergies, food allergies and immunocompromised state.  Neurological: Negative for  dizziness, tremors, seizures, syncope, facial asymmetry, speech difficulty, weakness, light-headedness, numbness and headaches.  Hematological: Negative for adenopathy. Does not bruise/bleed easily.  Psychiatric/Behavioral: Negative for suicidal ideas, hallucinations, behavioral problems, confusion, sleep disturbance, self-injury, dysphoric mood, decreased concentration and agitation. The patient is not nervous/anxious and is not hyperactive.     Past Medical History  Diagnosis Date  . Fibromyalgia   . HLD (hyperlipidemia)   . Personal history of colonic polyps   . 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 Unspecified sinusitis (chronic)   . Other abnormal glucose   . Swelling of limb   . Tobacco use disorder   . 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 Allergic rhinitis, cause unspecified   . Chicken pox   . Cataract    Past Surgical History  Procedure Laterality Date  . Appendectomy  06/1997  . External ear surgery  09/2008    external - not specified  (RIGHT)  . Breast biopsy  2002    Right  . Colposcopy  10/16/2011    HGSIL pap smear.  Negative.  . Colonoscopy  10/15/2012    mutliple polyps.  Outlaw in [place].  Repeat 5 years.   No Known Allergies Current Outpatient Prescriptions  Medication Sig Dispense Refill  . ascorbic acid (VITAMIN C) 500 MG tablet Take 500 mg by mouth daily.    Marland Kitchen aspirin 325 MG tablet Take 325 mg by mouth daily.      Marland Kitchen atorvastatin (LIPITOR) 10 MG tablet Take 1 tablet (10 mg total) by mouth daily. NEED REFILL 90 DAYS, USE [facility] 90 tablet 0  . Cetirizine HCl (ZYRTEC PO) Take 10 mg by mouth.    . Cholecalciferol (VITAMIN D3) 2000 UNITS capsule Take 2,000 Units by mouth daily.      . diazepam (VALIUM) 2 MG tablet Take 1 tablet (2 mg total) by mouth every 8 (eight) hours as needed (dizziness/vertigo). 30 tablet  0  . FLUoxetine (PROZAC) 20 MG tablet Take 1 tablet (20 mg total) by mouth daily. 30 tablet 5    . fluticasone (FLONASE) 50 MCG/ACT nasal spray Place 2 sprays into the nose daily.    . furosemide (LASIX) 20 MG tablet TAKE ONE (1) TABLET BY MOUTH TWO (2) TIMES DAILY.  "OV NEEDED" 180 tablet 0  . Glucosamine Sulfate 1000 MG CAPS Take 1 capsule by mouth daily.      . meloxicam (MOBIC) 15 MG tablet Take 1 tablet (15 mg total) by mouth daily. (Patient not taking: Reported on 09/20/2014) 30 tablet 0  . Misc Natural Products (OSTEO BI-FLEX JOINT SHIELD) TABS Take 2 tablets by mouth daily. With magnesium     . Multiple Vitamin (MULTIVITAMIN) tablet Take 1 tablet by mouth daily.    . NON FORMULARY Swiss kirss herbalaxative     . rOPINIRole (REQUIP) 0.25 MG tablet Take 1-2 tablets (0.25-0.5 mg total) by mouth at bedtime. 180 tablet 0  . sertraline (ZOLOFT) 50 MG tablet Take 50 mg by mouth daily.       No current facility-administered medications for this visit.   History   Social History  . Marital Status: Married    Spouse Name: N/A  . Number of Children: 2  . Years of Education: N/A   Occupational History  . homemaker    Social History Main Topics  . Smoking status: Former Smoker -- 1.00 packs/day for 40 years    Types: Cigarettes    Quit date: 04/01/2012  . Smokeless tobacco: Not on file     Comment: 7-10 cigarettes for past 40 years   . Alcohol Use: Yes     Comment: occasional  5 drinks per year  . Drug Use: No  . Sexual Activity: Yes   Other Topics Concern  . Not on file   Social History Narrative   Marital status: married x 52 years; happily married; no abuse.      Children: 2 sons; one granddaughter (9).      Lives: with husband.      Employment: unemployed.      Tobacco: quit smoking 03/2012.  Electronic cigarette.      Alcohol: socially; 2 glasses per year.      Exercise:  None/sporadic       Always uses seat belts.   Smoke alarm and carbon monoxide detector in the home.       No guns.      Caffeine XIP:JASNKN 3 servings per day.   Family History  Problem  Relation Age of Onset  . Arthritis    . Coronary artery disease    . Heart failure Father   . Hypertension Father   . Stroke Father   . Hyperlipidemia Father   . Heart failure Brother   . Diabetes Brother   . Heart disease Brother   . Diabetes Brother   . Heart disease Brother   . Hyperlipidemia Brother   . Hypertension Brother   . Hypertension Sister   . Depression Sister   . Hypertension Sister   . Hyperlipidemia Sister        Objective:    There were no vitals taken for this visit. Physical Exam  Constitutional: She is oriented to person, place, and time. She appears well-developed and well-nourished. No distress.  HENT:  Head: Normocephalic and atraumatic.  Right Ear: External ear normal.  Left Ear: External ear normal.  Nose: Nose normal.  Mouth/Throat: Oropharynx is clear and moist.  Eyes: Conjunctivae and EOM are normal. Pupils are equal, round, and reactive to light.  Neck: Normal range of motion and full passive range of motion without pain. Neck supple. No JVD present. Carotid bruit is not present. No thyromegaly present.  Cardiovascular: Normal rate, regular rhythm and normal heart sounds.  Exam reveals no gallop and no friction rub.   No murmur heard. Pulmonary/Chest: Effort normal and breath sounds normal. She has no wheezes. She has no rales.  Abdominal: Soft. Bowel sounds are normal. She exhibits no distension and no mass. There is no tenderness. There is no rebound and no guarding.  Musculoskeletal:       Right shoulder: Normal.       Left shoulder: Normal.       Cervical back: Normal.  Lymphadenopathy:    She has no cervical adenopathy.  Neurological: She is alert and oriented to person, place, and time. She has normal reflexes. No cranial nerve deficit. She exhibits normal muscle tone. Coordination normal.  Skin: Skin is warm and dry. No rash noted. She is not diaphoretic. No erythema. No pallor.  Psychiatric: She has a normal mood and affect. Her  behavior is normal. Judgment and thought content normal.  Nursing note and vitals reviewed.  Results for orders placed or performed in visit on 09/20/14  CBC with Differential  Result Value Ref Range   WBC 5.6 4.0 - 10.5 K/uL   RBC 4.56 3.87 - 5.11 MIL/uL   Hemoglobin 13.8 12.0 - 15.0 g/dL   HCT 40.0 36.0 - 46.0 %   MCV 87.7 78.0 - 100.0 fL   MCH 30.3 26.0 - 34.0 pg   MCHC 34.5 30.0 - 36.0 g/dL   RDW 13.8 11.5 - 15.5 %   Platelets 213 150 - 400 K/uL   MPV 10.8 9.4 - 12.4 fL   Neutrophils Relative % 57 43 - 77 %   Neutro Abs 3.2 1.7 - 7.7 K/uL   Lymphocytes Relative 30 12 - 46 %   Lymphs Abs 1.7 0.7 - 4.0 K/uL   Monocytes Relative 9 3 - 12 %   Monocytes Absolute 0.5 0.1 - 1.0 K/uL   Eosinophils Relative 3 0 - 5 %   Eosinophils Absolute 0.2 0.0 - 0.7 K/uL   Basophils Relative 1 0 - 1 %   Basophils Absolute 0.1 0.0 - 0.1 K/uL   Smear Review Criteria for review not met   COMPLETE METABOLIC PANEL WITH GFR  Result Value Ref Range   Sodium 142 135 - 145 mEq/L   Potassium 4.2 3.5 - 5.3 mEq/L   Chloride 103 96 - 112 mEq/L   CO2 30 19 - 32 mEq/L   Glucose, Bld 113 (H) 70 - 99 mg/dL   BUN 9 6 - 23 mg/dL   Creat 1.02 0.50 - 1.10 mg/dL   Total Bilirubin 0.7 0.2 - 1.2 mg/dL   Alkaline Phosphatase 77 39 - 117 U/L   AST 20 0 - 37 U/L   ALT 24 0 - 35 U/L   Total Protein 7.0 6.0 - 8.3 g/dL   Albumin 4.4 3.5 - 5.2 g/dL   Calcium 9.5 8.4 - 10.5 mg/dL   GFR, Est African American 66 mL/min   GFR, Est Non African American 57 (L) mL/min  Hemoglobin A1c  Result Value Ref Range   Hgb A1c MFr Bld 6.0 (H) <5.7 %   Mean Plasma Glucose 126 (H) <117 mg/dL  Lipid panel  Result Value Ref Range   Cholesterol 150  0 - 200 mg/dL   Triglycerides 165 (H) <150 mg/dL   HDL 49 >39 mg/dL   Total CHOL/HDL Ratio 3.1 Ratio   VLDL 33 0 - 40 mg/dL   LDL Cholesterol 68 0 - 99 mg/dL  Iron  Result Value Ref Range   Iron 91 42 - 145 ug/dL  Ferritin  Result Value Ref Range   Ferritin 51 10 - 291 ng/mL        Assessment & Plan:  No diagnosis found.  No orders of the defined types were placed in this encounter.    No Follow-up on file.    Brycelynn Stampley Elayne Guerin, M.D. [facility] [address] [place], [place]  [address] [phone] phone [phone] fax

## 2015-05-15 NOTE — Progress Notes (Signed)
This encounter was created in error - please disregard.

## 2015-05-23 ENCOUNTER — Other Ambulatory Visit: Payer: Self-pay | Admitting: Family Medicine

## 2015-06-15 ENCOUNTER — Ambulatory Visit (INDEPENDENT_AMBULATORY_CARE_PROVIDER_SITE_OTHER): Payer: Medicare Other | Admitting: Family Medicine

## 2015-06-15 ENCOUNTER — Encounter: Payer: Self-pay | Admitting: Family Medicine

## 2015-06-15 VITALS — BP 122/84 | HR 71 | Temp 98.5°F | Resp 16 | Ht 63.0 in | Wt 217.2 lb

## 2015-06-15 DIAGNOSIS — J301 Allergic rhinitis due to pollen: Secondary | ICD-10-CM | POA: Diagnosis not present

## 2015-06-15 DIAGNOSIS — E7800 Pure hypercholesterolemia, unspecified: Secondary | ICD-10-CM

## 2015-06-15 DIAGNOSIS — I878 Other specified disorders of veins: Secondary | ICD-10-CM | POA: Diagnosis not present

## 2015-06-15 DIAGNOSIS — M797 Fibromyalgia: Secondary | ICD-10-CM

## 2015-06-15 DIAGNOSIS — G2581 Restless legs syndrome: Secondary | ICD-10-CM | POA: Diagnosis not present

## 2015-06-15 DIAGNOSIS — F419 Anxiety disorder, unspecified: Secondary | ICD-10-CM

## 2015-06-15 DIAGNOSIS — F329 Major depressive disorder, single episode, unspecified: Secondary | ICD-10-CM

## 2015-06-15 DIAGNOSIS — F418 Other specified anxiety disorders: Secondary | ICD-10-CM

## 2015-06-15 DIAGNOSIS — R7302 Impaired glucose tolerance (oral): Secondary | ICD-10-CM | POA: Diagnosis not present

## 2015-06-15 LAB — CBC WITH DIFFERENTIAL/PLATELET
Basophils Absolute: 0 10*3/uL (ref 0.0–0.1)
Basophils Relative: 0 % (ref 0–1)
Eosinophils Absolute: 0.1 10*3/uL (ref 0.0–0.7)
Eosinophils Relative: 2 % (ref 0–5)
HCT: 38.8 % (ref 36.0–46.0)
Hemoglobin: 13.5 g/dL (ref 12.0–15.0)
Lymphocytes Relative: 35 % (ref 12–46)
Lymphs Abs: 1.8 10*3/uL (ref 0.7–4.0)
MCH: 30.8 pg (ref 26.0–34.0)
MCHC: 34.8 g/dL (ref 30.0–36.0)
MCV: 88.6 fL (ref 78.0–100.0)
MPV: 10.7 fL (ref 8.6–12.4)
Monocytes Absolute: 0.4 10*3/uL (ref 0.1–1.0)
Monocytes Relative: 7 % (ref 3–12)
Neutro Abs: 2.9 10*3/uL (ref 1.7–7.7)
Neutrophils Relative %: 56 % (ref 43–77)
Platelets: 195 10*3/uL (ref 150–400)
RBC: 4.38 MIL/uL (ref 3.87–5.11)
RDW: 14.1 % (ref 11.5–15.5)
WBC: 5.2 10*3/uL (ref 4.0–10.5)

## 2015-06-15 LAB — POCT URINALYSIS DIPSTICK
Bilirubin, UA: NEGATIVE
Blood, UA: NEGATIVE
Glucose, UA: NEGATIVE
Ketones, UA: NEGATIVE
Leukocytes, UA: NEGATIVE
Nitrite, UA: NEGATIVE
Protein, UA: NEGATIVE
Spec Grav, UA: 1.015
Urobilinogen, UA: 0.2
pH, UA: 7

## 2015-06-15 LAB — COMPREHENSIVE METABOLIC PANEL
ALT: 27 U/L (ref 6–29)
AST: 22 U/L (ref 10–35)
Albumin: 4.3 g/dL (ref 3.6–5.1)
Alkaline Phosphatase: 77 U/L (ref 33–130)
BUN: 10 mg/dL (ref 7–25)
CO2: 31 mmol/L (ref 20–31)
Calcium: 9.5 mg/dL (ref 8.6–10.4)
Chloride: 103 mmol/L (ref 98–110)
Creat: 1.07 mg/dL — ABNORMAL HIGH (ref 0.50–0.99)
Glucose, Bld: 112 mg/dL — ABNORMAL HIGH (ref 65–99)
Potassium: 4.3 mmol/L (ref 3.5–5.3)
Sodium: 144 mmol/L (ref 135–146)
Total Bilirubin: 0.7 mg/dL (ref 0.2–1.2)
Total Protein: 7 g/dL (ref 6.1–8.1)

## 2015-06-15 LAB — HEMOGLOBIN A1C
Hgb A1c MFr Bld: 5.8 % — ABNORMAL HIGH (ref <5.7)
Mean Plasma Glucose: 120 mg/dL — ABNORMAL HIGH (ref <117)

## 2015-06-15 LAB — LIPID PANEL
Cholesterol: 158 mg/dL (ref 125–200)
HDL: 51 mg/dL (ref >=46)
LDL Cholesterol: 74 mg/dL (ref <130)
Total CHOL/HDL Ratio: 3.1 Ratio (ref <=5.0)
Triglycerides: 163 mg/dL — ABNORMAL HIGH (ref <150)
VLDL: 33 mg/dL — ABNORMAL HIGH (ref <30)

## 2015-06-15 MED ORDER — FLUTICASONE PROPIONATE 50 MCG/ACT NA SUSP: 2.0000 | Freq: Every day | NASAL | Status: DC

## 2015-06-15 MED ORDER — ROPINIROLE HCL 0.5 MG PO TABS: 0.5000 mg | ORAL_TABLET | Freq: Every day | ORAL | Status: DC

## 2015-06-15 MED ORDER — FUROSEMIDE 20 MG PO TABS: ORAL_TABLET | ORAL | Status: DC

## 2015-06-15 MED ORDER — ATORVASTATIN CALCIUM 10 MG PO TABS: ORAL_TABLET | ORAL | Status: DC

## 2015-06-15 NOTE — Progress Notes (Signed)
Subjective:    Patient ID: Carol [name], female    DOB: [birth year], 69 y.o.   MRN: [id number]  06/15/2015  Medication Refill; Hyperlipidemia; and Depression   HPI This 69 y.o. female presents for nine month follow-up:  1.  Hyperlipidemia: Patient reports good compliance with medication, good tolerance to medication, and good symptom control.    2.  Glucose intolerance:  Weight up nine pounds.  Not exercising; not watching sugar or carbohydrate intake.  3.  Venous stasis: Patient reports good compliance with medication, good tolerance to medication, and good symptom control.    4. Anxiety and depression: Not taking Sertraline or Fluoxetine; took Prozac for one week only; no side effects.  Rather stay at home; not staying in bed; getting dressed most days.  Not cleaning the house well.  Lack of motivation.   Dean died in 03-Apr-2015; starved to death; last week, unable to swallow at all.  Blessing for him to go.  Closed casket.  Has been really down and depressed for the past two years because twin sister has been dealing with chronic illness of husband. Now doing better emotionally.   5. Fibromyalgia: Patient reports good compliance with medication, good tolerance to medication, and good symptom control.  Goes for deep massage therapy every other week.  Suffers with ongoing joint and muscle pains. Not exercising and realizes that this would help.    6. RLS: started Requip at last visit.  It is a wonderful medication; is taking 2 at bedtime.  Sleeping really well.  Takes every night; takes at 8:00pm.  Symptoms start at 7:00pm.     Review of Systems  Constitutional: Negative for fever, chills, diaphoresis and fatigue.  HENT: Positive for congestion. Negative for postnasal drip, rhinorrhea, sinus pressure, sore throat and trouble swallowing.   Eyes: Negative for visual disturbance.  Respiratory: Negative for cough and shortness of breath.   Cardiovascular: Positive for leg swelling.  Negative for chest pain and palpitations.  Gastrointestinal: Negative for nausea, vomiting, abdominal pain, diarrhea and constipation.  Endocrine: Negative for cold intolerance, heat intolerance, polydipsia, polyphagia and polyuria.  Skin: Negative for rash.  Neurological: Negative for dizziness, tremors, seizures, syncope, facial asymmetry, speech difficulty, weakness, light-headedness, numbness and headaches.  Psychiatric/Behavioral: Positive for dysphoric mood. Negative for suicidal ideas, sleep disturbance, self-injury and decreased concentration. The patient is not nervous/anxious.     Past Medical History  Diagnosis Date  . Fibromyalgia   . HLD (hyperlipidemia)   . Personal history of colonic polyps   . 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 Unspecified sinusitis (chronic)   . Other abnormal glucose   . Swelling of limb   . Tobacco use disorder   . 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 Allergic rhinitis, cause unspecified   . Chicken pox   . Cataract    Past Surgical History  Procedure Laterality Date  . Appendectomy  06/1997  . External ear surgery  09/2008    external - not specified  (RIGHT)  . Breast biopsy  2002    Right  . Colposcopy  10/16/2011    HGSIL pap smear.  Negative.  . Colonoscopy  10/15/2012    mutliple polyps.  Outlaw in [place].  Repeat 5 years.   No Known Allergies  Social History   Social History  . Marital Status: Married    Spouse Name: N/A  . Number of Children: 2  . Years of Education: N/A   Occupational  History  . homemaker    Social History Main Topics  . Smoking status: Former Smoker -- 1.00 packs/day for 40 years    Types: Cigarettes    Quit date: 04/01/2012  . Smokeless tobacco: Not on file     Comment: 7-10 cigarettes for past 40 years   . Alcohol Use: Yes     Comment: occasional  5 drinks per year  . Drug Use: No  . Sexual Activity: Yes   Other Topics Concern  . Not on file   Social  History Narrative   Marital status: married x 76 years; happily married; no abuse.      Children: 2 sons; one granddaughter (30).      Lives: with husband.      Employment: unemployed.      Tobacco: quit smoking 03/2012.  Electronic cigarette.      Alcohol: socially; 2 glasses per year.      Exercise:  None/sporadic       Always uses seat belts.   Smoke alarm and carbon monoxide detector in the home.       No guns.      Caffeine HFS:FSELTR 3 servings per day.   Family History  Problem Relation Age of Onset  . Arthritis    . Coronary artery disease    . Heart failure Father   . Hypertension Father   . Stroke Father   . Hyperlipidemia Father   . Heart failure Brother   . Diabetes Brother   . Heart disease Brother   . Diabetes Brother   . Heart disease Brother   . Hyperlipidemia Brother   . Hypertension Brother   . Hypertension Sister   . Depression Sister   . Hypertension Sister   . Hyperlipidemia Sister        Objective:    BP 122/84 mmHg  Pulse 71  Temp(Src) 98.5 F (36.9 C) (Oral)  Resp 16  Ht _0  (1.6 m)  Wt 217 lb 3.2 oz (98.521 kg)  BMI 38.48 kg/m2  SpO2 96% Physical Exam  Constitutional: She is oriented to person, place, and time. She appears well-developed and well-nourished. No distress.  obese  HENT:  Head: Normocephalic and atraumatic.  Right Ear: External ear normal.  Left Ear: External ear normal.  Nose: Nose normal.  Mouth/Throat: Oropharynx is clear and moist.  Eyes: Conjunctivae and EOM are normal. Pupils are equal, round, and reactive to light.  Neck: Normal range of motion. Neck supple. Carotid bruit is not present. No thyromegaly present.  Cardiovascular: Normal rate, regular rhythm, normal heart sounds and intact distal pulses.  Exam reveals no gallop and no friction rub.   No murmur heard. 1+ pitting edema B feet and ankles.  Pulmonary/Chest: Effort normal and breath sounds normal. She has no wheezes. She has no rales.  Abdominal: Soft.  Bowel sounds are normal. She exhibits no distension and no mass. There is no tenderness. There is no rebound and no guarding.  Lymphadenopathy:    She has no cervical adenopathy.  Neurological: She is alert and oriented to person, place, and time. No cranial nerve deficit.  Skin: Skin is warm and dry. No rash noted. She is not diaphoretic. No erythema. No pallor.  Psychiatric: She has a normal mood and affect. Her behavior is normal. Judgment and thought content normal.   Results for orders placed or performed in visit on 09/20/14  CBC with Differential  Result Value Ref Range   WBC 5.6 4.0 - 10.5  K/uL   RBC 4.56 3.87 - 5.11 MIL/uL   Hemoglobin 13.8 12.0 - 15.0 g/dL   HCT 40.0 36.0 - 46.0 %   MCV 87.7 78.0 - 100.0 fL   MCH 30.3 26.0 - 34.0 pg   MCHC 34.5 30.0 - 36.0 g/dL   RDW 13.8 11.5 - 15.5 %   Platelets 213 150 - 400 K/uL   MPV 10.8 9.4 - 12.4 fL   Neutrophils Relative % 57 43 - 77 %   Neutro Abs 3.2 1.7 - 7.7 K/uL   Lymphocytes Relative 30 12 - 46 %   Lymphs Abs 1.7 0.7 - 4.0 K/uL   Monocytes Relative 9 3 - 12 %   Monocytes Absolute 0.5 0.1 - 1.0 K/uL   Eosinophils Relative 3 0 - 5 %   Eosinophils Absolute 0.2 0.0 - 0.7 K/uL   Basophils Relative 1 0 - 1 %   Basophils Absolute 0.1 0.0 - 0.1 K/uL   Smear Review Criteria for review not met   COMPLETE METABOLIC PANEL WITH GFR  Result Value Ref Range   Sodium 142 135 - 145 mEq/L   Potassium 4.2 3.5 - 5.3 mEq/L   Chloride 103 96 - 112 mEq/L   CO2 30 19 - 32 mEq/L   Glucose, Bld 113 (H) 70 - 99 mg/dL   BUN 9 6 - 23 mg/dL   Creat 1.02 0.50 - 1.10 mg/dL   Total Bilirubin 0.7 0.2 - 1.2 mg/dL   Alkaline Phosphatase 77 39 - 117 U/L   AST 20 0 - 37 U/L   ALT 24 0 - 35 U/L   Total Protein 7.0 6.0 - 8.3 g/dL   Albumin 4.4 3.5 - 5.2 g/dL   Calcium 9.5 8.4 - 10.5 mg/dL   GFR, Est African American 66 mL/min   GFR, Est Non African American 57 (L) mL/min  Hemoglobin A1c  Result Value Ref Range   Hgb A1c MFr Bld 6.0 (H) <5.7 %    Mean Plasma Glucose 126 (H) <117 mg/dL  Lipid panel  Result Value Ref Range   Cholesterol 150 0 - 200 mg/dL   Triglycerides 165 (H) <150 mg/dL   HDL 49 >39 mg/dL   Total CHOL/HDL Ratio 3.1 Ratio   VLDL 33 0 - 40 mg/dL   LDL Cholesterol 68 0 - 99 mg/dL  Iron  Result Value Ref Range   Iron 91 42 - 145 ug/dL  Ferritin  Result Value Ref Range   Ferritin 51 10 - 291 ng/mL       Assessment & Plan:   1. Pure hypercholesterolemia   2. Glucose intolerance (impaired glucose tolerance)   3. Fibromyalgia   4. Anxiety and depression   5. Venous stasis   6. Restless leg syndrome   7. Allergic rhinitis due to pollen     1. Hypercholesterolemia: stable; obtain labs; refills provided. 2. Glucose intolerance: stable; obtain labs; has gained weight since last visit; non-compliant with dietary modification. 3.  Fibromyalgia: persistent; encourage regular exercise. 4.  Anxiety and depression: improved since last visit; no medication at this time and doing well. 5.  Venous stasis B lower extremities: persistent; obtain labs; refill provided. 6.  RLS: improved; rx for Requip 0.34m qhs provided. Normal iron studies at last visit. 7. Allergic Rhinitis: controlled; continue Zyrtec; refill of Flonase provided.   Orders Placed This Encounter  Procedures  . CBC with Differential/Platelet  . Comprehensive metabolic panel    Order Specific Question:  Has the patient fasted?  Answer:  Yes  . Hemoglobin A1c  . Lipid panel    Order Specific Question:  Has the patient fasted?    Answer:  Yes  . POCT urinalysis dipstick   Meds ordered this encounter  Medications  . atorvastatin (LIPITOR) 10 MG tablet    Sig: TAKE ONE (1) TABLET BY MOUTH EVERY DAY    Dispense:  90 tablet    Refill:  3  . furosemide (LASIX) 20 MG tablet    Sig: TAKE ONE (1) TABLET BY MOUTH TWO (2) TIMES DAILY.    Dispense:  180 tablet    Refill:  3  . rOPINIRole (REQUIP) 0.5 MG tablet    Sig: Take 1 tablet (0.5 mg total) by  mouth at bedtime.    Dispense:  90 tablet    Refill:  3  . fluticasone (FLONASE) 50 MCG/ACT nasal spray    Sig: Place 2 sprays into both nostrils daily.    Dispense:  16 g    Refill:  6    Return in about 6 months (around 12/13/2015) for complete physical examiniation.   Atiya Yera Elayne Guerin, M.D. [facility] [address] [place], [place]  [address] [phone] phone [phone] fax

## 2015-07-26 ENCOUNTER — Other Ambulatory Visit: Payer: Self-pay | Admitting: Family Medicine

## 2015-08-05 ENCOUNTER — Other Ambulatory Visit: Payer: Self-pay | Admitting: Family Medicine

## 2015-12-16 ENCOUNTER — Encounter: Payer: Medicare Other | Admitting: Family Medicine

## 2015-12-27 ENCOUNTER — Ambulatory Visit (INDEPENDENT_AMBULATORY_CARE_PROVIDER_SITE_OTHER): Payer: Medicare Other | Admitting: Family Medicine

## 2015-12-27 ENCOUNTER — Encounter: Payer: Self-pay | Admitting: Family Medicine

## 2015-12-27 VITALS — BP 104/68 | HR 87 | Temp 99.4°F | Resp 16 | Ht 63.0 in | Wt 219.0 lb

## 2015-12-27 DIAGNOSIS — Z1231 Encounter for screening mammogram for malignant neoplasm of breast: Secondary | ICD-10-CM | POA: Diagnosis not present

## 2015-12-27 DIAGNOSIS — Z0000 Encounter for general adult medical examination without abnormal findings: Secondary | ICD-10-CM

## 2015-12-27 DIAGNOSIS — G2581 Restless legs syndrome: Secondary | ICD-10-CM | POA: Diagnosis not present

## 2015-12-27 DIAGNOSIS — R7302 Impaired glucose tolerance (oral): Secondary | ICD-10-CM

## 2015-12-27 DIAGNOSIS — M797 Fibromyalgia: Secondary | ICD-10-CM | POA: Diagnosis not present

## 2015-12-27 DIAGNOSIS — J301 Allergic rhinitis due to pollen: Secondary | ICD-10-CM | POA: Diagnosis not present

## 2015-12-27 DIAGNOSIS — I878 Other specified disorders of veins: Secondary | ICD-10-CM | POA: Diagnosis not present

## 2015-12-27 DIAGNOSIS — E7800 Pure hypercholesterolemia, unspecified: Secondary | ICD-10-CM | POA: Diagnosis not present

## 2015-12-27 DIAGNOSIS — E2839 Other primary ovarian failure: Secondary | ICD-10-CM | POA: Diagnosis not present

## 2015-12-27 DIAGNOSIS — R0600 Dyspnea, unspecified: Secondary | ICD-10-CM

## 2015-12-27 DIAGNOSIS — R0609 Other forms of dyspnea: Secondary | ICD-10-CM

## 2015-12-27 DIAGNOSIS — F418 Other specified anxiety disorders: Secondary | ICD-10-CM | POA: Diagnosis not present

## 2015-12-27 DIAGNOSIS — Z124 Encounter for screening for malignant neoplasm of cervix: Secondary | ICD-10-CM

## 2015-12-27 DIAGNOSIS — Z1159 Encounter for screening for other viral diseases: Secondary | ICD-10-CM | POA: Diagnosis not present

## 2015-12-27 DIAGNOSIS — F419 Anxiety disorder, unspecified: Secondary | ICD-10-CM

## 2015-12-27 DIAGNOSIS — F329 Major depressive disorder, single episode, unspecified: Secondary | ICD-10-CM

## 2015-12-27 LAB — COMPREHENSIVE METABOLIC PANEL
ALT: 27 U/L (ref 6–29)
AST: 20 U/L (ref 10–35)
Albumin: 4.4 g/dL (ref 3.6–5.1)
Alkaline Phosphatase: 90 U/L (ref 33–130)
BUN: 7 mg/dL (ref 7–25)
CO2: 31 mmol/L (ref 20–31)
Calcium: 9.7 mg/dL (ref 8.6–10.4)
Chloride: 100 mmol/L (ref 98–110)
Creat: 1.05 mg/dL — ABNORMAL HIGH (ref 0.50–0.99)
Glucose, Bld: 115 mg/dL — ABNORMAL HIGH (ref 65–99)
Potassium: 4.4 mmol/L (ref 3.5–5.3)
Sodium: 141 mmol/L (ref 135–146)
Total Bilirubin: 0.6 mg/dL (ref 0.2–1.2)
Total Protein: 7.2 g/dL (ref 6.1–8.1)

## 2015-12-27 LAB — POCT URINALYSIS DIP (MANUAL ENTRY)
Bilirubin, UA: NEGATIVE
Blood, UA: NEGATIVE
Glucose, UA: NEGATIVE
Ketones, POC UA: NEGATIVE
Leukocytes, UA: NEGATIVE
Nitrite, UA: NEGATIVE
Protein Ur, POC: NEGATIVE
Spec Grav, UA: 1.02
Urobilinogen, UA: 0.2
pH, UA: 6

## 2015-12-27 LAB — CBC WITH DIFFERENTIAL/PLATELET
Basophils Absolute: 0.1 10*3/uL (ref 0.0–0.1)
Basophils Relative: 1 % (ref 0–1)
Eosinophils Absolute: 0.1 10*3/uL (ref 0.0–0.7)
Eosinophils Relative: 2 % (ref 0–5)
HCT: 41.3 % (ref 36.0–46.0)
Hemoglobin: 14.2 g/dL (ref 12.0–15.0)
Lymphocytes Relative: 30 % (ref 12–46)
Lymphs Abs: 1.9 10*3/uL (ref 0.7–4.0)
MCH: 30.9 pg (ref 26.0–34.0)
MCHC: 34.4 g/dL (ref 30.0–36.0)
MCV: 89.8 fL (ref 78.0–100.0)
MPV: 10.4 fL (ref 8.6–12.4)
Monocytes Absolute: 0.4 10*3/uL (ref 0.1–1.0)
Monocytes Relative: 7 % (ref 3–12)
Neutro Abs: 3.7 10*3/uL (ref 1.7–7.7)
Neutrophils Relative %: 60 % (ref 43–77)
Platelets: 225 10*3/uL (ref 150–400)
RBC: 4.6 MIL/uL (ref 3.87–5.11)
RDW: 13.8 % (ref 11.5–15.5)
WBC: 6.2 10*3/uL (ref 4.0–10.5)

## 2015-12-27 LAB — LIPID PANEL
Cholesterol: 182 mg/dL (ref 125–200)
HDL: 49 mg/dL (ref >=46)
LDL Cholesterol: 88 mg/dL (ref <130)
Total CHOL/HDL Ratio: 3.7 Ratio (ref <=5.0)
Triglycerides: 225 mg/dL — ABNORMAL HIGH (ref <150)
VLDL: 45 mg/dL — ABNORMAL HIGH (ref <30)

## 2015-12-27 MED ORDER — ROPINIROLE HCL 0.5 MG PO TABS: 0.5000 mg | ORAL_TABLET | Freq: Every day | ORAL | Status: DC

## 2015-12-27 MED ORDER — FUROSEMIDE 20 MG PO TABS: ORAL_TABLET | ORAL | Status: DC

## 2015-12-27 MED ORDER — ATORVASTATIN CALCIUM 10 MG PO TABS: ORAL_TABLET | ORAL | Status: DC

## 2015-12-27 NOTE — Progress Notes (Signed)
Subjective:    Patient ID: Carol [name], female    DOB: 24-Nov-1945, 70 y.o.   MRN: [id number]  12/27/2015  Annual Exam   HPI This 70 y.o. female presents for Annual Wellness Examination.  Last physical:  02-15-2014 Pap smear:  02-15-2014 Mammogram:  01-22-2014 Colonoscopy: 2014  Bone density:  2010 TDAP:  2012 Pneumovax:  2013; 2015 Zostavax: Influenza: today Eye exam:  Due; +glasses Dental exam:  Every six months.  Depression/lack of motivation: has been laying in the sun room for two years; onset was plantar fasciitis when unable to walk; now no energy to walk to walk to parking lot.  Walking for exercise is a challenge; afraid to walk; afraid something is wrong with heart due to weight gain and sedentary lifestyle.  If takes bath and gets ready, totally out of breath.    Review of Systems  Constitutional: Negative for fever, chills, diaphoresis, activity change, appetite change, fatigue and unexpected weight change.  HENT: Negative for congestion, dental problem, drooling, ear discharge, ear pain, facial swelling, hearing loss, mouth sores, nosebleeds, postnasal drip, rhinorrhea, sinus pressure, sneezing, sore throat, tinnitus, trouble swallowing and voice change.   Eyes: Negative for photophobia, pain, discharge, redness, itching and visual disturbance.  Respiratory: Negative for apnea, cough, choking, chest tightness, shortness of breath, wheezing and stridor.   Cardiovascular: Negative for chest pain, palpitations and leg swelling.  Gastrointestinal: Negative for nausea, vomiting, abdominal pain, diarrhea, constipation, blood in stool, abdominal distention, anal bleeding and rectal pain.  Endocrine: Negative for cold intolerance, heat intolerance, polydipsia, polyphagia and polyuria.  Genitourinary: Negative for dysuria, urgency, frequency, hematuria, flank pain, decreased urine volume, vaginal bleeding, vaginal discharge, enuresis, difficulty urinating, genital sores, vaginal  pain, menstrual problem, pelvic pain and dyspareunia.  Musculoskeletal: Negative for myalgias, back pain, joint swelling, arthralgias, gait problem, neck pain and neck stiffness.  Skin: Negative for color change, pallor, rash and wound.  Allergic/Immunologic: Negative for environmental allergies, food allergies and immunocompromised state.  Neurological: Negative for dizziness, tremors, seizures, syncope, facial asymmetry, speech difficulty, weakness, light-headedness, numbness and headaches.  Hematological: Negative for adenopathy. Does not bruise/bleed easily.  Psychiatric/Behavioral: Negative for suicidal ideas, hallucinations, behavioral problems, confusion, sleep disturbance, self-injury, dysphoric mood, decreased concentration and agitation. The patient is not nervous/anxious and is not hyperactive.     Past Medical History  Diagnosis Date  . Fibromyalgia   . HLD (hyperlipidemia)   . Personal history of colonic polyps   . 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 Unspecified sinusitis (chronic)   . Other abnormal glucose   . Swelling of limb   . Tobacco use disorder   . 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 Allergic rhinitis, cause unspecified   . Chicken pox   . Cataract    Past Surgical History  Procedure Laterality Date  . Appendectomy  06/1997  . External ear surgery  09/2008    external - not specified  (RIGHT)  . Breast biopsy  2002    Right  . Colposcopy  10/16/2011    HGSIL pap smear.  Negative.  . Colonoscopy  10/15/2012    mutliple polyps.  Outlaw in [place].  Repeat 5 years.   No Known Allergies Current Outpatient Prescriptions  Medication Sig Dispense Refill  . ascorbic acid (VITAMIN C) 500 MG tablet Take 500 mg by mouth daily.    Marland Kitchen aspirin 325 MG tablet Take 325 mg by mouth daily.      Marland Kitchen atorvastatin (LIPITOR)  10 MG tablet TAKE ONE (1) TABLET BY MOUTH EVERY DAY 90 tablet 3  . Cetirizine HCl (ZYRTEC PO) Take 10 mg by mouth.     . Cholecalciferol (VITAMIN D3) 2000 UNITS capsule Take 2,000 Units by mouth daily.      . diazepam (VALIUM) 2 MG tablet Take 1 tablet (2 mg total) by mouth every 8 (eight) hours as needed (dizziness/vertigo). 30 tablet 0  . FLUoxetine (PROZAC) 20 MG tablet Take 1 tablet (20 mg total) by mouth daily. 30 tablet 5  . fluticasone (FLONASE) 50 MCG/ACT nasal spray Place 2 sprays into both nostrils daily. 16 g 6  . furosemide (LASIX) 20 MG tablet TAKE ONE (1) TABLET BY MOUTH TWO (2) TIMES DAILY. 180 tablet 3  . Glucosamine Sulfate 1000 MG CAPS Take 1 capsule by mouth daily.      . Misc Natural Products (OSTEO BI-FLEX JOINT SHIELD) TABS Take 2 tablets by mouth daily. With magnesium     . Multiple Vitamin (MULTIVITAMIN) tablet Take 1 tablet by mouth daily.    . NON FORMULARY Swiss kirss herbalaxative     . rOPINIRole (REQUIP) 0.5 MG tablet Take 1 tablet (0.5 mg total) by mouth at bedtime. 90 tablet 3  . meloxicam (MOBIC) 15 MG tablet Take 1 tablet (15 mg total) by mouth daily. (Patient not taking: Reported on 09/20/2014) 30 tablet 0  . sertraline (ZOLOFT) 50 MG tablet Take 50 mg by mouth daily. Reported on 12/27/2015     No current facility-administered medications for this visit.   Social History   Social History  . Marital Status: Married    Spouse Name: N/A  . Number of Children: 2  . Years of Education: N/A   Occupational History  . homemaker    Social History Main Topics  . Smoking status: Former Smoker -- 1.00 packs/day for 40 years    Types: Cigarettes    Quit date: 04/01/2012  . Smokeless tobacco: Not on file     Comment: 7-10 cigarettes for past 40 years   . Alcohol Use: Yes     Comment: occasional  5 drinks per year  . Drug Use: No  . Sexual Activity: Yes   Other Topics Concern  . Not on file   Social History Narrative   Marital status: married x 45 years; happily married; no abuse.      Children: 2 sons; one granddaughter (46).      Lives: with husband.      Employment:  unemployed/homemaker.      Tobacco: quit smoking 03/2012.  Electronic cigarette.      Alcohol: socially; 2 glasses per year.      Exercise:  None/sporadic      Seatbelt:  Always uses seat belts.   Smoke alarm and carbon monoxide detector in the home.       Guns:  No guns.      Caffeine [id number] 3 servings per day.      ADLs: independent with ADLs in 2017      Advanced Directives: yes; DNR/DNI   Family History  Problem Relation Age of Onset  . Arthritis    . Coronary artery disease    . Heart failure Father   . Hypertension Father   . Stroke Father   . Hyperlipidemia Father   . Heart failure Brother   . Diabetes Brother   . Heart disease Brother   . Diabetes Brother   . Heart disease Brother   . Hyperlipidemia Brother   .  Hypertension Brother   . Hypertension Sister   . Depression Sister   . Hypertension Sister   . Hyperlipidemia Sister        Objective:    BP 104/68 mmHg  Pulse 87  Temp(Src) 99.4 F (37.4 C) (Oral)  Resp 16  Ht 5\' 3"  (1.6 m)  Wt 219 lb (99.338 kg)  BMI 38.80 kg/m2  SpO2 95% Physical Exam  Constitutional: She is oriented to person, place, and time. She appears well-developed and well-nourished. No distress.  HENT:  Head: Normocephalic and atraumatic.  Right Ear: External ear normal.  Left Ear: External ear normal.  Nose: Nose normal.  Mouth/Throat: Oropharynx is clear and moist.  Eyes: Conjunctivae and EOM are normal. Pupils are equal, round, and reactive to light.  Neck: Normal range of motion and full passive range of motion without pain. Neck supple. No JVD present. Carotid bruit is not present. No thyromegaly present.  Cardiovascular: Normal rate, regular rhythm and normal heart sounds.  Exam reveals no gallop and no friction rub.   No murmur heard. Pulmonary/Chest: Effort normal and breath sounds normal. She has no wheezes. She has no rales.  Abdominal: Soft. Bowel sounds are normal. She exhibits no distension and no mass. There is no  tenderness. There is no rebound and no guarding.  Musculoskeletal:       Right shoulder: Normal.       Left shoulder: Normal.       Cervical back: Normal.  Lymphadenopathy:    She has no cervical adenopathy.  Neurological: She is alert and oriented to person, place, and time. She has normal reflexes. No cranial nerve deficit. She exhibits normal muscle tone. Coordination normal.  Skin: Skin is warm and dry. No rash noted. She is not diaphoretic. No erythema. No pallor.  Psychiatric: She has a normal mood and affect. Her behavior is normal. Judgment and thought content normal.  Nursing note and vitals reviewed.  Depression screen [facility] 2/9 12/27/2015 06/15/2015 02/15/2014  Decreased Interest 3 0 1  Down, Depressed, Hopeless 0 0 0  PHQ - 2 Score 3 0 1  Altered sleeping 1 - -  Tired, decreased energy 3 - -  Change in appetite 3 - -  Feeling bad or failure about yourself  3 - -  Trouble concentrating 0 - -  Moving slowly or fidgety/restless 0 - -  Suicidal thoughts 0 - -  PHQ-9 Score 13 - -   Fall Risk  12/27/2015 06/15/2015 02/15/2014  Falls in the past year? No No No       Assessment & Plan:   1. Medicare annual wellness visit, subsequent   2. Routine physical examination   3. Seasonal allergic rhinitis due to pollen   4. Restless leg syndrome   5. Fibromyalgia   6. Glucose intolerance (impaired glucose tolerance)   7. Anxiety and depression   8. Pure hypercholesterolemia   9. Need for hepatitis C screening test   10. Dyspnea on exertion   11. Estrogen deficiency   12. Encounter for screening mammogram for breast cancer   13. Encounter for screening for cervical cancer    14. Venous stasis     Orders Placed This Encounter  Procedures  . DG Bone Density    Standing Status: Future     Number of Occurrences:      Standing Expiration Date: 02/25/2017    Order Specific Question:  Reason for Exam (SYMPTOM  OR DIAGNOSIS REQUIRED)    Answer:  estrogen deficiency  Order Specific  Question:  Preferred imaging location?    Answer:  [facility]  . MM Digital Screening    Standing Status: Future     Number of Occurrences:      Standing Expiration Date: 02/25/2017    Order Specific Question:  Reason for Exam (SYMPTOM  OR DIAGNOSIS REQUIRED)    Answer:  annual screening    Order Specific Question:  Preferred imaging location?    Answer:  [facility]  . CBC with Differential/Platelet  . Comprehensive metabolic panel    Order Specific Question:  Has the patient fasted?    Answer:  Yes  . Hemoglobin A1c  . Lipid panel    Order Specific Question:  Has the patient fasted?    Answer:  Yes  . Hepatitis C antibody  . Ambulatory referral to Cardiology    Referral Priority:  Routine    Referral Type:  Consultation    Referral Reason:  Specialty Services Required    Referred to Provider:  Minna Merritts, MD    Requested Specialty:  Cardiology    Number of Visits Requested:  1  . POCT urinalysis dipstick  . EKG 12-Lead   Meds ordered this encounter  Medications  . rOPINIRole (REQUIP) 0.5 MG tablet    Sig: Take 1 tablet (0.5 mg total) by mouth at bedtime.    Dispense:  90 tablet    Refill:  3  . furosemide (LASIX) 20 MG tablet    Sig: TAKE ONE (1) TABLET BY MOUTH TWO (2) TIMES DAILY.    Dispense:  180 tablet    Refill:  3  . atorvastatin (LIPITOR) 10 MG tablet    Sig: TAKE ONE (1) TABLET BY MOUTH EVERY DAY    Dispense:  90 tablet    Refill:  3    Return in about 6 months (around 06/28/2016) for recheck high cholesterol, high sugar.    Lisbeth Puller Elayne Guerin, M.D. [facility] [address] [place], [place]  [id number] [phone] phone [phone] fax

## 2015-12-27 NOTE — Patient Instructions (Addendum)
IF you received an x-ray today, you will receive an invoice from [facility]. Please contact [facility] at [phone] with questions or concerns regarding your invoice.   IF you received labwork today, you will receive an invoice from [employer]. Please contact [facility] at [phone] with questions or concerns regarding your invoice.   Our billing staff will not be able to assist you with questions regarding bills from these companies.  You will be contacted with the lab results as soon as they are available. The fastest way to get your results is to activate your My Chart account. Instructions are located on the last page of this paperwork. If you have not heard from [place] regarding the results in 2 weeks, please contact this office.  [email or web address]   Keeping You Healthy  Get These Tests  Blood Pressure- Have your blood pressure checked by your healthcare provider at least once a year.  Normal blood pressure is 120/80.  Weight- Have your body mass index (BMI) calculated to screen for obesity.  BMI is a measure of body fat based on height and weight.  You can calculate your own BMI at [employer]velB[employer].it  Cholesterol- Have your cholesterol checked every year.  Diabetes- Have your blood sugar checked every year if you have high blood pressure, high cholesterol, a family history of diabetes or if you are overweight.  Pap Test - Have a pap test every 1 to 5 years if you have been sexually active.  If you are older than 65 and recent pap tests have been normal you may not need additional pap tests.  In addition, if you have had a hysterectomy  for benign disease additional pap tests are not necessary.  Mammogram-Yearly mammograms are essential for early detection of breast cancer  Screening for Colon Cancer- Colonoscopy starting at age 22. Screening may begin sooner depending on your family history and other health conditions.  Follow up  colonoscopy as directed by your Gastroenterologist.  Screening for Osteoporosis- Screening begins at age 45 with bone density scanning, sooner if you are at higher risk for developing Osteoporosis.  Get these medicines  Calcium with Vitamin D- Your body requires 1200-1500 mg of Calcium a day and [phone] IU of Vitamin D a day.  You can only absorb 500 mg of Calcium at a time therefore Calcium must be taken in 2 or 3 separate doses throughout the day.  Hormones- Hormone therapy has been associated with increased risk for certain cancers and heart disease.  Talk to your healthcare provider about if you need relief from menopausal symptoms.  Aspirin- Ask your healthcare provider about taking Aspirin to prevent Heart Disease and Stroke.  Get these Immuniztions  Flu shot- Every fall  Pneumonia shot- Once after the age of 43; if you are younger ask your healthcare provider if you need a pneumonia shot.  Tetanus- Every ten years.  Zostavax- Once after the age of 43 to prevent shingles.  Take these steps  Don't smoke- Your healthcare provider can help you quit. For tips on how to quit, ask your healthcare provider or go to www.smokefree.gov or call 1-800 QUIT-NOW.  Be physically active- Exercise 5 days a week for a minimum of 30 minutes.  If you are not already physically active, start slow and gradually work up to 30 minutes of moderate physical activity.  Try walking, dancing, bike riding, swimming, etc.  Eat a healthy diet- Eat a variety of healthy foods such as fruits, vegetables, whole grains, low  fat milk, low fat cheeses, yogurt, lean meats, chicken, fish, eggs, dried beans, tofu, etc.  For more information go to www.thenutritionsource.org  Dental visit- Brush and floss teeth twice daily; visit your dentist twice a year.  Eye exam- Visit your Optometrist or Ophthalmologist yearly.  Drink alcohol in moderation- Limit alcohol intake to one drink or less a day.  Never drink and  drive.  Depression- Your emotional health is as important as your physical health.  If you're feeling down or losing interest in things you normally enjoy, please talk to your healthcare provider.  Seat Belts- can save your life; always wear one  Smoke/Carbon Monoxide detectors- These detectors need to be installed on the appropriate level of your home.  Replace batteries at least once a year.  Violence- If anyone is threatening or hurting you, please tell your healthcare provider.  Living Will/ Health care power of attorney- Discuss with your healthcare provider and family.

## 2015-12-28 LAB — PAP IG W/ RFLX HPV ASCU

## 2015-12-28 LAB — HEMOGLOBIN A1C
Hgb A1c MFr Bld: 6.1 % — ABNORMAL HIGH (ref <5.7)
Mean Plasma Glucose: 128 mg/dL — ABNORMAL HIGH (ref <117)

## 2015-12-28 LAB — HEPATITIS C ANTIBODY: HCV Ab: NEGATIVE

## 2016-01-17 ENCOUNTER — Ambulatory Visit (INDEPENDENT_AMBULATORY_CARE_PROVIDER_SITE_OTHER): Payer: Medicare Other | Admitting: Cardiovascular Disease

## 2016-01-17 ENCOUNTER — Ambulatory Visit (INDEPENDENT_AMBULATORY_CARE_PROVIDER_SITE_OTHER)
Admission: RE | Admit: 2016-01-17 | Discharge: 2016-01-17 | Disposition: A | Discharge status: Home / Self Care | Payer: Medicare Other | Source: Ambulatory Visit | Attending: Cardiovascular Disease | Admitting: Cardiovascular Disease

## 2016-01-17 ENCOUNTER — Encounter: Payer: Self-pay | Admitting: Cardiovascular Disease

## 2016-01-17 VITALS — BP 118/72 | HR 86 | Ht 63.0 in | Wt 217.8 lb

## 2016-01-17 DIAGNOSIS — R0602 Shortness of breath: Secondary | ICD-10-CM | POA: Diagnosis not present

## 2016-01-17 DIAGNOSIS — M797 Fibromyalgia: Secondary | ICD-10-CM | POA: Diagnosis not present

## 2016-01-17 DIAGNOSIS — R7302 Impaired glucose tolerance (oral): Secondary | ICD-10-CM

## 2016-01-17 DIAGNOSIS — I5043 Acute on chronic combined systolic (congestive) and diastolic (congestive) heart failure: Secondary | ICD-10-CM | POA: Insufficient documentation

## 2016-01-17 DIAGNOSIS — E785 Hyperlipidemia, unspecified: Secondary | ICD-10-CM | POA: Diagnosis not present

## 2016-01-17 DIAGNOSIS — F17200 Nicotine dependence, unspecified, uncomplicated: Secondary | ICD-10-CM

## 2016-01-17 DIAGNOSIS — E669 Obesity, unspecified: Secondary | ICD-10-CM

## 2016-01-17 NOTE — Assessment & Plan Note (Signed)
Borderline diabetic, recommended weight loss,strict low carbohydrate diet Various diets discussed with her, she will try Nutrisystem

## 2016-01-17 NOTE — Patient Instructions (Addendum)
You are doing well.  Ok to take extra lasix sparingly  We will schedule you for a CT coronary calcium score for shortness of breath  You are scheduled today, Tues, April 4 @ 2:30, please arrive @ 2:15 There is a one-time fee of $150 due at the time of your procedure [facility]  [address] [place] [phone]  Please call us if you have new issues that need to be addressed before your next appt.   Coronary Calcium Scan A coronary calcium scan is an imaging test used to look for deposits of calcium and other fatty materials (plaques) in the inner lining of the blood vessels of your heart (coronary arteries). These deposits of calcium and plaques can partly clog and narrow the coronary arteries without producing any symptoms or warning signs. This puts you at risk for a heart attack. This test can detect these deposits before symptoms develop.  LET [facility] CARE PROVIDER KNOW ABOUT:  Any allergies you have.  All medicines you are taking, including vitamins, herbs, eye drops, creams, and over-the-counter medicines.  Previous problems you or members of your family have had with the use of anesthetics.  Any blood disorders you have.  Previous surgeries you have had.  Medical conditions you have.  Possibility of pregnancy, if this applies. RISKS AND COMPLICATIONS Generally, this is a safe procedure. However, as with any procedure, complications can occur. This test involves the use of radiation. Radiation exposure can be dangerous to a pregnant woman and her unborn baby. If you are pregnant, you should not have this procedure done.  BEFORE THE PROCEDURE There is no special preparation for the procedure. PROCEDURE  You will need to undress and put on a hospital gown. You will need to remove any jewelry around your neck or chest.  Sticky electrodes are placed on your chest and are connected to an electrocardiogram (EKG or electrocardiography) machine to  recorda tracing of the electrical activity of your heart.  A CT scanner will take pictures of your heart. During this time, you will be asked to lie still and hold your breath for 2-3 seconds while a picture is being taken of your heart. AFTER THE PROCEDURE   You will be allowed to get dressed.  You can return to your normal activities after the scan is done.   This information is not intended to replace advice given to you by your health care provider. Make sure you discuss any questions you have with your health care provider.   Document Released: 03/29/2008 Document Revised: 10/06/2013 Document Reviewed: 06/08/2013 Elsevier Interactive Patient Education [employer].

## 2016-01-17 NOTE — Assessment & Plan Note (Signed)
Cholesterol is at goal on the current lipid regimen. No changes to the medications were made.  

## 2016-01-17 NOTE — Assessment & Plan Note (Signed)
We have encouraged her to continue her smoking cessation. She will continue to work on this and does not want any assistance with chantix.

## 2016-01-17 NOTE — Assessment & Plan Note (Signed)
Etiology of her shortness of breath is likely multifactorial including obesity, deconditioning  unable to exclude chronic diastolic CHF.  Recommended she take extra Lasix as needed for worsening leg swelling or weight gain  unable to exclude ischemia. She does have diabetes, hyperlipidemia, family history.  Symptoms are relatively atypical.  Unable to treadmill well  Given deconditioning,  recommended CT coronary calcium scoring to start.  If score is very low, would likely be very low risk of underlying ischemia  At that point may not need additional testing.  if score is very high, may need   stress Myoview (lexiscan)

## 2016-01-17 NOTE — Progress Notes (Signed)
Patient ID: Carol [name], female    DOB: 1946/04/02, 69 y.o.   MRN: [id number]  HPI Comments: Carol [name] is a pleasant 70 year old woman with past medical history of borderline diabetes, obesity, hyperlipidemia, fibromyalgia who presents with shortness of breath, fatigue. Referred by Dr. [name]  She reports having worsening symptoms of malaise, fatigue, feels that she is giving out. She has shortness of breath on exertion, unable to walk very far. Reports having muscle loss. She has been absolutely sedentary for the past several years. Reports they have been "bad" years. Had problems with plantar fasciitis limiting her ability to exert herself.  She has significant leg edema which has been a chronic issue She does not wear compression hose. Drink significant fluids in the daytime, high salt intake She wants to start [employer] when she gets back from her beach vacation next month  Previous history includes stress test approximately 10 years ago, Recent lab work showing total cholesterol 182, LDL 88. Hemoglobin A1c between 5.8 and 6.1, borderline glucose 115.  EKG performed with Dr. Tamala Julian showing normal sinus rhythm, no significant ST or T-wave changes   No Known Allergies  Current Outpatient Prescriptions on File Prior to Visit  Medication Sig Dispense Refill  . ascorbic acid (VITAMIN C) 500 MG tablet Take 500 mg by mouth daily.    Marland Kitchen atorvastatin (LIPITOR) 10 MG tablet TAKE ONE (1) TABLET BY MOUTH EVERY DAY 90 tablet 3  . Cetirizine HCl (ZYRTEC PO) Take 10 mg by mouth.    . Cholecalciferol (VITAMIN D3) 2000 UNITS capsule Take 2,000 Units by mouth daily.      . diazepam (VALIUM) 2 MG tablet Take 1 tablet (2 mg total) by mouth every 8 (eight) hours as needed (dizziness/vertigo). 30 tablet 0  . fluticasone (FLONASE) 50 MCG/ACT nasal spray Place 2 sprays into both nostrils daily. 16 g 6  . furosemide (LASIX) 20 MG tablet TAKE ONE (1) TABLET BY MOUTH TWO (2) TIMES DAILY. 180  tablet 3  . Glucosamine Sulfate 1000 MG CAPS Take 1 capsule by mouth daily.      . Misc Natural Products (OSTEO BI-FLEX JOINT SHIELD) TABS Take 2 tablets by mouth daily. With magnesium     . Multiple Vitamin (MULTIVITAMIN) tablet Take 1 tablet by mouth daily.    . NON FORMULARY Swiss kirss herbalaxative     . rOPINIRole (REQUIP) 0.5 MG tablet Take 1 tablet (0.5 mg total) by mouth at bedtime. 90 tablet 3  . sertraline (ZOLOFT) 50 MG tablet Take 50 mg by mouth daily. Reported on 12/27/2015     No current facility-administered medications on file prior to visit.    Past Medical History  Diagnosis Date  . Fibromyalgia   . HLD (hyperlipidemia)   . Personal history of colonic polyps   . 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 Unspecified sinusitis (chronic)   . Other abnormal glucose   . Swelling of limb   . Tobacco use disorder   . 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 Allergic rhinitis, cause unspecified   . Chicken pox   . Cataract     Past Surgical History  Procedure Laterality Date  . Appendectomy  06/1997  . External ear surgery  09/2008    external - not specified  (RIGHT)  . Breast biopsy  2002    Right  . Colposcopy  10/16/2011    HGSIL pap smear.  Negative.  . Colonoscopy  10/15/2012  mutliple polyps.  Outlaw in [place].  Repeat 5 years.    Social History  reports that she quit smoking about 3 years ago. Her smoking use included Cigarettes. She has a 40 pack-year smoking history. She does not have any smokeless tobacco history on file. She reports that she drinks alcohol. She reports that she does not use illicit drugs.  Family History family history includes Depression in her sister; Diabetes in her brother and brother; Heart disease in her brother and brother; Heart failure in her brother and father; Hyperlipidemia in her brother, father, and sister; Hypertension in her brother, father, sister, and sister; Stroke in her  father.    Review of Systems  Constitutional: Positive for fatigue and unexpected weight change.  HENT: Negative.   Respiratory: Positive for shortness of breath.   Cardiovascular: Positive for leg swelling.  Gastrointestinal: Negative.   Musculoskeletal: Negative.   Allergic/Immunologic: Negative.   Neurological: Positive for weakness.  Hematological: Negative.   Psychiatric/Behavioral: Negative.   All other systems reviewed and are negative.   BP 118/72 mmHg  Pulse 86  Ht 5\' 3"  (1.6 m)  Wt 217 lb 12 oz (98.771 kg)  BMI 38.58 kg/m2  SpO2 98%   Physical Exam  Constitutional: She is oriented to person, place, and time. She appears well-developed and well-nourished.  Obese  HENT:  Head: Normocephalic.  Nose: Nose normal.  Mouth/Throat: Oropharynx is clear and moist.  Eyes: Conjunctivae are normal. Pupils are equal, round, and reactive to light.  Neck: Normal range of motion. Neck supple. No JVD present.  Cardiovascular: Normal rate, regular rhythm, normal heart sounds and intact distal pulses.  Exam reveals no gallop and no friction rub.   No murmur heard. Pulmonary/Chest: Effort normal and breath sounds normal. No respiratory distress. She has no wheezes. She has no rales. She exhibits no tenderness.  Abdominal: Soft. Bowel sounds are normal. She exhibits no distension. There is no tenderness.  Musculoskeletal: Normal range of motion. She exhibits no edema or tenderness.  Lymphadenopathy:    She has no cervical adenopathy.  Neurological: She is alert and oriented to person, place, and time. Coordination normal.  Skin: Skin is warm and dry. No rash noted. No erythema.  Psychiatric: She has a normal mood and affect. Her behavior is normal. Judgment and thought content normal.

## 2016-01-17 NOTE — Assessment & Plan Note (Signed)
Treated with massage, chiropractic

## 2016-01-17 NOTE — Assessment & Plan Note (Addendum)
We have encouraged continued exercise, careful diet management in an effort to lose weight. She would benefit from low impact exercise such as water aerobics, recumbent bike   Total encounter time more than 45 minutes  Greater than 50% was spent in counseling and coordination of care with the patient

## 2016-01-20 ENCOUNTER — Other Ambulatory Visit: Payer: Self-pay

## 2016-01-20 DIAGNOSIS — E7800 Pure hypercholesterolemia, unspecified: Secondary | ICD-10-CM

## 2016-01-20 MED ORDER — ATORVASTATIN CALCIUM 20 MG PO TABS: ORAL_TABLET | ORAL | Status: DC

## 2016-01-31 ENCOUNTER — Inpatient Hospital Stay
Admission: RE | Admit: 2016-01-31 | Discharge: 2016-01-31 | Disposition: A | Discharge status: Home / Self Care | Payer: Self-pay | Source: Ambulatory Visit | Attending: *Deleted | Admitting: *Deleted

## 2016-01-31 ENCOUNTER — Other Ambulatory Visit: Payer: Self-pay | Admitting: *Deleted

## 2016-01-31 DIAGNOSIS — Z9289 Personal history of other medical treatment: Secondary | ICD-10-CM

## 2016-02-01 ENCOUNTER — Ambulatory Visit
Admission: RE | Admit: 2016-02-01 | Discharge: 2016-02-01 | Disposition: A | Discharge status: Home / Self Care | Payer: Medicare Other | Source: Ambulatory Visit | Attending: Family Medicine | Admitting: Family Medicine

## 2016-02-01 ENCOUNTER — Other Ambulatory Visit: Payer: Self-pay | Admitting: Family Medicine

## 2016-02-01 DIAGNOSIS — M8580 Other specified disorders of bone density and structure, unspecified site: Secondary | ICD-10-CM | POA: Diagnosis not present

## 2016-02-01 DIAGNOSIS — Z1231 Encounter for screening mammogram for malignant neoplasm of breast: Secondary | ICD-10-CM | POA: Diagnosis present

## 2016-02-01 DIAGNOSIS — E2839 Other primary ovarian failure: Secondary | ICD-10-CM

## 2016-02-01 DIAGNOSIS — F17200 Nicotine dependence, unspecified, uncomplicated: Secondary | ICD-10-CM | POA: Insufficient documentation

## 2016-02-01 DIAGNOSIS — Z13820 Encounter for screening for osteoporosis: Secondary | ICD-10-CM | POA: Insufficient documentation

## 2016-07-03 ENCOUNTER — Ambulatory Visit (INDEPENDENT_AMBULATORY_CARE_PROVIDER_SITE_OTHER): Payer: Medicare Other | Admitting: Family Medicine

## 2016-07-03 ENCOUNTER — Other Ambulatory Visit: Payer: Self-pay | Admitting: Family Medicine

## 2016-07-03 ENCOUNTER — Encounter: Payer: Self-pay | Admitting: Family Medicine

## 2016-07-03 VITALS — BP 130/80 | HR 99 | Temp 98.3°F | Resp 17 | Ht 63.0 in | Wt 213.0 lb

## 2016-07-03 DIAGNOSIS — M797 Fibromyalgia: Secondary | ICD-10-CM | POA: Diagnosis not present

## 2016-07-03 DIAGNOSIS — G2581 Restless legs syndrome: Secondary | ICD-10-CM

## 2016-07-03 DIAGNOSIS — I878 Other specified disorders of veins: Secondary | ICD-10-CM

## 2016-07-03 DIAGNOSIS — IMO0001 Reserved for inherently not codable concepts without codable children: Secondary | ICD-10-CM

## 2016-07-03 DIAGNOSIS — R7302 Impaired glucose tolerance (oral): Secondary | ICD-10-CM | POA: Diagnosis not present

## 2016-07-03 DIAGNOSIS — E7800 Pure hypercholesterolemia, unspecified: Secondary | ICD-10-CM

## 2016-07-03 DIAGNOSIS — Z23 Encounter for immunization: Secondary | ICD-10-CM | POA: Diagnosis not present

## 2016-07-03 NOTE — Patient Instructions (Signed)
     IF you received an x-ray today, you will receive an invoice from [facility]. Please contact  Radiology at [phone] with questions or concerns regarding your invoice.   IF you received labwork today, you will receive an invoice from [facility]. Please contact [facility] at [phone] with questions or concerns regarding your invoice.   Our billing staff will not be able to assist you with questions regarding bills from these companies.  You will be contacted with the lab results as soon as they are available. The fastest way to get your results is to activate your My Chart account. Instructions are located on the last page of this paperwork. If you have not heard from us regarding the results in 2 weeks, please contact this office.      

## 2016-07-03 NOTE — Progress Notes (Signed)
By signing my name below, I, [name], attest that this documentation has been prepared under the direction and in the presence of [name], MD.  Electronically Signed: [name], Medical Scribe. 07/03/16. 1:43 PM.  Subjective:    Patient ID: Carol [name], female    DOB: Oct 30, 1945, 70 y.o.   MRN: [id number]  07/03/2016  Follow-up  HPI  HPI Comments: Carol [name] is a 70 y.o. female who presents to the [facility] for 6 month follow-up. Pt reports swelling and was told she could take Lasix increased in the summer; 2 in the morning, and 2 at night. Pt states she doesn't have as much swelling in the winter. Pt is compliant with her cholesterol medication.  Left Knee Pain: Pt reports tendonitis in her left knee  Anxiety: Pt will be taking care of some family members after surgery and tearfuly mentioned how "fibromyalgia is a terrible thing to have". Pt is no longer taking Zoloft. Pt states she is feeling fine. Depression screen [facility] 2/9 12/27/2015 06/15/2015 02/15/2014  Decreased Interest 3 0 1  Down, Depressed, Hopeless 0 0 0  PHQ - 2 Score 3 0 1  Altered sleeping 1 - -  Tired, decreased energy 3 - -  Change in appetite 3 - -  Feeling bad or failure about yourself  3 - -  Trouble concentrating 0 - -  Moving slowly or fidgety/restless 0 - -  Suicidal thoughts 0 - -  PHQ-9 Score 13 - -   Sleep: Pt is doing great since she got medication for her restless leg syndrome. Pt states she has doubled her dose and she's getting so much rest.  Pulmonary: Pt states her lungs are fine. Pt is not smoking, but she does electronic smoking. Pt's last cigarette was last year. Pt denies chest pain, coughing, constipation.  Eye: Pt mentions her allergies are always there. Pt is checked out by her ophthalmologist. Pt uses pataday.   GI: Pt has regular bm and uses swiss kriss, the herbal supplement, for regularity. Pt denies constipation.  Review of Systems    Constitutional: Negative for chills, diaphoresis, fatigue and fever.  Eyes: Negative for visual disturbance.  Respiratory: Negative for cough and shortness of breath.   Cardiovascular: Positive for leg swelling. Negative for chest pain and palpitations.  Gastrointestinal: Negative for abdominal pain, constipation, diarrhea, nausea and vomiting.  Endocrine: Negative for cold intolerance, heat intolerance, polydipsia, polyphagia and polyuria.  Musculoskeletal: Positive for arthralgias.  Allergic/Immunologic: Positive for environmental allergies.  Neurological: Negative for dizziness, tremors, seizures, syncope, facial asymmetry, speech difficulty, weakness, light-headedness, numbness and headaches.  Psychiatric/Behavioral: Negative for dysphoric mood and sleep disturbance.   Past Medical History:  Diagnosis Date  . Allergic rhinitis, cause unspecified   . Cataract   . Chicken pox   . Fibromyalgia   . HLD (hyperlipidemia)   . 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 Other abnormal glucose   . 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 Personal history of colonic polyps   . Swelling of limb   . Tobacco use disorder   . Unspecified sinusitis (chronic)    Past Surgical History:  Procedure Laterality Date  . APPENDECTOMY  06/1997  . BREAST BIOPSY Right 2002    core with clip  . COLONOSCOPY  10/15/2012   mutliple polyps.  Outlaw in [place].  Repeat 5 years.  . COLPOSCOPY  10/16/2011   HGSIL pap smear.  Negative.  Marland Kitchen EXTERNAL  EAR SURGERY  09/2008   external - not specified  (RIGHT)   No Known Allergies Current Outpatient Prescriptions  Medication Sig Dispense Refill  . ascorbic acid (VITAMIN C) 500 MG tablet Take 500 mg by mouth daily.    Marland Kitchen aspirin 81 MG tablet Take 162 mg by mouth daily.     Marland Kitchen atorvastatin (LIPITOR) 20 MG tablet TAKE ONE (1) TABLET BY MOUTH EVERY DAY 90 tablet 3  . Cetirizine HCl (ZYRTEC PO) Take 10 mg by mouth.    . Cholecalciferol  (VITAMIN D3) 2000 UNITS capsule Take 2,000 Units by mouth daily.      . diazepam (VALIUM) 2 MG tablet Take 1 tablet (2 mg total) by mouth every 8 (eight) hours as needed (dizziness/vertigo). 30 tablet 0  . fluticasone (FLONASE) 50 MCG/ACT nasal spray Place 2 sprays into both nostrils daily. 16 g 6  . furosemide (LASIX) 20 MG tablet TAKE ONE (1) TABLET BY MOUTH TWO (2) TIMES DAILY. 180 tablet 3  . Glucosamine Sulfate 1000 MG CAPS Take 1 capsule by mouth daily.      . Misc Natural Products (OSTEO BI-FLEX JOINT SHIELD) TABS Take 2 tablets by mouth daily. With magnesium     . Multiple Vitamin (MULTIVITAMIN) tablet Take 1 tablet by mouth daily.    . NON FORMULARY Swiss kirss herbalaxative     . rOPINIRole (REQUIP) 0.5 MG tablet Take 1 tablet (0.5 mg total) by mouth at bedtime. 90 tablet 3  . sertraline (ZOLOFT) 50 MG tablet Take 50 mg by mouth daily. Reported on 12/27/2015     No current facility-administered medications for this visit.    Social History   Social History  . Marital status: Married    Spouse name: N/A  . Number of children: 2  . Years of education: N/A   Occupational History  . homemaker    Social History Main Topics  . Smoking status: Former Smoker    Packs/day: 1.00    Years: 40.00    Types: Cigarettes    Quit date: 04/01/2012  . Smokeless tobacco: Not on file     Comment: 7-10 cigarettes for past 40 years   . Alcohol use Yes     Comment: occasional  5 drinks per year  . Drug use: No  . Sexual activity: Yes   Other Topics Concern  . Not on file   Social History Narrative   Marital status: married x 46 years; happily married; no abuse.      Children: 2 sons; one granddaughter (67).      Lives: with husband.      Employment: unemployed/homemaker.      Tobacco: quit smoking 03/2012.  Electronic cigarette.      Alcohol: socially; 2 glasses per year.      Exercise:  None/sporadic      Seatbelt:  Always uses seat belts.   Smoke alarm and carbon monoxide detector in  the home.       Guns:  No guns.      Caffeine [id number] 3 servings per day.      ADLs: independent with ADLs in 2017      Advanced Directives: yes; DNR/DNI   Family History  Problem Relation Age of Onset  . Arthritis    . Coronary artery disease    . Heart failure Father   . Hypertension Father   . Stroke Father   . Hyperlipidemia Father   . Heart failure Brother   . Diabetes Brother   .  Heart disease Brother   . Diabetes Brother   . Heart disease Brother   . Hyperlipidemia Brother   . Hypertension Brother   . Hypertension Sister   . Depression Sister   . Hypertension Sister   . Hyperlipidemia Sister   . Breast cancer Neg Hx        Objective:    BP 130/80 (BP Location: Right Arm, Patient Position: Sitting, Cuff Size: Normal)   Pulse 99   Temp 98.3 F (36.8 C) (Oral)   Resp 17   Ht 5\' 3"  (1.6 m)   Wt 213 lb (96.6 kg)   SpO2 97%   BMI 37.73 kg/m  Physical Exam  Constitutional: She is oriented to person, place, and time. She appears well-developed and well-nourished. No distress.  HENT:  Head: Normocephalic and atraumatic.  Right Ear: External ear normal.  Left Ear: External ear normal.  Nose: Nose normal.  Mouth/Throat: Oropharynx is clear and moist.  Eyes: Conjunctivae and EOM are normal. Pupils are equal, round, and reactive to light.  Neck: Normal range of motion. Neck supple. Carotid bruit is not present. No thyromegaly present.  Cardiovascular: Normal rate, regular rhythm, normal heart sounds and intact distal pulses.  Exam reveals no gallop and no friction rub.   No murmur heard. Trace non-pitting edema B lower extremities.  Pulmonary/Chest: Effort normal and breath sounds normal. She has no wheezes. She has no rales.  Abdominal: Soft. Bowel sounds are normal. She exhibits no distension and no mass. There is no tenderness. There is no rebound and no guarding.  Musculoskeletal: She exhibits edema.  Lymphadenopathy:    She has no cervical adenopathy.    Neurological: She is alert and oriented to person, place, and time. No cranial nerve deficit.  Skin: Skin is warm and dry. No rash noted. She is not diaphoretic. No erythema. No pallor.  Psychiatric: She has a normal mood and affect. Her behavior is normal.    Assessment & Plan:   1. Glucose intolerance (impaired glucose tolerance)   2. Fibromyalgia   3. Hyperlipidemia   4. Venous stasis   5. Restless leg syndrome   6. Obesity   7. Need for prophylactic vaccination and inoculation against influenza    -controlled; obtain labs in upcoming week; continue current medications. -recommend exercise and weight loss and low calorie low fat food choices.   Orders Placed This Encounter  Procedures  . Flu Vaccine QUAD 36+ mos IM   No orders of the defined types were placed in this encounter.   Return in about 6 months (around 12/31/2016) for complete physical examiniation.  I personally performed the services described in this documentation, which was scribed in my presence. The recorded information has been reviewed and considered.  Brek Reece Elayne Guerin, M.D. [facility] [address] [place], [place]  [address] [phone] phone [phone] fax

## 2016-07-06 ENCOUNTER — Other Ambulatory Visit (INDEPENDENT_AMBULATORY_CARE_PROVIDER_SITE_OTHER): Payer: Medicare Other | Admitting: Family Medicine

## 2016-07-06 DIAGNOSIS — R7302 Impaired glucose tolerance (oral): Secondary | ICD-10-CM

## 2016-07-06 DIAGNOSIS — E7800 Pure hypercholesterolemia, unspecified: Secondary | ICD-10-CM

## 2016-07-06 LAB — CBC WITH DIFFERENTIAL/PLATELET
Basophils Absolute: 75 cells/uL (ref 0–200)
Basophils Relative: 1 %
Eosinophils Absolute: 150 cells/uL (ref 15–500)
Eosinophils Relative: 2 %
HCT: 40.5 % (ref 35.0–45.0)
Hemoglobin: 14.1 g/dL (ref 11.7–15.5)
Lymphocytes Relative: 32 %
Lymphs Abs: 2400 cells/uL (ref 850–3900)
MCH: 30.9 pg (ref 27.0–33.0)
MCHC: 34.8 g/dL (ref 32.0–36.0)
MCV: 88.8 fL (ref 80.0–100.0)
MPV: 10.6 fL (ref 7.5–12.5)
Monocytes Absolute: 600 cells/uL (ref 200–950)
Monocytes Relative: 8 %
Neutro Abs: 4275 cells/uL (ref 1500–7800)
Neutrophils Relative %: 57 %
Platelets: 214 10*3/uL (ref 140–400)
RBC: 4.56 MIL/uL (ref 3.80–5.10)
RDW: 14.1 % (ref 11.0–15.0)
WBC: 7.5 10*3/uL (ref 3.8–10.8)

## 2016-07-06 LAB — LIPID PANEL
Cholesterol: 157 mg/dL (ref 125–200)
HDL: 50 mg/dL (ref >=46)
LDL Cholesterol: 70 mg/dL (ref <130)
Total CHOL/HDL Ratio: 3.1 Ratio (ref <=5.0)
Triglycerides: 185 mg/dL — ABNORMAL HIGH (ref <150)
VLDL: 37 mg/dL — ABNORMAL HIGH (ref <30)

## 2016-07-06 LAB — COMPREHENSIVE METABOLIC PANEL
ALT: 23 U/L (ref 6–29)
AST: 20 U/L (ref 10–35)
Albumin: 4.4 g/dL (ref 3.6–5.1)
Alkaline Phosphatase: 74 U/L (ref 33–130)
BUN: 10 mg/dL (ref 7–25)
CO2: 31 mmol/L (ref 20–31)
Calcium: 9.2 mg/dL (ref 8.6–10.4)
Chloride: 102 mmol/L (ref 98–110)
Creat: 1.05 mg/dL — ABNORMAL HIGH (ref 0.50–0.99)
Glucose, Bld: 123 mg/dL — ABNORMAL HIGH (ref 65–99)
Potassium: 4.4 mmol/L (ref 3.5–5.3)
Sodium: 143 mmol/L (ref 135–146)
Total Bilirubin: 0.7 mg/dL (ref 0.2–1.2)
Total Protein: 7 g/dL (ref 6.1–8.1)

## 2016-07-06 LAB — HEMOGLOBIN A1C
Hgb A1c MFr Bld: 5.7 % — ABNORMAL HIGH (ref <5.7)
Mean Plasma Glucose: 117 mg/dL

## 2016-08-03 NOTE — Progress Notes (Signed)
Lab visit only. 

## 2016-11-22 ENCOUNTER — Telehealth: Payer: Self-pay

## 2016-11-22 DIAGNOSIS — I878 Other specified disorders of veins: Secondary | ICD-10-CM

## 2016-11-22 DIAGNOSIS — G2581 Restless legs syndrome: Secondary | ICD-10-CM

## 2016-11-22 DIAGNOSIS — E7800 Pure hypercholesterolemia, unspecified: Secondary | ICD-10-CM

## 2016-11-22 NOTE — Telephone Encounter (Signed)
Patient needs her furosemide (LASIX) 20 MG tablet and her rOPINIRole (REQUIP) 0.5 MG tablet and atorvastatin (LIPITOR) 20 MG tablet refilled, she will need these refilled before her March 28th CPE with Dr Tamala Julian, She had to reschedule her CPE due to Dr Tamala Julian being out of town.  [facility].

## 2016-11-24 NOTE — Telephone Encounter (Signed)
Please refill as appropriate

## 2016-11-29 MED ORDER — ROPINIROLE HCL 0.5 MG PO TABS: 0.5000 mg | ORAL_TABLET | Freq: Every day | ORAL | 0 refills | Status: DC

## 2016-11-29 MED ORDER — FUROSEMIDE 20 MG PO TABS: ORAL_TABLET | ORAL | 0 refills | Status: DC

## 2016-11-29 MED ORDER — ATORVASTATIN CALCIUM 20 MG PO TABS: ORAL_TABLET | ORAL | 0 refills | Status: DC

## 2016-11-29 NOTE — Telephone Encounter (Signed)
Refills approved; pt notified through [place].

## 2016-11-29 NOTE — Addendum Note (Signed)
Addended by: [name] on: 11/29/2016 11:51 AM   Modules accepted: Orders

## 2017-01-08 ENCOUNTER — Encounter: Payer: Medicare Other | Admitting: Family Medicine

## 2017-01-09 ENCOUNTER — Encounter: Payer: Medicare Other | Admitting: Family Medicine

## 2017-03-02 ENCOUNTER — Other Ambulatory Visit: Payer: Self-pay | Admitting: Family Medicine

## 2017-03-02 DIAGNOSIS — G2581 Restless legs syndrome: Secondary | ICD-10-CM

## 2017-03-02 DIAGNOSIS — I878 Other specified disorders of veins: Secondary | ICD-10-CM

## 2017-03-06 ENCOUNTER — Encounter: Payer: Self-pay | Admitting: Family Medicine

## 2017-03-06 ENCOUNTER — Ambulatory Visit (INDEPENDENT_AMBULATORY_CARE_PROVIDER_SITE_OTHER): Payer: Medicare Other | Admitting: Family Medicine

## 2017-03-06 VITALS — BP 123/77 | HR 85 | Temp 97.8°F | Resp 16 | Ht 64.96 in | Wt 214.8 lb

## 2017-03-06 DIAGNOSIS — H8103 Meniere's disease, bilateral: Secondary | ICD-10-CM

## 2017-03-06 DIAGNOSIS — G2581 Restless legs syndrome: Secondary | ICD-10-CM | POA: Diagnosis not present

## 2017-03-06 DIAGNOSIS — Z860100 Personal history of colon polyps, unspecified: Secondary | ICD-10-CM

## 2017-03-06 DIAGNOSIS — E66812 Obesity, class 2: Secondary | ICD-10-CM

## 2017-03-06 DIAGNOSIS — J301 Allergic rhinitis due to pollen: Secondary | ICD-10-CM | POA: Diagnosis not present

## 2017-03-06 DIAGNOSIS — R7302 Impaired glucose tolerance (oral): Secondary | ICD-10-CM | POA: Diagnosis not present

## 2017-03-06 DIAGNOSIS — Z23 Encounter for immunization: Secondary | ICD-10-CM | POA: Diagnosis not present

## 2017-03-06 DIAGNOSIS — Z6837 Body mass index (BMI) 37.0-37.9, adult: Secondary | ICD-10-CM

## 2017-03-06 DIAGNOSIS — F419 Anxiety disorder, unspecified: Secondary | ICD-10-CM | POA: Diagnosis not present

## 2017-03-06 DIAGNOSIS — F329 Major depressive disorder, single episode, unspecified: Secondary | ICD-10-CM

## 2017-03-06 DIAGNOSIS — Z0000 Encounter for general adult medical examination without abnormal findings: Secondary | ICD-10-CM | POA: Diagnosis not present

## 2017-03-06 DIAGNOSIS — E7800 Pure hypercholesterolemia, unspecified: Secondary | ICD-10-CM

## 2017-03-06 DIAGNOSIS — M797 Fibromyalgia: Secondary | ICD-10-CM

## 2017-03-06 DIAGNOSIS — I878 Other specified disorders of veins: Secondary | ICD-10-CM | POA: Diagnosis not present

## 2017-03-06 DIAGNOSIS — F32A Depression, unspecified: Secondary | ICD-10-CM

## 2017-03-06 LAB — POCT URINALYSIS DIP (MANUAL ENTRY)
Bilirubin, UA: NEGATIVE
Blood, UA: NEGATIVE
Glucose, UA: NEGATIVE mg/dL
Ketones, POC UA: NEGATIVE mg/dL
Leukocytes, UA: NEGATIVE
Nitrite, UA: NEGATIVE
Protein Ur, POC: NEGATIVE mg/dL
Spec Grav, UA: 1.01 (ref 1.010–1.025)
Urobilinogen, UA: 0.2 E.U./dL
pH, UA: 5.5 (ref 5.0–8.0)

## 2017-03-06 MED ORDER — ZOSTER VAC RECOMB ADJUVANTED 50 MCG/0.5ML IM SUSR: 0.5000 mL | Freq: Once | INTRAMUSCULAR | 1 refills | Status: AC

## 2017-03-06 MED ORDER — POTASSIUM CHLORIDE CRYS ER 20 MEQ PO TBCR: 90.0000 meq | EXTENDED_RELEASE_TABLET | Freq: Every day | ORAL | 0 refills | Status: DC

## 2017-03-06 MED ORDER — ROPINIROLE HCL 0.5 MG PO TABS: 0.5000 mg | ORAL_TABLET | Freq: Every day | ORAL | 1 refills | Status: DC

## 2017-03-06 MED ORDER — FUROSEMIDE 20 MG PO TABS: 20.0000 mg | ORAL_TABLET | Freq: Two times a day (BID) | ORAL | 1 refills | Status: DC

## 2017-03-06 MED ORDER — ATORVASTATIN CALCIUM 20 MG PO TABS: ORAL_TABLET | ORAL | 1 refills | Status: DC

## 2017-03-06 NOTE — Progress Notes (Signed)
Subjective:    Patient ID: Carol [name], female    DOB: 03/11/1946, 71 y.o.   MRN: [id number]  03/06/2017  Annual Exam   HPI This 71 y.o. female presents for Annual Wellness Examination and Complete Physical Examination and follow-up of chronic medical conditions.  Last physical:  12-27-2015 Pap smear:  12-27-2015 WNL Mammogram:  01/2016 Colonoscopy:  02/17/2007 [facility]; colon polyps; 08/15/2012 by [facility]; colon polyps Bone density:  01/2016 Eye exam: due  Dental exam: every 3 months.  Immunization History  Administered Date(s) Administered  . Influenza Split 08/21/2010, 08/15/2014  . Influenza, Seasonal, Injecte, Preservative Fre 10/06/2012  . Influenza,inj,Quad PF,36+ Mos 07/03/2016  . Pneumococcal Conjugate-13 09/20/2014  . Pneumococcal Polysaccharide-23 10/06/2012  . Tdap 09/12/2011  . Zoster 10/28/2012   BP Readings from Last 3 Encounters:  03/06/17 123/77  07/03/16 130/80  01/17/16 118/72   Wt Readings from Last 3 Encounters:  03/06/17 214 lb 12.8 oz (97.4 kg)  07/03/16 213 lb (96.6 kg)  01/17/16 217 lb 12 oz (98.8 kg)    Hypercholesterolemia: Patient reports good compliance with medication, good tolerance to medication, and good symptom control.    Glucose intolerance: non-compliant with exercise, weight loss, and low-carbohydrate diet.  Venous stasis:  Patient reports good compliance with medication, good tolerance to medication, and good symptom control.    RLS: Patient reports good compliance with medication, good tolerance to medication, and good symptom control.    Review of Systems  Constitutional: Negative for activity change, appetite change, chills, diaphoresis, fatigue, fever and unexpected weight change.  HENT: Negative for congestion, dental problem, drooling, ear discharge, ear pain, facial swelling, hearing loss, mouth sores, nosebleeds, postnasal drip, rhinorrhea, sinus pressure, sneezing, sore throat, tinnitus, trouble swallowing and voice change.     Eyes: Negative for photophobia, pain, discharge, redness, itching and visual disturbance.  Respiratory: Negative for apnea, cough, choking, chest tightness, shortness of breath, wheezing and stridor.   Cardiovascular: Negative for chest pain, palpitations and leg swelling.  Gastrointestinal: Negative for abdominal distention, abdominal pain, anal bleeding, blood in stool, constipation, diarrhea, nausea, rectal pain and vomiting.  Endocrine: Negative for cold intolerance, heat intolerance, polydipsia, polyphagia and polyuria.  Genitourinary: Negative for decreased urine volume, difficulty urinating, dyspareunia, dysuria, enuresis, flank pain, frequency, genital sores, hematuria, menstrual problem, pelvic pain, urgency, vaginal bleeding, vaginal discharge and vaginal pain.       Nocturia x 0.  NO URINARY INCONTINENCE.   Musculoskeletal: Positive for arthralgias and myalgias. Negative for back pain, gait problem, joint swelling, neck pain and neck stiffness.  Skin: Negative for color change, pallor, rash and wound.  Allergic/Immunologic: Negative for environmental allergies, food allergies and immunocompromised state.  Neurological: Negative for dizziness, tremors, seizures, syncope, facial asymmetry, speech difficulty, weakness, light-headedness, numbness and headaches.  Hematological: Negative for adenopathy. Does not bruise/bleed easily.  Psychiatric/Behavioral: Negative for agitation, behavioral problems, confusion, decreased concentration, dysphoric mood, hallucinations, self-injury, sleep disturbance and suicidal ideas. The patient is not nervous/anxious and is not hyperactive.        Bedtime at 11:00pm.  Wakes up 4:00am-6:30am.    Past Medical History:  Diagnosis Date  . Allergic rhinitis, cause unspecified   . Cataract   . Chicken pox   . Fibromyalgia   . HLD (hyperlipidemia)   . 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 Other abnormal glucose   . Papanicolaou smear of cervix with high grade squamous  intraepithelial lesion (HGSIL)   . Personal history of colonic polyps   . Swelling of limb   .  Tobacco use disorder   . Unspecified sinusitis (chronic)    Past Surgical History:  Procedure Laterality Date  . APPENDECTOMY  06/1997  . BREAST BIOPSY Right 2002    core with clip  . COLONOSCOPY  10/15/2012   mutliple polyps.  [facility] in [place].  Repeat 5 years.  . COLPOSCOPY  10/16/2011   HGSIL pap smear.  Negative.  [facility] Kitchen EXTERNAL EAR SURGERY  09/2008   external - not specified  (RIGHT)   No Known Allergies  Social History   Social History  . Marital status: Married    Spouse name: N/A  . Number of children: 2  . Years of education: N/A   Occupational History  . homemaker    Social History Main Topics  . Smoking status: Former Smoker    Packs/day: 1.00    Years: 40.00    Types: Cigarettes    Quit date: 04/01/2012  . Smokeless tobacco: Never Used     Comment: 7-10 cigarettes for past 40 years   . Alcohol use Yes     Comment: occasional  5 drinks per year  . Drug use: No  . Sexual activity: Yes    Birth control/ protection: Post-menopausal   Other Topics Concern  . Not on file   Social History Narrative   Marital status: married x 51 years; happily married; no abuse.      Children: 2 sons; one granddaughter (49), 1 adopted grandson (1) in [place].      Lives: with husband.      Employment: unemployed/homemaker.      Tobacco: quit smoking 03/2012.  Electronic cigarette.      Alcohol: socially; 2 glasses per year.      Exercise:  None/sporadic      Seatbelt:  Always uses seat belts.   Smoke alarm and carbon monoxide detector in the home.       Guns:  No guns.      Caffeine HYW:VPXTGG 3 servings per day.      ADLs: independent with ADLs in 2018; drives; no assistant devices.       Advanced Directives: yes; DNR/DNI   Family History  Problem Relation Age of Onset  . Heart failure Father   . Hypertension Father   . Stroke Father   . Hyperlipidemia Father   . Heart  failure Brother   . Diabetes Brother   . Heart disease Brother   . Diabetes Brother   . Heart disease Brother   . Hypertension Sister   . Depression Sister   . Hypertension Sister   . Hyperlipidemia Sister   . Arthritis Unknown   . Coronary artery disease Unknown   . Hyperlipidemia Brother   . Hypertension Brother   . Breast cancer Neg Hx        Objective:    BP 123/77 (BP Location: Right Arm, Patient Position: Sitting, Cuff Size: Large)   Pulse 85   Temp 97.8 F (36.6 C) (Oral)   Resp 16   Ht 5' 4.96" (1.65 m)   Wt 214 lb 12.8 oz (97.4 kg)   SpO2 93%   BMI 35.79 kg/m  Physical Exam  Constitutional: She is oriented to person, place, and time. She appears well-developed and well-nourished. No distress.  HENT:  Head: Normocephalic and atraumatic.  Right Ear: External ear normal.  Left Ear: External ear normal.  Nose: Nose normal.  Mouth/Throat: Oropharynx is clear and moist.  Eyes: Conjunctivae and EOM are normal. Pupils are equal, round, and reactive  to light.  Neck: Normal range of motion and full passive range of motion without pain. Neck supple. No JVD present. Carotid bruit is not present. No thyromegaly present.  Cardiovascular: Normal rate, regular rhythm and normal heart sounds.  Exam reveals no gallop and no friction rub.   No murmur heard. Pulmonary/Chest: Effort normal and breath sounds normal. She has no wheezes. She has no rales. Right breast exhibits no inverted nipple, no mass, no nipple discharge, no skin change and no tenderness. Left breast exhibits no inverted nipple, no mass, no nipple discharge, no skin change and no tenderness. Breasts are symmetrical.  Abdominal: Soft. Bowel sounds are normal. She exhibits no distension and no mass. There is no tenderness. There is no rebound and no guarding.  Musculoskeletal:       Right shoulder: Normal.       Left shoulder: Normal.       Cervical back: Normal.  Lymphadenopathy:    She has no cervical adenopathy.   Neurological: She is alert and oriented to person, place, and time. She has normal reflexes. No cranial nerve deficit. She exhibits normal muscle tone. Coordination normal.  Skin: Skin is warm and dry. No rash noted. She is not diaphoretic. No erythema. No pallor.  Psychiatric: She has a normal mood and affect. Her behavior is normal. Judgment and thought content normal.  Nursing note and vitals reviewed.   Depression screen [facility] 2/9 03/06/2017 03/06/2017 12/27/2015 06/15/2015 02/15/2014  Decreased Interest 0 0 3 0 1  Down, Depressed, Hopeless 0 0 0 0 0  PHQ - 2 Score 0 0 3 0 1  Altered sleeping - - 1 - -  Tired, decreased energy - - 3 - -  Change in appetite - - 3 - -  Feeling bad or failure about yourself  - - 3 - -  Trouble concentrating - - 0 - -  Moving slowly or fidgety/restless - - 0 - -  Suicidal thoughts - - 0 - -  PHQ-9 Score - - 13 - -   Fall Risk  03/06/2017 03/06/2017 12/27/2015 06/15/2015 02/15/2014  Falls in the past year? No No No No No   Functional Status Survey: Is the patient deaf or have difficulty hearing?: Yes (some hearing loss, had ear surgery) Does the patient have difficulty seeing, even when wearing glasses/contacts?: Yes (occasional, due to cateract) Does the patient have difficulty concentrating, remembering, or making decisions?: No Does the patient have difficulty walking or climbing stairs?: Yes (ocasional) Does the patient have difficulty dressing or bathing?: Yes (occasional) Does the patient have difficulty doing errands alone such as visiting a doctor's office or shopping?: No     Assessment & Plan:   1. Encounter for Medicare annual wellness exam   2. Routine general medical examination at a health care facility   3. Seasonal allergic rhinitis due to pollen   4. Glucose intolerance (impaired glucose tolerance)   5. Anxiety and depression   6. Fibromyalgia   7. Pure hypercholesterolemia   8. Class 2 severe obesity due to excess calories with serious  comorbidity and body mass index (BMI) of 37.0 to 37.9 in adult ([place])   9. Restless leg syndrome   10. Venous stasis   11. History of colonic polyps   12. Need for shingles vaccine   13. Meniere's disease of both ears    -anticipatory guidance provided --- exercise, weight loss, safe driving practices, aspirin 81mg  daily. -obtain age appropriate screening labs and labs for chronic  disease management. -moderate fall risk; no evidence of depression; no evidence of hearing loss.  Discussed advanced directives and living will; also discussed end of life issues including code status.  -refer to gastroenterology for repeat colonoscopy. -no changes to medical management; refills provided.  Orders Placed This Encounter  Procedures  . CBC with Differential/Platelet  . Comprehensive metabolic panel    Order Specific Question:   Has the patient fasted?    Answer:   Yes  . Hemoglobin A1c  . Lipid panel    Order Specific Question:   Has the patient fasted?    Answer:   Yes  . Microalbumin, urine  . TSH  . Ambulatory referral to Gastroenterology    Referral Priority:   Routine    Referral Type:   Consultation    Referral Reason:   Specialty Services Required    Number of Visits Requested:   1  . POCT urinalysis dipstick   Meds ordered this encounter  Medications  . Zoster Vac Recomb Adjuvanted [facility]) injection    Sig: Inject 0.5 mLs into the muscle once.    Dispense:  0.5 mL    Refill:  1  . atorvastatin (LIPITOR) 20 MG tablet    Sig: TAKE ONE (1) TABLET BY MOUTH EVERY DAY    Dispense:  90 tablet    Refill:  1  . DISCONTD: furosemide (LASIX) 20 MG tablet    Sig: Take 1 tablet (20 mg total) by mouth 2 (two) times daily.    Dispense:  180 tablet    Refill:  1  . rOPINIRole (REQUIP) 0.5 MG tablet    Sig: Take 1 tablet (0.5 mg total) by mouth at bedtime.    Dispense:  90 tablet    Refill:  1  . potassium chloride SA (K-DUR,KLOR-CON) 20 MEQ tablet    Sig: Take 4.5 tablets (90 mEq  total) by mouth daily.    Dispense:  30 tablet    Refill:  0  . furosemide (LASIX) 20 MG tablet    Sig: Take 1 tablet (20 mg total) by mouth 2 (two) times daily.    Dispense:  200 tablet    Refill:  1    Return in about 6 months (around 09/06/2017) for recheck high cholesterol, prediabetes, leg swelling.   Dayna Geurts Elayne Guerin, M.D. Primary Care at [facility] [address] [place], [place]  [address] [phone] phone [phone] fax

## 2017-03-06 NOTE — Patient Instructions (Addendum)
   IF you received an x-ray today, you will receive an invoice from [facility]. Please contact [facility] at [phone] with questions or concerns regarding your invoice.   IF you received labwork today, you will receive an invoice from [facility]. Please contact [facility] at [phone] with questions or concerns regarding your invoice.   Our billing staff will not be able to assist you with questions regarding bills from these companies.  You will be contacted with the lab results as soon as they are available. The fastest way to get your results is to activate your My Chart account. Instructions are located on the last page of this paperwork. If you have not heard from us regarding the results in 2 weeks, please contact this office.      Preventive Care 65 Years and Older, Female Preventive care refers to lifestyle choices and visits with your health care provider that can promote health and wellness. What does preventive care include?  A yearly physical exam. This is also called an annual well check.  Dental exams once or twice a year.  Routine eye exams. Ask your health care provider how often you should have your eyes checked.  Personal lifestyle choices, including: ? Daily care of your teeth and gums. ? Regular physical activity. ? Eating a healthy diet. ? Avoiding tobacco and drug use. ? Limiting alcohol use. ? Practicing safe sex. ? Taking low-dose aspirin every day. ? Taking vitamin and mineral supplements as recommended by your health care provider. What happens during an annual well check? The services and screenings done by your health care provider during your annual well check will depend on your age, overall health, lifestyle risk factors, and family history of disease. Counseling Your health care provider may ask you questions about your:  Alcohol use.  Tobacco use.  Drug use.  Emotional well-being.  Home and relationship  well-being.  Sexual activity.  Eating habits.  History of falls.  Memory and ability to understand (cognition).  Work and work environment.  Reproductive health.  Screening You may have the following tests or measurements:  Height, weight, and BMI.  Blood pressure.  Lipid and cholesterol levels. These may be checked every 5 years, or more frequently if you are over 50 years old.  Skin check.  Lung cancer screening. You may have this screening every year starting at age 55 if you have a 30-pack-year history of smoking and currently smoke or have quit within the past 15 years.  Fecal occult blood test (FOBT) of the stool. You may have this test every year starting at age 50.  Flexible sigmoidoscopy or colonoscopy. You may have a sigmoidoscopy every 5 years or a colonoscopy every 10 years starting at age 50.  Hepatitis C blood test.  Hepatitis B blood test.  Sexually transmitted disease (STD) testing.  Diabetes screening. This is done by checking your blood sugar (glucose) after you have not eaten for a while (fasting). You may have this done every 1-3 years.  Bone density scan. This is done to screen for osteoporosis. You may have this done starting at age 65.  Mammogram. This may be done every 1-2 years. Talk to your health care provider about how often you should have regular mammograms.  Talk with your health care provider about your test results, treatment options, and if necessary, the need for more tests. Vaccines Your health care provider may recommend certain vaccines, such as:  Influenza vaccine. This is recommended every year.    Tetanus, diphtheria, and acellular pertussis (Tdap, Td) vaccine. You may need a Td booster every 10 years.  Varicella vaccine. You may need this if you have not been vaccinated.  Zoster vaccine. You may need this after age 70.  Measles, mumps, and rubella (MMR) vaccine. You may need at least one dose of MMR if you were born in 1957  or later. You may also need a second dose.  Pneumococcal 13-valent conjugate (PCV13) vaccine. One dose is recommended after age 32.  Pneumococcal polysaccharide (PPSV23) vaccine. One dose is recommended after age 59.  Meningococcal vaccine. You may need this if you have certain conditions.  Hepatitis A vaccine. You may need this if you have certain conditions or if you travel or work in places where you may be exposed to hepatitis A.  Hepatitis B vaccine. You may need this if you have certain conditions or if you travel or work in places where you may be exposed to hepatitis B.  Haemophilus influenzae type b (Hib) vaccine. You may need this if you have certain conditions. Talk to your health care provider about which screenings and vaccines you need and how often you need them. This information is not intended to replace advice given to you by your health care provider. Make sure you discuss any questions you have with your health care provider. Document Released: 10/28/2015 Document Revised: 06/20/2016 Document Reviewed: 08/02/2015 Elsevier Interactive Patient Education  2017 [employer].

## 2017-03-07 LAB — LIPID PANEL
Chol/HDL Ratio: 2.7 ratio (ref 0.0–4.4)
Cholesterol, Total: 131 mg/dL (ref 100–199)
HDL: 49 mg/dL (ref >39)
LDL Calculated: 58 mg/dL (ref 0–99)
Triglycerides: 122 mg/dL (ref 0–149)
VLDL Cholesterol Cal: 24 mg/dL (ref 5–40)

## 2017-03-07 LAB — CBC WITH DIFFERENTIAL/PLATELET
Basophils Absolute: 0 10*3/uL (ref 0.0–0.2)
Basos: 0 %
EOS (ABSOLUTE): 0.2 10*3/uL (ref 0.0–0.4)
Eos: 3 %
Hematocrit: 38.9 % (ref 34.0–46.6)
Hemoglobin: 13.3 g/dL (ref 11.1–15.9)
Immature Grans (Abs): 0 10*3/uL (ref 0.0–0.1)
Immature Granulocytes: 0 %
Lymphocytes Absolute: 1.6 10*3/uL (ref 0.7–3.1)
Lymphs: 26 %
MCH: 30.4 pg (ref 26.6–33.0)
MCHC: 34.2 g/dL (ref 31.5–35.7)
MCV: 89 fL (ref 79–97)
Monocytes Absolute: 0.6 10*3/uL (ref 0.1–0.9)
Monocytes: 9 %
Neutrophils Absolute: 3.9 10*3/uL (ref 1.4–7.0)
Neutrophils: 62 %
Platelets: 212 10*3/uL (ref 150–379)
RBC: 4.37 x10E6/uL (ref 3.77–5.28)
RDW: 14.3 % (ref 12.3–15.4)
WBC: 6.3 10*3/uL (ref 3.4–10.8)

## 2017-03-07 LAB — COMPREHENSIVE METABOLIC PANEL
ALT: 34 IU/L — ABNORMAL HIGH (ref 0–32)
AST: 27 IU/L (ref 0–40)
Albumin/Globulin Ratio: 1.8 (ref 1.2–2.2)
Albumin: 4.4 g/dL (ref 3.6–4.8)
Alkaline Phosphatase: 82 IU/L (ref 39–117)
BUN/Creatinine Ratio: 10 — ABNORMAL LOW (ref 12–28)
BUN: 10 mg/dL (ref 8–27)
Bilirubin Total: 0.6 mg/dL (ref 0.0–1.2)
CO2: 28 mmol/L (ref 18–29)
Calcium: 9.2 mg/dL (ref 8.7–10.3)
Chloride: 100 mmol/L (ref 96–106)
Creatinine, Ser: 0.98 mg/dL (ref 0.57–1.00)
GFR calc Af Amer: 68 mL/min/{1.73_m2} (ref >59)
GFR calc non Af Amer: 59 mL/min/{1.73_m2} — ABNORMAL LOW (ref >59)
Globulin, Total: 2.5 g/dL (ref 1.5–4.5)
Glucose: 119 mg/dL — ABNORMAL HIGH (ref 65–99)
Potassium: 4 mmol/L (ref 3.5–5.2)
Sodium: 143 mmol/L (ref 134–144)
Total Protein: 6.9 g/dL (ref 6.0–8.5)

## 2017-03-07 LAB — HEMOGLOBIN A1C
Est. average glucose Bld gHb Est-mCnc: 126 mg/dL
Hgb A1c MFr Bld: 6 % — ABNORMAL HIGH (ref 4.8–5.6)

## 2017-03-07 LAB — MICROALBUMIN, URINE: Microalbumin, Urine: <3 ug/mL

## 2017-03-07 LAB — TSH: TSH: 22.57 u[IU]/mL — ABNORMAL HIGH (ref 0.450–4.500)

## 2017-04-02 ENCOUNTER — Encounter: Payer: Self-pay | Admitting: Family Medicine

## 2017-04-14 MED ORDER — LEVOTHYROXINE SODIUM 50 MCG PO TABS: 50.0000 ug | ORAL_TABLET | Freq: Every day | ORAL | 1 refills | Status: DC

## 2017-04-14 NOTE — Addendum Note (Signed)
Addended by: [name] on: 04/14/2017 04:33 PM   Modules accepted: Orders

## 2017-08-12 ENCOUNTER — Telehealth: Payer: Self-pay | Admitting: Family Medicine

## 2017-08-12 NOTE — Telephone Encounter (Signed)
Pt is needing to talk with dr Tamala Julian regarding her shingles she was seen by [facility] in [place] and was give medicaiton valacyclovir hcl and is wanting to know if she can get one more round   Best number [phone]

## 2017-08-13 NOTE — Telephone Encounter (Signed)
Pt calling back about RX for shingles she would like RX to go to [facility] in [place]

## 2017-08-14 NOTE — Telephone Encounter (Signed)
Spoke with pt - advised to call pharmacy to get refilled by the providers that saw her originally.  Dr. Tamala Julian could not call back as she did not diagnose the shingles.  Pt was quite agitated that it took 2 days to call her back.  I apologized.

## 2017-08-20 ENCOUNTER — Other Ambulatory Visit: Payer: Self-pay | Admitting: Family Medicine

## 2017-08-20 DIAGNOSIS — Z1231 Encounter for screening mammogram for malignant neoplasm of breast: Secondary | ICD-10-CM

## 2017-08-22 ENCOUNTER — Telehealth: Payer: Self-pay

## 2017-08-22 NOTE — Telephone Encounter (Signed)
Patient would like to establish care with you. She is Milan Murray's twin sister. Let me know if it is okay to schedule.

## 2017-08-22 NOTE — Telephone Encounter (Deleted)
Copied from [place] [id number]. Topic: Inquiry >> Aug 22, 2017  1:19 PM Cecelia Byars, NT wrote: Reason for CRM: this patient is the twin sister of  Shara Blazing she is a patient of [facility] and wants to change care to her, she is not taking new patients

## 2017-08-22 NOTE — Telephone Encounter (Signed)
Ok

## 2017-08-23 ENCOUNTER — Encounter: Payer: Self-pay | Admitting: Physician Assistant

## 2017-08-23 ENCOUNTER — Ambulatory Visit: Payer: Medicare Other | Admitting: Physician Assistant

## 2017-08-23 ENCOUNTER — Other Ambulatory Visit: Payer: Self-pay

## 2017-08-23 ENCOUNTER — Telehealth: Payer: Self-pay | Admitting: Internal Medicine

## 2017-08-23 VITALS — BP 120/80 | HR 47 | Temp 98.1°F | Resp 16 | Ht 62.5 in | Wt 217.8 lb

## 2017-08-23 DIAGNOSIS — B029 Zoster without complications: Secondary | ICD-10-CM

## 2017-08-23 NOTE — Telephone Encounter (Signed)
Dr. Derrel Nip ok'd pt to est. Care with her.

## 2017-08-23 NOTE — Progress Notes (Signed)
Carol [name]  MRN: [id number] DOB: 04-23-1946  PCP: Crecencio Mc, MD  Chief Complaint  Patient presents with  . Shingles    under left breast, around to back, seen at [facility] in [place], [place] on 08/06/17, "better, not gone"    Subjective:  Pt presents to clinic for concerns about her shingles that was diagnosed on 10/23 - she was put on valtrex 1g 1 po tid for 7 days.  When she was diagnosed she had pain and overall pain is getting better but she is still worried about the rash as it is not gone yet.    History is obtained by patient.  Review of Systems  Constitutional: Negative for chills and fever.  Skin: Positive for wound (left mid rib cage - unilateral).    Patient Active Problem List   Diagnosis Date Noted  . History of colonic polyps 03/06/2017  . Obesity 01/17/2016  . Allergic rhinitis due to pollen 06/15/2015  . Restless leg syndrome 06/15/2015  . Glucose intolerance (impaired glucose tolerance) 05/06/2015  . Fibromyalgia 05/06/2015  . Routine general medical examination at a health care facility 10/06/2012  . Pure hypercholesterolemia 10/06/2012  . Anxiety and depression 10/06/2012  . Venous stasis 10/06/2012  . Hyperlipidemia 09/05/2010  . DYSPNEA 09/05/2010    Current Outpatient Medications on File Prior to Visit  Medication Sig Dispense Refill  . aspirin 81 MG tablet Take 162 mg by mouth daily.     Marland Kitchen atorvastatin (LIPITOR) 20 MG tablet TAKE ONE (1) TABLET BY MOUTH EVERY DAY 90 tablet 1  . Cetirizine HCl (ZYRTEC PO) Take 10 mg by mouth.    . Cholecalciferol (VITAMIN D3) 2000 UNITS capsule Take 2,000 Units by mouth daily.      . furosemide (LASIX) 20 MG tablet Take 1 tablet (20 mg total) by mouth 2 (two) times daily. 200 tablet 1  . Glucosamine Sulfate 1000 MG CAPS Take 1 capsule by mouth daily.      Marland Kitchen levothyroxine (SYNTHROID, LEVOTHROID) 50 MCG tablet Take 1 tablet (50 mcg total) by mouth daily. 90 tablet 1  . Misc Natural Products (OSTEO BI-FLEX  JOINT SHIELD) TABS Take 2 tablets by mouth daily. With magnesium     . Multiple Vitamin (MULTIVITAMIN) tablet Take 1 tablet by mouth daily.    . NON FORMULARY Swiss kirss herbalaxative     . potassium chloride SA (K-DUR,KLOR-CON) 20 MEQ tablet Take 4.5 tablets (90 mEq total) by mouth daily. 30 tablet 0  . rOPINIRole (REQUIP) 0.5 MG tablet Take 1 tablet (0.5 mg total) by mouth at bedtime. 90 tablet 1  . valACYclovir (VALTREX) 1000 MG tablet Take 1,000 mg 2 (two) times daily by mouth.    Marland Kitchen ascorbic acid (VITAMIN C) 500 MG tablet Take 500 mg by mouth daily.    . fluticasone (FLONASE) 50 MCG/ACT nasal spray Place 2 sprays into both nostrils daily. (Patient not taking: Reported on 08/23/2017) 16 g 6   No current facility-administered medications on file prior to visit.     No Known Allergies  Past Medical History:  Diagnosis Date  . Allergic rhinitis, cause unspecified   . Cataract   . Chicken pox   . Fibromyalgia   . HLD (hyperlipidemia)   . 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 Other abnormal glucose   . 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 Personal history of colonic polyps   . Swelling of limb   . Tobacco use disorder   .  Unspecified sinusitis (chronic)    Social History   Social History Narrative   Marital status: married x 79 years; happily married; no abuse.      Children: 2 sons; one granddaughter (35), 1 adopted grandson (1) in [place].      Lives: with husband.      Employment: unemployed/homemaker.      Tobacco: quit smoking 03/2012.  Electronic cigarette.      Alcohol: socially; 2 glasses per year.      Exercise:  None/sporadic      Seatbelt:  Always uses seat belts.   Smoke alarm and carbon monoxide detector in the home.       Guns:  No guns.      Caffeine OMV:EHMCNO 3 servings per day.      ADLs: independent with ADLs in 2018; drives; no assistant devices.       Advanced Directives: yes; DNR/DNI   Social History   Tobacco  Use  . Smoking status: Former Smoker    Packs/day: 1.00    Years: 40.00    Pack years: 40.00    Types: Cigarettes    Last attempt to quit: 04/01/2012    Years since quitting: 5.3  . Smokeless tobacco: Never Used  . Tobacco comment: 7-10 cigarettes for past 40 years   Substance Use Topics  . Alcohol use: Yes    Comment: occasional  5 drinks per year  . Drug use: No   family history includes Arthritis in her unknown relative; Coronary artery disease in her unknown relative; Depression in her sister; Diabetes in her brother and brother; Heart disease in her brother and brother; Heart failure in her brother and father; Hyperlipidemia in her brother, father, and sister; Hypertension in her brother, father, sister, and sister; Stroke in her father.     Objective:  BP 120/80   Pulse (!) 47   Temp 98.1 F (36.7 C) (Oral)   Resp 16   Ht 5' 2.5" (1.588 m)   Wt 217 lb 12.8 oz (98.8 kg)   SpO2 97%   BMI 39.20 kg/m  Body mass index is 39.2 kg/m.  Physical Exam  Constitutional: She is oriented to person, place, and time and well-developed, well-nourished, and in no distress.  HENT:  Head: Normocephalic and atraumatic.  Right Ear: Hearing and external ear normal.  Left Ear: Hearing and external ear normal.  Eyes: Conjunctivae are normal.  Neck: Normal range of motion.  Pulmonary/Chest: Effort normal.  Neurological: She is alert and oriented to person, place, and time. Gait normal.  Skin: Skin is warm and dry. Rash (healing rash without scabs on the left mid chest/back that does not extend pass midline - no open wounds or scab present) noted.  Psychiatric: Mood, memory, affect and judgment normal.  Vitals reviewed.   Assessment and Plan :  Herpes zoster without complication - in healing phases - no additional medication needed. D/w pt timing of shingles vaccine with her recent outbreak.  Windell Hummingbird PA-C  [facility] 08/23/2017 12:22 PM

## 2017-08-23 NOTE — Patient Instructions (Signed)
     IF you received an x-ray today, you will receive an invoice from [facility]. Please contact [facility] at [phone] with questions or concerns regarding your invoice.   IF you received labwork today, you will receive an invoice from [facility]. Please contact [facility] at [phone] with questions or concerns regarding your invoice.   Our billing staff will not be able to assist you with questions regarding bills from these companies.  You will be contacted with the lab results as soon as they are available. The fastest way to get your results is to activate your My Chart account. Instructions are located on the last page of this paperwork. If you have not heard from us regarding the results in 2 weeks, please contact this office.     

## 2017-08-23 NOTE — Telephone Encounter (Signed)
Please advise 

## 2017-09-10 ENCOUNTER — Ambulatory Visit: Payer: Medicare Other | Admitting: Family Medicine

## 2017-09-11 ENCOUNTER — Ambulatory Visit: Payer: Medicare Other | Admitting: Family Medicine

## 2017-09-30 ENCOUNTER — Ambulatory Visit: Payer: Medicare Other | Admitting: Internal Medicine

## 2017-09-30 ENCOUNTER — Encounter: Payer: Self-pay | Admitting: Internal Medicine

## 2017-09-30 VITALS — BP 122/70 | HR 94 | Temp 98.2°F | Resp 16 | Ht 61.75 in | Wt 221.2 lb

## 2017-09-30 DIAGNOSIS — R7303 Prediabetes: Secondary | ICD-10-CM

## 2017-09-30 DIAGNOSIS — R7302 Impaired glucose tolerance (oral): Secondary | ICD-10-CM

## 2017-09-30 DIAGNOSIS — E7800 Pure hypercholesterolemia, unspecified: Secondary | ICD-10-CM | POA: Diagnosis not present

## 2017-09-30 DIAGNOSIS — Z6837 Body mass index (BMI) 37.0-37.9, adult: Secondary | ICD-10-CM | POA: Diagnosis not present

## 2017-09-30 DIAGNOSIS — E7849 Other hyperlipidemia: Secondary | ICD-10-CM

## 2017-09-30 DIAGNOSIS — E039 Hypothyroidism, unspecified: Secondary | ICD-10-CM | POA: Diagnosis not present

## 2017-09-30 DIAGNOSIS — Z79899 Other long term (current) drug therapy: Secondary | ICD-10-CM

## 2017-09-30 DIAGNOSIS — B0229 Other postherpetic nervous system involvement: Secondary | ICD-10-CM | POA: Insufficient documentation

## 2017-09-30 NOTE — Assessment & Plan Note (Addendum)
Left side under breast.  .  Discussed getting shingrx In April. She has persistent pain but declines trial of gabapentin
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
## 2017-09-30 NOTE — Patient Instructions (Addendum)
Your  fasting glucose has never been  diagnostic of diabetes; but your last A1c of 6.0  continues to suggest that  you are at risk for developing type 2 Diabetes.     You may be gaining weight because your thyroid is underactive   Return for fasting labs at your convenience

## 2017-10-01 ENCOUNTER — Encounter: Payer: Self-pay | Admitting: Internal Medicine

## 2017-10-01 DIAGNOSIS — E039 Hypothyroidism, unspecified: Secondary | ICD-10-CM | POA: Insufficient documentation

## 2017-10-01 NOTE — Assessment & Plan Note (Signed)
Diagnosed with TSH of 23 in May,  Currently untreated due to patient noncompliance with medication.  Her weight gain and fatigue are likely aggravated by this condition. Encouraged to return asap for fasting labs    Lab Results  Component Value Date   TSH 22.570 (H) 03/06/2017

## 2017-10-01 NOTE — Assessment & Plan Note (Signed)
Borderline diabetic, recommended weight loss,strict low carbohydrate diet, and exercise. Lab Results  Component Value Date   HGBA1C 6.0 (H) 03/06/2017

## 2017-10-01 NOTE — Assessment & Plan Note (Signed)
She is tolerating atorvastatin without side effects.  She is due for fasting labs No changes to the medications were made.  Lab Results  Component Value Date   CHOL 131 03/06/2017   HDL 49 03/06/2017   LDLCALC 58 03/06/2017   TRIG 122 03/06/2017   CHOLHDL 2.7 03/06/2017   Lab Results  Component Value Date   ALT 34 (H) 03/06/2017   AST 27 03/06/2017   ALKPHOS 82 03/06/2017   BILITOT 0.6 03/06/2017

## 2017-10-01 NOTE — Assessment & Plan Note (Signed)
Given her history of prediabetes, I have addressed  BMI and recommended wt loss of 10% of body weight over the next 6 months using a low fat, low starch, high protein  fruit/vegetable based Mediterranean diet and 30 minutes of aerobic exercise a minimum of 5 days per week.

## 2017-10-02 ENCOUNTER — Other Ambulatory Visit (INDEPENDENT_AMBULATORY_CARE_PROVIDER_SITE_OTHER): Payer: Medicare Other

## 2017-10-02 DIAGNOSIS — E039 Hypothyroidism, unspecified: Secondary | ICD-10-CM | POA: Diagnosis not present

## 2017-10-02 DIAGNOSIS — Z79899 Other long term (current) drug therapy: Secondary | ICD-10-CM | POA: Diagnosis not present

## 2017-10-02 DIAGNOSIS — E7849 Other hyperlipidemia: Secondary | ICD-10-CM

## 2017-10-02 DIAGNOSIS — R7303 Prediabetes: Secondary | ICD-10-CM

## 2017-10-02 LAB — COMPREHENSIVE METABOLIC PANEL
ALT: 38 U/L — ABNORMAL HIGH (ref 0–35)
AST: 32 U/L (ref 0–37)
Albumin: 4.1 g/dL (ref 3.5–5.2)
Alkaline Phosphatase: 86 U/L (ref 39–117)
BUN: 8 mg/dL (ref 6–23)
CO2: 32 mEq/L (ref 19–32)
Calcium: 9.3 mg/dL (ref 8.4–10.5)
Chloride: 103 mEq/L (ref 96–112)
Creatinine, Ser: 0.97 mg/dL (ref 0.40–1.20)
GFR: 60.25 mL/min (ref >60.00)
Glucose, Bld: 137 mg/dL — ABNORMAL HIGH (ref 70–99)
Potassium: 4.4 mEq/L (ref 3.5–5.1)
Sodium: 142 mEq/L (ref 135–145)
Total Bilirubin: 1 mg/dL (ref 0.2–1.2)
Total Protein: 7.1 g/dL (ref 6.0–8.3)

## 2017-10-02 LAB — LIPID PANEL
Cholesterol: 132 mg/dL (ref 0–200)
HDL: 48.7 mg/dL (ref >39.00)
LDL Cholesterol: 51 mg/dL (ref 0–99)
NonHDL: 83.35
Total CHOL/HDL Ratio: 3
Triglycerides: 162 mg/dL — ABNORMAL HIGH (ref 0.0–149.0)
VLDL: 32.4 mg/dL (ref 0.0–40.0)

## 2017-10-02 LAB — TSH: TSH: 10.74 u[IU]/mL — ABNORMAL HIGH (ref 0.35–4.50)

## 2017-10-02 LAB — HEMOGLOBIN A1C: Hgb A1c MFr Bld: 6.3 % (ref 4.6–6.5)

## 2017-10-02 NOTE — Addendum Note (Signed)
Addended by: Arby Barrette on: 10/02/2017 10:38 AM   Modules accepted: Orders

## 2017-10-05 ENCOUNTER — Other Ambulatory Visit: Payer: Self-pay | Admitting: Internal Medicine

## 2017-10-05 ENCOUNTER — Encounter: Payer: Self-pay | Admitting: Internal Medicine

## 2017-10-05 DIAGNOSIS — R748 Abnormal levels of other serum enzymes: Secondary | ICD-10-CM

## 2017-10-05 DIAGNOSIS — E039 Hypothyroidism, unspecified: Secondary | ICD-10-CM

## 2017-10-11 ENCOUNTER — Ambulatory Visit
Admission: RE | Admit: 2017-10-11 | Discharge: 2017-10-11 | Disposition: A | Discharge status: Home / Self Care | Payer: Medicare Other | Source: Ambulatory Visit | Attending: Family Medicine | Admitting: Family Medicine

## 2017-10-11 DIAGNOSIS — Z1231 Encounter for screening mammogram for malignant neoplasm of breast: Secondary | ICD-10-CM | POA: Diagnosis not present

## 2017-10-14 ENCOUNTER — Other Ambulatory Visit: Payer: Self-pay | Admitting: Family Medicine

## 2017-10-14 DIAGNOSIS — N6320 Unspecified lump in the left breast, unspecified quadrant: Secondary | ICD-10-CM

## 2017-10-14 DIAGNOSIS — N6310 Unspecified lump in the right breast, unspecified quadrant: Secondary | ICD-10-CM

## 2017-10-14 DIAGNOSIS — R928 Other abnormal and inconclusive findings on diagnostic imaging of breast: Secondary | ICD-10-CM

## 2017-10-15 DIAGNOSIS — C50919 Malignant neoplasm of unspecified site of unspecified female breast: Secondary | ICD-10-CM

## 2017-10-15 DIAGNOSIS — Z923 Personal history of irradiation: Secondary | ICD-10-CM

## 2017-10-15 HISTORY — DX: Malignant neoplasm of unspecified site of unspecified female breast: C50.919

## 2017-10-15 HISTORY — DX: Personal history of irradiation: Z92.3

## 2017-10-24 ENCOUNTER — Other Ambulatory Visit: Payer: Medicare Other

## 2017-10-24 ENCOUNTER — Ambulatory Visit: Payer: Medicare Other

## 2017-11-01 ENCOUNTER — Ambulatory Visit
Admission: RE | Admit: 2017-11-01 | Discharge: 2017-11-01 | Disposition: A | Discharge status: Home / Self Care | Payer: Medicare Other | Source: Ambulatory Visit | Attending: Family Medicine | Admitting: Family Medicine

## 2017-11-01 DIAGNOSIS — N6002 Solitary cyst of left breast: Secondary | ICD-10-CM | POA: Diagnosis not present

## 2017-11-01 DIAGNOSIS — N6310 Unspecified lump in the right breast, unspecified quadrant: Secondary | ICD-10-CM

## 2017-11-01 DIAGNOSIS — R928 Other abnormal and inconclusive findings on diagnostic imaging of breast: Secondary | ICD-10-CM

## 2017-11-01 DIAGNOSIS — N6311 Unspecified lump in the right breast, upper outer quadrant: Secondary | ICD-10-CM | POA: Diagnosis not present

## 2017-11-01 DIAGNOSIS — N6313 Unspecified lump in the right breast, lower outer quadrant: Secondary | ICD-10-CM | POA: Insufficient documentation

## 2017-11-01 DIAGNOSIS — N6320 Unspecified lump in the left breast, unspecified quadrant: Secondary | ICD-10-CM

## 2017-11-04 ENCOUNTER — Other Ambulatory Visit: Payer: Self-pay | Admitting: Family Medicine

## 2017-11-04 DIAGNOSIS — R928 Other abnormal and inconclusive findings on diagnostic imaging of breast: Secondary | ICD-10-CM

## 2017-11-04 DIAGNOSIS — N6310 Unspecified lump in the right breast, unspecified quadrant: Secondary | ICD-10-CM

## 2017-11-12 ENCOUNTER — Telehealth: Payer: Self-pay | Admitting: Internal Medicine

## 2017-11-12 DIAGNOSIS — E7800 Pure hypercholesterolemia, unspecified: Secondary | ICD-10-CM

## 2017-11-12 NOTE — Telephone Encounter (Signed)
Copied from [place] [phone]. Topic: Inquiry >> Nov 12, 2017  3:55 PM Conception Chancy, NT wrote: Radonna Ricker is calling from [facility] and received orders from [place] on 10/14/17 and is needing further information. Would like a call back.  Cb# [phone]

## 2017-11-12 NOTE — Telephone Encounter (Signed)
Copied from [place] [id number]. Topic: General - Other >> Nov 12, 2017 12:28 PM Darl Householder, RMA wrote: Reason for CRM: Medication refill request for atorvastatin 20 mg [facility]

## 2017-11-13 MED ORDER — ATORVASTATIN CALCIUM 20 MG PO TABS: ORAL_TABLET | ORAL | 1 refills | Status: DC

## 2017-11-13 NOTE — Telephone Encounter (Signed)
rx has been faxed to [facility].

## 2017-11-13 NOTE — Telephone Encounter (Signed)
Returned call.  LMOVM to let us know what they need.

## 2017-11-18 ENCOUNTER — Telehealth: Payer: Self-pay | Admitting: Internal Medicine

## 2017-11-18 ENCOUNTER — Other Ambulatory Visit (INDEPENDENT_AMBULATORY_CARE_PROVIDER_SITE_OTHER): Payer: Medicare Other

## 2017-11-18 DIAGNOSIS — E039 Hypothyroidism, unspecified: Secondary | ICD-10-CM

## 2017-11-18 DIAGNOSIS — R748 Abnormal levels of other serum enzymes: Secondary | ICD-10-CM | POA: Diagnosis not present

## 2017-11-18 DIAGNOSIS — N63 Unspecified lump in unspecified breast: Secondary | ICD-10-CM

## 2017-11-18 DIAGNOSIS — Z79899 Other long term (current) drug therapy: Secondary | ICD-10-CM

## 2017-11-18 LAB — HEPATIC FUNCTION PANEL
ALT: 44 U/L — ABNORMAL HIGH (ref 0–35)
AST: 30 U/L (ref 0–37)
Albumin: 4.1 g/dL (ref 3.5–5.2)
Alkaline Phosphatase: 84 U/L (ref 39–117)
Bilirubin, Direct: 0.2 mg/dL (ref 0.0–0.3)
Total Bilirubin: 0.9 mg/dL (ref 0.2–1.2)
Total Protein: 7.1 g/dL (ref 6.0–8.3)

## 2017-11-18 LAB — TSH: TSH: 3.82 u[IU]/mL (ref 0.35–4.50)

## 2017-11-18 LAB — MICROALBUMIN / CREATININE URINE RATIO
Creatinine,U: 242.1 mg/dL
Microalb Creat Ratio: 0.8 mg/g (ref 0.0–30.0)
Microalb, Ur: 1.9 mg/dL (ref 0.0–1.9)

## 2017-11-18 NOTE — Telephone Encounter (Signed)
I recommend she see a breast surgeon .  I recommend dr Job Founds at [facility].  He will review all images with her and decide if both or just one breast needs a biopsy

## 2017-11-18 NOTE — Telephone Encounter (Signed)
Pt has been advised of message below. Pt gave a verbal understanding.

## 2017-11-18 NOTE — Telephone Encounter (Signed)
Please advise 

## 2017-11-18 NOTE — Telephone Encounter (Signed)
  Your referral is in process as requested to Dr. Bary Castilla.   Our referral coordinator will call you when the appointment has been made.  If you do not hear from [facility] in our office in a week,  Please call us back

## 2017-11-18 NOTE — Telephone Encounter (Signed)
Spoke with pt and informed her of your recommendations. The pt stated that she would like to go forward with the referral to Dr. Bary Castilla.

## 2017-11-18 NOTE — Telephone Encounter (Signed)
Pt would like you to look over her Mamm results before her appt on Thursday  and give her your opinion on what the next step would be.Carol [name]

## 2017-11-19 ENCOUNTER — Encounter: Payer: Self-pay | Admitting: Internal Medicine

## 2017-11-20 ENCOUNTER — Other Ambulatory Visit (HOSPITAL_COMMUNITY)
Admission: RE | Admit: 2017-11-20 | Discharge: 2017-11-20 | Disposition: A | Discharge status: Home / Self Care | Payer: Medicare Other | Source: Ambulatory Visit | Attending: Internal Medicine | Admitting: Internal Medicine

## 2017-11-20 ENCOUNTER — Encounter: Payer: Self-pay | Admitting: Internal Medicine

## 2017-11-20 ENCOUNTER — Ambulatory Visit: Payer: Medicare Other | Admitting: Internal Medicine

## 2017-11-20 VITALS — BP 116/72 | HR 104 | Temp 98.2°F | Resp 15 | Ht 61.75 in | Wt 217.4 lb

## 2017-11-20 DIAGNOSIS — E039 Hypothyroidism, unspecified: Secondary | ICD-10-CM | POA: Diagnosis not present

## 2017-11-20 DIAGNOSIS — R7303 Prediabetes: Secondary | ICD-10-CM | POA: Diagnosis not present

## 2017-11-20 DIAGNOSIS — Z124 Encounter for screening for malignant neoplasm of cervix: Secondary | ICD-10-CM

## 2017-11-20 DIAGNOSIS — I878 Other specified disorders of veins: Secondary | ICD-10-CM | POA: Diagnosis not present

## 2017-11-20 DIAGNOSIS — E782 Mixed hyperlipidemia: Secondary | ICD-10-CM | POA: Diagnosis not present

## 2017-11-20 DIAGNOSIS — G2581 Restless legs syndrome: Secondary | ICD-10-CM

## 2017-11-20 DIAGNOSIS — Z0001 Encounter for general adult medical examination with abnormal findings: Secondary | ICD-10-CM | POA: Diagnosis not present

## 2017-11-20 DIAGNOSIS — N6310 Unspecified lump in the right breast, unspecified quadrant: Secondary | ICD-10-CM

## 2017-11-20 DIAGNOSIS — R740 Nonspecific elevation of levels of transaminase and lactic acid dehydrogenase [LDH]: Secondary | ICD-10-CM

## 2017-11-20 DIAGNOSIS — Z23 Encounter for immunization: Secondary | ICD-10-CM

## 2017-11-20 DIAGNOSIS — R7401 Elevation of levels of liver transaminase levels: Secondary | ICD-10-CM

## 2017-11-20 MED ORDER — FUROSEMIDE 20 MG PO TABS: 20.0000 mg | ORAL_TABLET | Freq: Two times a day (BID) | ORAL | 1 refills | Status: DC

## 2017-11-20 MED ORDER — ROPINIROLE HCL 0.5 MG PO TABS: 0.5000 mg | ORAL_TABLET | Freq: Every day | ORAL | 1 refills | Status: DC

## 2017-11-20 NOTE — Progress Notes (Signed)
Patient ID: Carol [name], female    DOB: Mar 05, 1946  Age: 72 y.o. MRN: [id number]  The patient is here for annual preventive  examination and management of other chronic and acute problems.  Due for colonoscopy 2018 Mammogram done.  Abnormal.  Sees [facility] tomorrow. Normal mammogram April 2017  Requesting PAP smear..due to reported history of an abnormal one In the last ten years which reverted to normal    Has not had influenza vaccine , advised to get it today.    The risk factors are reflected in the social history.  The roster of all physicians providing medical care to patient - is listed in the Snapshot section of the chart.  Activities of daily living:  The patient is 100% independent in all ADLs: dressing, toileting, feeding as well as independent mobility  Home safety : The patient has smoke detectors in the home. They wear seatbelts.  There are no firearms at home. There is no violence in the home.   There is no risks for hepatitis, STDs or HIV. There is no   history of blood transfusion. They have no travel history to infectious disease endemic areas of the world.  The patient has seen their dentist in the last six month. They have seen their eye doctor in the last year. They admit to slight hearing difficulty with regard to whispered voices and some television programs.  They have deferred audiologic testing in the last year.  They do not  have excessive sun exposure. Discussed the need for sun protection: hats, long sleeves and use of sunscreen if there is significant sun exposure.   Diet: the importance of a healthy diet is discussed. They do have a healthy diet.  The benefits of regular aerobic exercise were discussed. She walks 4 times per week ,  20 minutes.   Depression screen: there are no signs or vegative symptoms of depression- irritability, change in appetite, anhedonia, sadness/tearfullness.  Cognitive assessment: the patient manages all their financial and  personal affairs and is actively engaged. They could relate day,date,year and events; recalled 2/3 objects at 3 minutes; performed clock-face test normally.  The following portions of the patient's history were reviewed and updated as appropriate: allergies, current medications, past family history, past medical history,  past surgical history, past social history  and problem list.  Visual acuity was not assessed per patient preference since she has regular follow up with her ophthalmologist. Hearing and body mass index were assessed and reviewed.   During the course of the visit the patient was educated and counseled about appropriate screening and preventive services including : fall prevention , diabetes screening, nutrition counseling, colorectal cancer screening, and recommended immunizations.    CC: The primary encounter diagnosis was Encounter for general adult medical examination with abnormal findings. Diagnoses of Venous stasis, Restless leg syndrome, Cervical cancer screening, Encounter for immunization, Prediabetes, Hypothyroidism (acquired), Mixed hyperlipidemia, Breast mass, right, and Elevated ALT measurement were also pertinent to this visit.  Follow up on multiple issues 1) Hypothyroidism:  She resumed Synthroid after Dec visit for TSH 23.    Lab Results  Component Value Date   TSH 3.82 11/18/2017   2) abnormal mammogram:  Bilateral abnormalities.  Referred to Carol [name]  3) Obesity:  Has had a weight gain of 4 lbs since last visit. Not exercising or following a low GI diet  4) prediabetes:   Discussed  Her recent A1c,  The role of exercise and low GI diet in postponing diagnosis  of insulin resistant type 2 DM .     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   She has a past surgical history that includes Appendectomy (06/1997); External ear surgery (09/2008); Colposcopy (10/16/2011); Colonoscopy (10/15/2012); Breast biopsy (Right, 2002); and Colonoscopy w/ biopsies (03/30/2004).   Her family history includes Arthritis in her unknown relative; Coronary artery disease in her unknown relative; Depression in her sister; Diabetes in her brother and brother; Heart disease in her brother and brother; Heart failure in her brother and father; Hyperlipidemia in her brother, father, and sister; Hypertension in her brother, father, sister, and sister; Stroke in her father.She reports that she quit smoking about 5 years ago. Her smoking use included cigarettes. She has a 40.00 pack-year smoking history. she has never used smokeless tobacco. She reports that she drinks alcohol. She reports that she does not use drugs.  Outpatient Medications Prior to Visit  Medication Sig Dispense Refill  . ascorbic acid (VITAMIN C) 500 MG tablet Take 500 mg by mouth daily.    Marland Kitchen aspirin 81 MG tablet Take 162 mg by mouth daily.     Marland Kitchen atorvastatin (LIPITOR) 20 MG tablet TAKE ONE (1) TABLET BY MOUTH EVERY DAY 90 tablet 1  . Cholecalciferol (VITAMIN D3) 2000 UNITS capsule Take 2,000 Units by mouth daily.      . fluticasone (FLONASE) 50 MCG/ACT nasal spray Place 2 sprays into both nostrils daily. 16 g 6  . Glucosamine Sulfate 1000 MG CAPS Take 1 capsule by mouth daily.      . Misc Natural Products (OSTEO BI-FLEX JOINT SHIELD) TABS Take 2 tablets by mouth daily. With magnesium     . Multiple Vitamin (MULTIVITAMIN) tablet Take 1 tablet by mouth daily.    . NON FORMULARY Swiss kirss herbalaxative     . furosemide (LASIX) 20 MG tablet Take 1 tablet (20 mg total) by mouth 2 (two) times daily. 200 tablet 1  . rOPINIRole (REQUIP) 0.5 MG tablet Take 1 tablet (0.5 mg total)  by mouth at bedtime. 90 tablet 1  . levothyroxine (SYNTHROID, LEVOTHROID) 50 MCG tablet Take 1 tablet (50 mcg total) by mouth daily. (Patient not taking: Reported on 11/20/2017) 90 tablet 1  . potassium chloride SA (K-DUR,KLOR-CON) 20 MEQ tablet Take 4.5 tablets (90 mEq total) by mouth daily. (Patient not taking: Reported on 11/20/2017) 30 tablet 0   No facility-administered medications prior to visit.     Review of Systems   Patient denies headache, fevers, malaise, unintentional weight loss, skin rash, eye pain, sinus congestion and sinus pain, sore throat, dysphagia,  hemoptysis , cough, dyspnea, wheezing, chest pain, palpitations, orthopnea, edema, abdominal pain, nausea, melena, diarrhea, constipation, flank pain, dysuria, hematuria, urinary  Frequency, nocturia, numbness, tingling, seizures,  Focal weakness, Loss of consciousness,  Tremor, insomnia, depression, anxiety, and suicidal ideation.      Objective:  BP 116/72 (BP Location: Left Arm, Patient Position: Sitting, Cuff Size: Large)   Pulse (!) 104   Temp 98.2 F (36.8 C) (Oral)   Resp 15   Ht 5' 1.75" (1.568 m)   Wt 217 lb 6.4 oz (98.6 kg)   SpO2 96%   BMI 40.09 kg/m   Physical Exam   General Appearance:    Alert,  cooperative, no distress, appears stated age  Head:    Normocephalic, without obvious abnormality, atraumatic  Eyes:    PERRL, conjunctiva/corneas clear, EOM's intact, fundi    benign, both eyes  Ears:    Normal TM's and external ear canals, both ears  Nose:   Nares normal, septum midline, mucosa normal, no drainage    or sinus tenderness  Throat:   Lips, mucosa, and tongue normal; teeth and gums normal  Neck:   Supple, symmetrical, trachea midline, no adenopathy;    thyroid:  no enlargement/tenderness/nodules; no carotid   bruit or JVD  Back:     Symmetric, no curvature, ROM normal, no CVA tenderness  Lungs:     Clear to auscultation bilaterally, respirations unlabored  Chest Wall:    No tenderness or  deformity   Heart:    Regular rate and rhythm, S1 and S2 normal, no murmur, rub   or gallop  Breast Exam:    Deferred to Carol [name]  Abdomen:     Soft, non-tender, bowel sounds active all four quadrants,    no masses, no organomegaly  Genitalia:    Pelvic: cervix difficult to assess due to length of vaginal and retroverted uterus .  Vagina was normal in appearance, external genitalia normal, no adnexal masses or tenderness, no cervical motion tenderness, rectovaginal septum normal, uterus normal size, shape, and consistency and vagina normal without discharge  Extremities:   Extremities normal, atraumatic, no cyanosis or edema  Pulses:   2+ and symmetric all extremities  Skin:   Skin color, texture, turgor normal, no rashes or lesions  Lymph nodes:   Cervical, supraclavicular, and axillary nodes normal  Neurologic:   CNII-XII intact, normal strength, sensation and reflexes    throughout      Assessment & Plan:   Problem List Items Addressed This Visit    Venous stasis   Relevant Medications   furosemide (LASIX) 20 MG tablet   Restless leg syndrome   Relevant Medications   rOPINIRole (REQUIP) 0.5 MG tablet   Prediabetes    Borderline diabetic, recommended weight loss,strict low carbohydrate diet, and exercise. Lab Results  Component Value Date   HGBA1C 6.3 10/02/2017          Hypothyroidism (acquired)    Thyroid function is WNL on resumed dose of 50 mcg daily .  No current changes needed.   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 LD is at goal,  ALT mildly elevated but given her metabolic syndrome , suspect fatty liver.  Needs repeat testing in 4 weeks   Lab Results  Component Value Date   CHOL 132 10/02/2017   HDL 48.70 10/02/2017    LDLCALC 51 10/02/2017   TRIG 162.0 (H) 10/02/2017   CHOLHDL 3 10/02/2017   Lab Results  Component Value Date   ALT 44 (H) 11/18/2017   AST 30 11/18/2017   ALKPHOS 84 11/18/2017   BILITOT 0.9 11/18/2017         Relevant Medications   furosemide (LASIX) 20 MG tablet   Encounter for general adult medical examination with abnormal findings - Primary    Annual comprehensive preventive exam was done as well as an evaluation and management of chronic conditions .  During the course of the visit the patient was educated and counseled about appropriate screening and preventive services including :  diabetes screening, lipid analysis with projected  10 year  risk for CAD , nutrition counseling, breast, cervical and colorectal cancer screening, and recommended immunizations.  Printed recommendations for health maintenance screenings was given  Needs referral for colonoscopy ([facility]) PAP smear done today Mammogram abnormal; surgical referral to [facility] for biopsy made       Breast mass, right    Referral to Carol [name] for exam and biopsy if indicated        Other Visit Diagnoses    Cervical cancer screening       Relevant Orders   Cytology - PAP (Completed)   Encounter for immunization       Relevant Orders   Flu vaccine HIGH DOSE PF (Completed)   Elevated ALT measurement       Relevant Orders   Hepatic function panel      I have discontinued Sesilia Vien's potassium chloride SA. I am also having her maintain her Glucosamine Sulfate, OSTEO BI-FLEX JOINT SHIELD, NON FORMULARY, Vitamin D3, ascorbic acid, multivitamin, fluticasone, aspirin, atorvastatin, furosemide, rOPINIRole, and levothyroxine.  Meds ordered this encounter  Medications  . furosemide (LASIX) 20 MG tablet    Sig: Take 1 tablet (20 mg total) by mouth 2 (two) times daily.    Dispense:  180 tablet    Refill:  1  . rOPINIRole (REQUIP) 0.5 MG tablet    Sig: Take 1 tablet (0.5 mg total) by mouth at bedtime.     Dispense:  90 tablet    Refill:  1  . levothyroxine (SYNTHROID, LEVOTHROID) 50 MCG tablet    Sig: Take 1 tablet (50 mcg total) by mouth daily.    Dispense:  90 tablet    Refill:  1    Medications Discontinued During This Encounter  Medication Reason  . potassium chloride SA (K-DUR,KLOR-CON) 20 MEQ tablet Patient has not taken in last 30 days  . furosemide (LASIX) 20 MG tablet Reorder  . rOPINIRole (REQUIP) 0.5 MG tablet Reorder  . levothyroxine (SYNTHROID, LEVOTHROID) 50 MCG tablet Reorder    Follow-up: Return in about 3 months (around 02/17/2018).   Crecencio Mc, MD

## 2017-11-20 NOTE — Patient Instructions (Addendum)
You received the flu vaccine today  Your  fasting glucose has never been  diagnostic of diabetes; but your A1c continues to suggest that  you are at risk for developing type 2 Diabetes.  lifestyle changes (regular exercise,  Low glycemic index diet) have been show to delay the progression to diabetes for a long time.     I would like to see you again  In 3 months   This is  Dr. Lupita Dawn  example of a  "Low GI"  Diet:  It will allow you to lose 4 to 8  lbs  per month if you follow it carefully.  Your goal with exercise is a minimum of 30 minutes of aerobic exercise 5 days per week (Walking does not count once it becomes easy!)    All of the foods can be found at grocery stores and in bulk at [employer].  The Atkins protein bars and shakes are available in more varieties at [employer], [employer] and [employer].     7 AM Breakfast:  Choose from the following:  Low carbohydrate Protein  Shakes (I recommend the  Premier Protein chocolate shakes,  EAS AdvantEdge "Carb Control" shakes  Or the Atkins shakes all are under 3 net carbs)     a scrambled egg/bacon/cheese burrito made with Mission's "carb balance" whole wheat tortilla  (about 10 net carbs )  Regulatory affairs officer (basically a quiche without the pastry crust) and an egg sandwhich  That are  very convenient way to get your eggs.  8 carbs) Ore Ida's "just Crack an Egg" is also a very good choice (look in the egg section)     Avoid cereal and bananas, oatmeal and cream of wheat and grits. They are loaded with carbohydrates!   10 AM: high protein snack:  Protein bar by Atkins (the snack size, under 200 cal, usually < 6 net carbs).    A stick of cheese:  Around 1 carb,  100 cal     Dannon Light n Fit [place] Yogurt  (80 cal, 8 carbs)  Other so called "protein bars" and Greek yogurts tend to be loaded with carbohydrates.  Remember, in food advertising, the word "energy" is synonymous for " carbohydrate."  Lunch:   A Sandwich using  the bread choices listed, Can use any  Eggs,  lunchmeat, grilled meat or canned tuna), avocado, regular mayo/mustard  and cheese.  A Salad using blue cheese, ranch,  Goddess or vinagrette,  Avoid taco shells, croutons or "confetti" and no "candied nuts" but regular nuts OK.   No pretzels, nabs  or chips.  Pickles and miniature sweet peppers are a good low carb alternative that provide a "crunch"  The bread is the only source of carbohydrate in a sandwich and  can be decreased by trying some of the attached alternatives to traditional loaf bread   Avoid "Low fat dressings, as well as Glade dressings They are loaded with sugar!   3 PM/ Mid day  Snack:  Consider  1 ounce of  almonds, walnuts, pistachios, pecans, peanuts,  Macadamia nuts or a nut medley.  Avoid "granola and granola bars "  Mixed nuts are ok in moderation as long as there are no raisins,  cranberries or dried fruit.   KIND bars are OK if you get the low glycemic index variety   Try the prosciutto/mozzarella cheese sticks by Fiorruci  In deli /backery section   High protein  6 PM  Dinner:     Meat/fowl/fish with a green salad, and either broccoli, cauliflower, green beans, spinach, brussel sprouts or  Lima beans. DO NOT BREAD THE PROTEIN!!      There is a low carb pasta by Dreamfield's that is acceptable and tastes great: only 5 digestible carbs/serving.( All grocery stores but BJs carry it ) Several ready made meals are available low carb:   Try Michel Angelo's chicken piccata or chicken or eggplant parm over low carb pasta.(Lowes and BJs)   Marjory Lies Sanchez's "Carnitas" (pulled pork, no sauce,  0 carbs) or his beef pot roast to make a dinner burrito (at [employer]'s)  Pesto over low carb pasta (bj's sells a good quality pesto in the center refrigerated section of the deli   Try satueeing  Cheral Marker with mushroooms as a good side   [employer] Giant makes a mashed cauliflower that tastes like mashed potatoes  Whole  wheat pasta is still full of digestible carbs and  Not as low in glycemic index as Dreamfield's.   Brown rice is still rice,  So skip the rice and noodles if you eat [place] or [place] (or at least limit to 1/2 cup)  9 PM snack :   Breyer's "low carb" fudgsicle or  ice cream bar (Carb Smart line), or  Weight Watcher's ice cream bar , or another "no sugar added" ice cream;  a serving of fresh berries/cherries with whipped cream   Cheese or DANNON'S LlGHT N FIT GREEK YOGURT  8 ounces of [place] unsweetened almond/cococunut milk    Treat yourself to a parfait made with whipped cream blueberiies, walnuts and vanilla greek yogurt  Avoid bananas, pineapple, grapes  and watermelon on a regular basis because they are high in sugar.  THINK OF THEM AS DESSERT  Remember that snack Substitutions should be less than 10 NET carbs per serving and meals < 20 carbs. Remember to subtract fiber grams to get the "net carbs."

## 2017-11-21 ENCOUNTER — Ambulatory Visit: Payer: Medicare Other | Admitting: General Surgery

## 2017-11-21 LAB — CYTOLOGY - PAP
Diagnosis: NEGATIVE
HPV: NOT DETECTED

## 2017-11-22 ENCOUNTER — Encounter: Payer: Self-pay | Admitting: General Surgery

## 2017-11-23 ENCOUNTER — Encounter: Payer: Self-pay | Admitting: Internal Medicine

## 2017-11-23 DIAGNOSIS — N6311 Unspecified lump in the right breast, upper outer quadrant: Secondary | ICD-10-CM | POA: Insufficient documentation

## 2017-11-23 MED ORDER — LEVOTHYROXINE SODIUM 50 MCG PO TABS: 50.0000 ug | ORAL_TABLET | Freq: Every day | ORAL | 1 refills | Status: DC

## 2017-11-23 NOTE — Assessment & Plan Note (Addendum)
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 LD is at goal,  ALT mildly elevated but given her metabolic syndrome , suspect fatty liver.  Needs repeat testing in 4 weeks   Lab Results  Component Value Date   CHOL 132 10/02/2017   HDL 48.70 10/02/2017   LDLCALC 51 10/02/2017   TRIG 162.0 (H) 10/02/2017   CHOLHDL 3 10/02/2017   Lab Results  Component Value Date   ALT 44 (H) 11/18/2017   AST 30 11/18/2017   ALKPHOS 84 11/18/2017   BILITOT 0.9 11/18/2017

## 2017-11-23 NOTE — Assessment & Plan Note (Addendum)
Thyroid function is WNL on resumed dose of 50 mcg daily .  No current changes needed.   Lab Results  Component Value Date   TSH 3.82 11/18/2017

## 2017-11-23 NOTE — Assessment & Plan Note (Signed)
Referral to DR Bary Castilla for exam and biopsy if indicated

## 2017-11-23 NOTE — Assessment & Plan Note (Signed)
Annual comprehensive preventive exam was done as well as an evaluation and management of chronic conditions .  During the course of the visit the patient was educated and counseled about appropriate screening and preventive services including :  diabetes screening, lipid analysis with projected  10 year  risk for CAD , nutrition counseling, breast, cervical and colorectal cancer screening, and recommended immunizations.  Printed recommendations for health maintenance screenings was given  Needs referral for colonoscopy ([facility]) PAP smear done today Mammogram abnormal; surgical referral to [facility] for biopsy made

## 2017-11-23 NOTE — Assessment & Plan Note (Signed)
Borderline diabetic, recommended weight loss,strict low carbohydrate diet, and exercise. Lab Results  Component Value Date   HGBA1C 6.3 10/02/2017

## 2017-11-25 ENCOUNTER — Encounter: Payer: Self-pay | Admitting: General Surgery

## 2017-11-27 ENCOUNTER — Other Ambulatory Visit: Payer: Self-pay | Admitting: Family Medicine

## 2017-11-27 DIAGNOSIS — G2581 Restless legs syndrome: Secondary | ICD-10-CM

## 2017-12-02 ENCOUNTER — Encounter: Payer: Self-pay | Admitting: General Surgery

## 2017-12-02 ENCOUNTER — Ambulatory Visit (INDEPENDENT_AMBULATORY_CARE_PROVIDER_SITE_OTHER): Payer: Medicare Other | Admitting: General Surgery

## 2017-12-02 ENCOUNTER — Inpatient Hospital Stay: Payer: Self-pay

## 2017-12-02 VITALS — BP 140/78 | HR 98 | Resp 14 | Ht 63.0 in | Wt 217.0 lb

## 2017-12-02 DIAGNOSIS — N6311 Unspecified lump in the right breast, upper outer quadrant: Secondary | ICD-10-CM | POA: Diagnosis not present

## 2017-12-02 HISTORY — PX: BREAST BIOPSY: SHX20

## 2017-12-02 NOTE — Patient Instructions (Signed)

## 2017-12-02 NOTE — Progress Notes (Signed)
Patient ID: Carol [name], female   DOB: August 27, 1946, 71 y.o.   MRN: [id number]  Chief Complaint  Patient presents with  . Other    HPI Carol [name] is a 72 y.o. female who presents for a breast evaluation. The most recent mammogram was done on 10/14/2017 and added views on 11/01/2017.  Marland Kitchen  Patient does perform regular self breast checks and gets regular mammograms done.       HPI  Past Medical History:  Diagnosis Date  . Allergic rhinitis, cause unspecified   . Cataract   . Chicken pox   . Fibromyalgia   . HLD (hyperlipidemia)   . 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 Other abnormal glucose   . 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 Personal history of colonic polyps   . Shingles   . Swelling of limb   . Tobacco use disorder   . Unspecified sinusitis (chronic)     Past Surgical History:  Procedure Laterality Date  . APPENDECTOMY  06/1997  . BREAST BIOPSY Right 2002    core with clip  . BREAST BIOPSY Right 2011  . COLONOSCOPY  10/15/2012   mutliple polyps.  Outlaw in [place].  Repeat 5 years.  . COLONOSCOPY W/ BIOPSIES  03/30/2004   Adenomatous polyp of the cecum, hyperplastic polyps of the splenic flexure, upper rectum and mid rectum.  . COLPOSCOPY  10/16/2011   HGSIL pap smear.  Negative.  Marland Kitchen EXTERNAL EAR SURGERY  09/2008   external - not specified  (RIGHT)    Family History  Problem Relation Age of Onset  . Heart failure Father   . Hypertension Father   . Stroke Father   . Hyperlipidemia Father   . Heart failure Brother   . Diabetes Brother   . Heart disease Brother   . Diabetes Brother   . Heart disease Brother   . Hypertension Sister   . Depression Sister   . Hypertension Sister   . Hyperlipidemia Sister   . Arthritis Unknown   . Coronary artery disease Unknown   . Hyperlipidemia Brother   . Hypertension Brother   . Breast cancer Neg Hx     Social History Social History   Tobacco Use  . Smoking status:  Former Smoker    Packs/day: 1.00    Years: 40.00    Pack years: 40.00    Types: Cigarettes    Last attempt to quit: 04/01/2012    Years since quitting: 5.6  . Smokeless tobacco: Never Used  . Tobacco comment: 7-10 cigarettes for past 40 years   Substance Use Topics  . Alcohol use: Yes    Comment: occasional  5 drinks per year  . Drug use: No    No Known Allergies  Current Outpatient Medications  Medication Sig Dispense Refill  . aspirin 81 MG tablet Take 162 mg by mouth daily.     Marland Kitchen atorvastatin (LIPITOR) 20 MG tablet TAKE ONE (1) TABLET BY MOUTH EVERY DAY 90 tablet 1  . Cholecalciferol (VITAMIN D3) 2000 UNITS capsule Take 2,000 Units by mouth daily.      . fluticasone (FLONASE) 50 MCG/ACT nasal spray Place 2 sprays into both nostrils daily. 16 g 6  . furosemide (LASIX) 20 MG tablet Take 1 tablet (20 mg total) by mouth 2 (two) times daily. 180 tablet 1  . Glucosamine Sulfate 1000 MG CAPS Take 1 capsule by mouth daily.      Marland Kitchen levothyroxine (SYNTHROID, LEVOTHROID) 50 MCG  tablet Take 1 tablet (50 mcg total) by mouth daily. 90 tablet 1  . Misc Natural Products (OSTEO BI-FLEX JOINT SHIELD) TABS Take 2 tablets by mouth daily. With magnesium     . Multiple Vitamin (MULTIVITAMIN) tablet Take 1 tablet by mouth daily.    . NON FORMULARY Swiss kirss herbalaxative     . rOPINIRole (REQUIP) 0.5 MG tablet TAKE 1 TABLET AT BEDTIME 90 tablet 1  . ascorbic acid (VITAMIN C) 500 MG tablet Take 500 mg by mouth daily.     No current facility-administered medications for this visit.     Review of Systems Review of Systems  Constitutional: Negative.   Respiratory: Negative.   Cardiovascular: Negative.     Blood pressure 140/78, pulse 98, resp. rate 14, height 5\' 3"  (1.6 m), weight 217 lb (98.4 kg).  Physical Exam Physical Exam  Constitutional: She is oriented to person, place, and time. She appears well-developed and well-nourished.  Eyes: Conjunctivae are normal. No scleral icterus.  Neck:  Neck supple.  Cardiovascular: Normal rate, regular rhythm and normal heart sounds.  Pulmonary/Chest: Effort normal and breath sounds normal. Right breast exhibits no inverted nipple, no mass, no nipple discharge, no skin change and no tenderness. Left breast exhibits no inverted nipple, no mass, no nipple discharge, no skin change and no tenderness.  Lymphadenopathy:    She has no cervical adenopathy.    She has no axillary adenopathy.  Neurological: She is alert and oriented to person, place, and time.  Skin: Skin is warm and dry.    Data Reviewed Screening mammograms of February 01, 2016 and October 11, 2017 were reviewed.  Diagnostic mammograms of November 01, 2016 and bilateral breast ultrasounds of the same date were reviewed.  Ultrasound showed an 8 mm mass corresponding to the mammographic finding.  Negative ultrasound of the axilla.  Left breast showed a intramammary lymph node.  The right breast was categorized BI-RADS-5.  The patient was interested in proceeding to biopsy.  Ultrasound examination of the right breast in the 9 o'clock position, 6 cm from the nipple showed an irregular hypoechoic mass with focal posterior acoustic shadowing measuring 0.61 x 0.74 x 0.72 cm.  10 cc of 0.5% Xylocaine with 0.25% Marcaine with 1-200,000 units of epinephrine was utilized and well-tolerated.  A 10-gauge Encor vacuum biopsy device sent to an 11 mm biopsy diameter was passed under ultrasound guidance into the area.  12 core samples were obtained with near complete removal of the lesion.  Scant bleeding was noted.  A postbiopsy clip was placed.  Skin defect closed with benzoin and Steri-Strip followed by Telfa and Tegaderm dressing.  Ice pack provided.  Post biopsy wound care reviewed.  Assessment    New mammographic abnormality of the right breast.    Plan The patient reported that what ever was in the breast needed to come out.  I emphasized to her the importance of determining what if  anything were to be dealing with, as the treatment for her cancer would be different from a more benign condition.  The patient will be contacted when pathology results are available.  If a benign pathology result was obtained arrangements will be made for postbiopsy mammography to confirm correspondence between the mammographic and ultrasound lesions.   The patient was under the impression that abnormalities had been identified in the axillary lymph nodes.  This concern was put to rest.  A copy of the radiology report with the area is related to lymph glands were  highlighted so that she could review this at her leisure.   HPI, Physical Exam, Assessment and Plan have been scribed under the direction and in the presence of Hervey Ard, MD.  Gaspar Cola, CMA  I have completed the exam and reviewed the above documentation for accuracy and completeness.  I agree with the above.  Haematologist has been used and any errors in dictation or transcription are unintentional.  Hervey Ard, M.D., F.A.C.S.  Forest Gleason Byrnett 12/02/2017, 9:23 PM

## 2017-12-03 ENCOUNTER — Telehealth: Payer: Self-pay | Admitting: General Surgery

## 2017-12-03 NOTE — Telephone Encounter (Signed)
The patient was notified that yesterday's breast biopsy showed cancer.  Reports tolerating the procedure well. Arrangements in progress to review options.

## 2017-12-04 ENCOUNTER — Ambulatory Visit (INDEPENDENT_AMBULATORY_CARE_PROVIDER_SITE_OTHER): Payer: Medicare Other | Admitting: General Surgery

## 2017-12-04 ENCOUNTER — Encounter: Payer: Self-pay | Admitting: General Surgery

## 2017-12-04 VITALS — BP 156/78 | HR 80 | Resp 14 | Ht 63.0 in | Wt 216.0 lb

## 2017-12-04 DIAGNOSIS — Z853 Personal history of malignant neoplasm of breast: Secondary | ICD-10-CM | POA: Insufficient documentation

## 2017-12-04 DIAGNOSIS — C50411 Malignant neoplasm of upper-outer quadrant of right female breast: Secondary | ICD-10-CM | POA: Insufficient documentation

## 2017-12-04 NOTE — Progress Notes (Signed)
Patient ID: Carol [name], female   DOB: [birth year], 72 y.o.   MRN: [id number]  Chief Complaint  Patient presents with  . Follow-up    HPI Carol [name] is a 72 y.o. female.  Here for right breast biopsy discussion. She is here with Carol [name] her husband.  HPI  Past Medical History:  Diagnosis Date  . Allergic rhinitis, cause unspecified   . Cataract   . Chicken pox   . Fibromyalgia   . HLD (hyperlipidemia)   . 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 Other abnormal glucose   . 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 Personal history of colonic polyps   . Shingles   . Swelling of limb   . Tobacco use disorder   . Unspecified sinusitis (chronic)     Past Surgical History:  Procedure Laterality Date  . APPENDECTOMY  06/1997  . BREAST BIOPSY Right 2002    core with clip  . BREAST BIOPSY Right 2011  . COLONOSCOPY  10/15/2012   mutliple polyps.  Outlaw in [place].  Repeat 5 years.  . COLONOSCOPY W/ BIOPSIES  03/30/2004   Adenomatous polyp of the cecum, hyperplastic polyps of the splenic flexure, upper rectum and mid rectum.  . COLPOSCOPY  10/16/2011   HGSIL pap smear.  Negative.  Marland Kitchen EXTERNAL EAR SURGERY  09/2008   external - not specified  (RIGHT)    Family History  Problem Relation Age of Onset  . Heart failure Father   . Hypertension Father   . Stroke Father   . Hyperlipidemia Father   . Heart failure Brother   . Diabetes Brother   . Heart disease Brother   . Diabetes Brother   . Heart disease Brother   . Hypertension Sister   . Depression Sister   . Hypertension Sister   . Hyperlipidemia Sister   . Arthritis Unknown   . Coronary artery disease Unknown   . Hyperlipidemia Brother   . Hypertension Brother   . Breast cancer Neg Hx     Social History Social History   Tobacco Use  . Smoking status: Former Smoker    Packs/day: 1.00    Years: 40.00    Pack years: 40.00    Types: Cigarettes    Last attempt to quit:  04/01/2012    Years since quitting: 5.6  . Smokeless tobacco: Never Used  . Tobacco comment: 7-10 cigarettes for past 40 years   Substance Use Topics  . Alcohol use: Yes    Comment: occasional  5 drinks per year  . Drug use: No    No Known Allergies  Current Outpatient Medications  Medication Sig Dispense Refill  . ascorbic acid (VITAMIN C) 500 MG tablet Take 500 mg by mouth daily.    Marland Kitchen aspirin 81 MG tablet Take 162 mg by mouth daily.     Marland Kitchen atorvastatin (LIPITOR) 20 MG tablet TAKE ONE (1) TABLET BY MOUTH EVERY DAY 90 tablet 1  . Cholecalciferol (VITAMIN D3) 2000 UNITS capsule Take 2,000 Units by mouth daily.      . fluticasone (FLONASE) 50 MCG/ACT nasal spray Place 2 sprays into both nostrils daily. 16 g 6  . furosemide (LASIX) 20 MG tablet Take 1 tablet (20 mg total) by mouth 2 (two) times daily. 180 tablet 1  . Glucosamine Sulfate 1000 MG CAPS Take 1 capsule by mouth daily.      Marland Kitchen levothyroxine (SYNTHROID, LEVOTHROID) 50 MCG tablet Take 1 tablet (50 mcg total) by  mouth daily. 90 tablet 1  . Misc Natural Products (OSTEO BI-FLEX JOINT SHIELD) TABS Take 2 tablets by mouth daily. With magnesium     . Multiple Vitamin (MULTIVITAMIN) tablet Take 1 tablet by mouth daily.    . NON FORMULARY Swiss kirss herbalaxative     . rOPINIRole (REQUIP) 0.5 MG tablet TAKE 1 TABLET AT BEDTIME 90 tablet 1   No current facility-administered medications for this visit.     Review of Systems Review of Systems  Constitutional: Negative.   Respiratory: Negative.   Cardiovascular: Negative.     Blood pressure (!) 156/78, pulse 80, resp. rate 14, height 5\' 3"  (1.6 m), weight 216 lb (98 kg).  Physical Exam Physical Exam  Constitutional: She is oriented to person, place, and time. She appears well-developed and well-nourished.  Pulmonary/Chest:  Right breast biopsy site with small bruising. Small irritated area from Tegaderm dressing as well.  Neurological: She is alert and oriented to person, place,  and time.  Skin: Skin is warm and dry.  Psychiatric: Her behavior is normal.    Data Reviewed Breast, right, needle core biopsy, 9 o'clock - INVASIVE DUCTAL CARCINOMA. - PERINEURAL INVASION IS IDENTIFIED.  Assessment    Stage I carcinoma of the right breast.    Plan    The majority of the visit was spent reviewing the options for breast cancer treatment. Breast conservation with lumpectomy and radiation therapy  was presented as equivalent to mastectomy for long-term control. The pros and cons of each treatment regimen were reviewed. The indications for additional therapy such as chemotherapy were touched on briefly, realizing that the majority of information required to determine if chemotherapy would be of benefit is not available at this time. The availability of consultation services for medical oncology and radiation oncology prior to surgery were reviewed.   The patient at this time is reluctant to consider radiation therapy.  Risk of local recurrence without radiation therapy was reviewed.  Opportunities for plastic surgery consultation discussed.  Informational brochure and website access information provided.  Patient was encouraged to call with any questions or if she desired referral for surgical second opinion plastic surgery consultation.  HPI, Physical Exam, Assessment and Plan have been scribed under the direction and in the presence of Carol Bellow, MD. Carol Fetch, RN  I have completed the exam and reviewed the above documentation for accuracy and completeness.  I agree with the above.  Carol [name] has been used and any errors in dictation or transcription are unintentional.  Carol [name], M.D., F.A.C.S.  Carol [name] Carol [name] 12/04/2017, 7:46 PM

## 2017-12-04 NOTE — Patient Instructions (Signed)
The patient is aware to call back for any questions or concerns.  

## 2017-12-09 ENCOUNTER — Telehealth: Payer: Self-pay | Admitting: *Deleted

## 2017-12-09 ENCOUNTER — Encounter: Payer: Self-pay | Admitting: General Surgery

## 2017-12-09 NOTE — Progress Notes (Signed)
Right breast stereotactic biopsy dated November 05, 2000 describes a procedure completed for microcalcifications within the right breast.  Pathology report showed fibrocystic changes, microcalcifications.  Negative for malignancy.   Since her office visit the patient has contacted [place] to report she desires to proceed with breast conservation therapy.  Arrangements will be made for wide excision and sentinel node biopsy in the near future.

## 2017-12-09 NOTE — Telephone Encounter (Signed)
Patient called the office today stating that she wants to proceed with right breast lumpectomy.   Will request Dr. Bary Castilla to complete surgery sheet.  The patient would appreciate a call back on her home number to discuss a surgery date.

## 2017-12-10 ENCOUNTER — Telehealth: Payer: Self-pay

## 2017-12-10 ENCOUNTER — Other Ambulatory Visit: Payer: Self-pay

## 2017-12-10 ENCOUNTER — Other Ambulatory Visit: Payer: Self-pay | Admitting: General Surgery

## 2017-12-10 DIAGNOSIS — C50411 Malignant neoplasm of upper-outer quadrant of right female breast: Secondary | ICD-10-CM

## 2017-12-10 DIAGNOSIS — C50011 Malignant neoplasm of nipple and areola, right female breast: Secondary | ICD-10-CM

## 2017-12-10 DIAGNOSIS — Z170 Estrogen receptor positive status [ER+]: Secondary | ICD-10-CM

## 2017-12-10 NOTE — Telephone Encounter (Signed)
Spoke with the patient about a surgery date. The patient is scheduled for surgery at [facility] on 12/23/17. She will pre admit by phone. We will call her with an arrival time for the day of surgery. A surgery instructions sheet has been mailed to the patient. She does not need to come in for a pre op visit. The patient is aware of date and instructions.

## 2017-12-10 NOTE — Telephone Encounter (Signed)
Call to notify the patient to arrive at [facility] and report to the Radiology desk on 12/23/17 at 7:45 am.

## 2017-12-16 ENCOUNTER — Other Ambulatory Visit: Payer: Self-pay

## 2017-12-16 ENCOUNTER — Encounter
Admission: RE | Admit: 2017-12-16 | Discharge: 2017-12-16 | Disposition: A | Discharge status: Home / Self Care | Payer: Medicare Other | Source: Ambulatory Visit | Attending: General Surgery | Admitting: General Surgery

## 2017-12-16 HISTORY — DX: Nontoxic goiter, unspecified: E04.9

## 2017-12-16 HISTORY — DX: Unspecified osteoarthritis, unspecified site: M19.90

## 2017-12-16 HISTORY — DX: Prediabetes: R73.03

## 2017-12-16 HISTORY — DX: Bronchitis, not specified as acute or chronic: J40

## 2017-12-16 HISTORY — DX: Hypothyroidism, unspecified: E03.9

## 2017-12-16 NOTE — Patient Instructions (Signed)
Your procedure is scheduled on: 12-23-17 MONDAY Report to [facility] (2ND DESK ON RIGHT) @ 7:45 AM  Remember: Instructions that are not followed completely may result in serious medical risk, up to and including death, or upon the discretion of your surgeon and anesthesiologist your surgery may need to be rescheduled.    _x___ 1. Do not eat food after midnight the night before your procedure. NO GUM OR CANDY AFTER MIDNIGHT.  You may drink clear liquids up to 2 hours before you are scheduled to arrive at the hospital for your procedure.  Do not drink clear liquids within 2 hours of your scheduled arrival to the hospital.  Clear liquids include  --Water or Apple juice without pulp  --Clear carbohydrate beverage such as ClearFast or Gatorade  --Black Coffee or Clear Tea (No milk, no creamers, do not add anything to the coffee or Tea    __x__ 2. No Alcohol for 24 hours before or after surgery.   __x__3. No Smoking or e-cigarettes for 24 prior to surgery.  Do not use any chewable tobacco products for at least 6 hour prior to surgery   ____  4. Bring all medications with you on the day of surgery if instructed.    __x__ 5. Notify your doctor if there is any change in your medical condition     (cold, fever, infections).    x___6. On the morning of surgery brush your teeth with toothpaste and water.  You may rinse your mouth with mouth wash if you wish.  Do not swallow any toothpaste or mouthwash.   Do not wear jewelry, make-up, hairpins, clips or nail polish.  Do not wear lotions, powders, or perfumes. You may wear deodorant.  Do not shave 48 hours prior to surgery. Men may shave face and neck.  Do not bring valuables to the hospital.    [facility] is not responsible for any belongings or valuables.               Contacts, dentures or bridgework may not be worn into surgery.  Leave your suitcase in the car. After surgery it may be brought to your room.  For patients  admitted to the hospital, discharge time is determined by your treatment team.  _  Patients discharged the day of surgery will not be allowed to drive home.  You will need someone to drive you home and stay with you the night of your procedure.    Please read over the following fact sheets that you were given:   [facility] Preparing for Surgery and or MRSA Information   _x___ TAKE THE FOLLOWING MEDICATION THE MORNING OF SURGERY WITH A SMALL SIP OF WATER. These include:  1. LEVOTHYROXINE  2. ZYRTEC  3.  4.  5.  6.  ____Fleets enema or Magnesium Citrate as directed.   _x___ Use CHG Soap or sage wipes as directed on instruction sheet   ____ Use inhalers on the day of surgery and bring to hospital day of surgery  ____ Stop Metformin and Janumet 2 days prior to surgery.    ____ Take 1/2 of usual insulin dose the night before surgery and none on the morning surgery.   _x___ Follow recommendations from Cardiologist, Pulmonologist or PCP regarding stopping Aspirin, Coumadin, Plavix ,Eliquis, Effient, or Pradaxa, and Pletal-OK TO CONTINUE 81 MG ASPIRIN-DO NOT TAKE DAY OF SURGERY  ____Stop Anti-inflammatories such as Advil, Aleve, Ibuprofen, Motrin, Naproxen, Naprosyn, Goodies powders or aspirin products. OK to  take Tylenol    _x___ Stop supplements until after surgery-STOP VITAMIN C, COQ-10, GLUCOSAMINE, AND OSTEO BI-FLEX NOW-YOU MAY RESUME AFTER SURGERY   ____ Bring C-Pap to the hospital.

## 2017-12-17 ENCOUNTER — Inpatient Hospital Stay: Admission: RE | Admit: 2017-12-17 | Payer: Medicare Other | Source: Ambulatory Visit

## 2017-12-18 ENCOUNTER — Encounter
Admission: RE | Admit: 2017-12-18 | Discharge: 2017-12-18 | Disposition: A | Discharge status: Home / Self Care | Payer: Medicare Other | Source: Ambulatory Visit | Attending: General Surgery | Admitting: General Surgery

## 2017-12-18 ENCOUNTER — Other Ambulatory Visit: Payer: Self-pay

## 2017-12-18 DIAGNOSIS — E7800 Pure hypercholesterolemia, unspecified: Secondary | ICD-10-CM | POA: Insufficient documentation

## 2017-12-18 DIAGNOSIS — Z79890 Hormone replacement therapy (postmenopausal): Secondary | ICD-10-CM | POA: Diagnosis not present

## 2017-12-18 DIAGNOSIS — E039 Hypothyroidism, unspecified: Secondary | ICD-10-CM | POA: Diagnosis not present

## 2017-12-18 DIAGNOSIS — R7303 Prediabetes: Secondary | ICD-10-CM | POA: Insufficient documentation

## 2017-12-18 DIAGNOSIS — Z7951 Long term (current) use of inhaled steroids: Secondary | ICD-10-CM | POA: Diagnosis not present

## 2017-12-18 DIAGNOSIS — Z01812 Encounter for preprocedural laboratory examination: Secondary | ICD-10-CM | POA: Diagnosis present

## 2017-12-18 DIAGNOSIS — Z01810 Encounter for preprocedural cardiovascular examination: Secondary | ICD-10-CM | POA: Diagnosis present

## 2017-12-18 DIAGNOSIS — Z87891 Personal history of nicotine dependence: Secondary | ICD-10-CM | POA: Diagnosis not present

## 2017-12-18 DIAGNOSIS — C50511 Malignant neoplasm of lower-outer quadrant of right female breast: Secondary | ICD-10-CM | POA: Diagnosis not present

## 2017-12-18 DIAGNOSIS — Z7982 Long term (current) use of aspirin: Secondary | ICD-10-CM | POA: Diagnosis not present

## 2017-12-18 DIAGNOSIS — E785 Hyperlipidemia, unspecified: Secondary | ICD-10-CM | POA: Diagnosis not present

## 2017-12-18 DIAGNOSIS — J309 Allergic rhinitis, unspecified: Secondary | ICD-10-CM | POA: Diagnosis not present

## 2017-12-18 DIAGNOSIS — Z79899 Other long term (current) drug therapy: Secondary | ICD-10-CM | POA: Diagnosis not present

## 2017-12-18 LAB — POTASSIUM: Potassium: 3.9 mmol/L (ref 3.5–5.1)

## 2017-12-23 ENCOUNTER — Ambulatory Visit
Admission: RE | Admit: 2017-12-23 | Discharge: 2017-12-23 | Disposition: A | Discharge status: Home / Self Care | Payer: Medicare Other | Source: Ambulatory Visit | Attending: General Surgery | Admitting: General Surgery

## 2017-12-23 ENCOUNTER — Encounter
Admission: RE | Disposition: A | Discharge status: Home / Self Care | Payer: Self-pay | Source: Ambulatory Visit | Attending: General Surgery

## 2017-12-23 ENCOUNTER — Encounter: Payer: Self-pay | Admitting: *Deleted

## 2017-12-23 ENCOUNTER — Ambulatory Visit: Payer: Medicare Other | Admitting: Registered Nurse

## 2017-12-23 ENCOUNTER — Other Ambulatory Visit: Payer: Self-pay

## 2017-12-23 DIAGNOSIS — C50411 Malignant neoplasm of upper-outer quadrant of right female breast: Secondary | ICD-10-CM

## 2017-12-23 DIAGNOSIS — C50911 Malignant neoplasm of unspecified site of right female breast: Secondary | ICD-10-CM

## 2017-12-23 DIAGNOSIS — E785 Hyperlipidemia, unspecified: Secondary | ICD-10-CM | POA: Insufficient documentation

## 2017-12-23 DIAGNOSIS — Z87891 Personal history of nicotine dependence: Secondary | ICD-10-CM | POA: Insufficient documentation

## 2017-12-23 DIAGNOSIS — C50011 Malignant neoplasm of nipple and areola, right female breast: Secondary | ICD-10-CM

## 2017-12-23 DIAGNOSIS — J309 Allergic rhinitis, unspecified: Secondary | ICD-10-CM | POA: Insufficient documentation

## 2017-12-23 DIAGNOSIS — Z170 Estrogen receptor positive status [ER+]: Secondary | ICD-10-CM

## 2017-12-23 DIAGNOSIS — Z7951 Long term (current) use of inhaled steroids: Secondary | ICD-10-CM | POA: Insufficient documentation

## 2017-12-23 DIAGNOSIS — C50511 Malignant neoplasm of lower-outer quadrant of right female breast: Secondary | ICD-10-CM

## 2017-12-23 DIAGNOSIS — E039 Hypothyroidism, unspecified: Secondary | ICD-10-CM | POA: Insufficient documentation

## 2017-12-23 DIAGNOSIS — Z79899 Other long term (current) drug therapy: Secondary | ICD-10-CM | POA: Insufficient documentation

## 2017-12-23 DIAGNOSIS — Z7982 Long term (current) use of aspirin: Secondary | ICD-10-CM | POA: Insufficient documentation

## 2017-12-23 DIAGNOSIS — Z79890 Hormone replacement therapy (postmenopausal): Secondary | ICD-10-CM | POA: Insufficient documentation

## 2017-12-23 HISTORY — PX: MASTECTOMY, PARTIAL: SHX709

## 2017-12-23 HISTORY — PX: SENTINEL NODE BIOPSY: SHX6608

## 2017-12-23 LAB — GLUCOSE, CAPILLARY
Glucose-Capillary: 131 mg/dL — ABNORMAL HIGH (ref 65–99)
Glucose-Capillary: 136 mg/dL — ABNORMAL HIGH (ref 65–99)

## 2017-12-23 SURGERY — MASTECTOMY PARTIAL
Anesthesia: General | Laterality: Right

## 2017-12-23 MED ORDER — GABAPENTIN 300 MG PO CAPS
ORAL_CAPSULE | ORAL | Status: AC
  Administered 2017-12-23: 300 mg via ORAL
  Filled 2017-12-23: qty 1

## 2017-12-23 MED ORDER — DEXAMETHASONE SODIUM PHOSPHATE 10 MG/ML IJ SOLN
INTRAMUSCULAR | Status: AC
Start: 2017-12-23 — End: ?
  Filled 2017-12-23: qty 1

## 2017-12-23 MED ORDER — LIDOCAINE HCL (CARDIAC) 20 MG/ML IV SOLN
INTRAVENOUS | Status: DC | PRN
  Administered 2017-12-23: 60 mg via INTRAVENOUS

## 2017-12-23 MED ORDER — FENTANYL CITRATE (PF) 100 MCG/2ML IJ SOLN
INTRAMUSCULAR | Status: DC | PRN
  Administered 2017-12-23 (×3): 25 ug via INTRAVENOUS

## 2017-12-23 MED ORDER — TECHNETIUM TC 99M SULFUR COLLOID FILTERED
0.7840 | Freq: Once | INTRAVENOUS | Status: AC | PRN
  Administered 2017-12-23: 0.784 via INTRADERMAL

## 2017-12-23 MED ORDER — LIDOCAINE HCL (PF) 2 % IJ SOLN
INTRAMUSCULAR | Status: AC
Start: 2017-12-23 — End: ?
  Filled 2017-12-23: qty 10

## 2017-12-23 MED ORDER — CELECOXIB 200 MG PO CAPS
ORAL_CAPSULE | ORAL | Status: AC
  Administered 2017-12-23: 200 mg via ORAL
  Filled 2017-12-23: qty 1

## 2017-12-23 MED ORDER — PROPOFOL 10 MG/ML IV BOLUS
INTRAVENOUS | Status: AC
  Filled 2017-12-23: qty 20

## 2017-12-23 MED ORDER — FAMOTIDINE 20 MG PO TABS
ORAL_TABLET | ORAL | Status: AC
  Administered 2017-12-23: 20 mg via ORAL
  Filled 2017-12-23: qty 1

## 2017-12-23 MED ORDER — BUPIVACAINE-EPINEPHRINE (PF) 0.5% -1:200000 IJ SOLN
INTRAMUSCULAR | Status: DC | PRN
  Administered 2017-12-23: 20 mL
  Administered 2017-12-23: 10 mL

## 2017-12-23 MED ORDER — ACETAMINOPHEN 10 MG/ML IV SOLN
INTRAVENOUS | Status: DC | PRN
  Administered 2017-12-23: 1000 mg via INTRAVENOUS

## 2017-12-23 MED ORDER — EPHEDRINE SULFATE 50 MG/ML IJ SOLN
INTRAMUSCULAR | Status: AC
  Filled 2017-12-23: qty 1

## 2017-12-23 MED ORDER — FENTANYL CITRATE (PF) 100 MCG/2ML IJ SOLN
INTRAMUSCULAR | Status: AC
  Administered 2017-12-23: 25 ug via INTRAVENOUS
  Filled 2017-12-23: qty 2

## 2017-12-23 MED ORDER — OXYCODONE HCL 5 MG PO TABS
ORAL_TABLET | ORAL | Status: AC
  Administered 2017-12-23: 5 mg via ORAL
  Filled 2017-12-23: qty 1

## 2017-12-23 MED ORDER — GABAPENTIN 300 MG PO CAPS
300.0000 mg | ORAL_CAPSULE | ORAL | Status: AC
  Administered 2017-12-23: 300 mg via ORAL

## 2017-12-23 MED ORDER — REMIFENTANIL HCL 1 MG IV SOLR
INTRAVENOUS | Status: AC
  Filled 2017-12-23: qty 1000

## 2017-12-23 MED ORDER — CELECOXIB 200 MG PO CAPS
200.0000 mg | ORAL_CAPSULE | ORAL | Status: AC
  Administered 2017-12-23: 200 mg via ORAL

## 2017-12-23 MED ORDER — ONDANSETRON HCL 4 MG/2ML IJ SOLN
INTRAMUSCULAR | Status: AC
  Filled 2017-12-23: qty 2

## 2017-12-23 MED ORDER — EPHEDRINE SULFATE 50 MG/ML IJ SOLN
INTRAMUSCULAR | Status: AC
Start: 2017-12-23 — End: ?
  Filled 2017-12-23: qty 1

## 2017-12-23 MED ORDER — METHYLENE BLUE 1 % INJ SOLN
INTRAMUSCULAR | Status: AC
  Filled 2017-12-23: qty 10

## 2017-12-23 MED ORDER — ONDANSETRON HCL 4 MG/2ML IJ SOLN
INTRAMUSCULAR | Status: DC | PRN
  Administered 2017-12-23: 4 mg via INTRAVENOUS

## 2017-12-23 MED ORDER — LACTATED RINGERS IV SOLN
INTRAVENOUS | Status: DC
  Administered 2017-12-23 (×2): via INTRAVENOUS

## 2017-12-23 MED ORDER — DEXAMETHASONE SODIUM PHOSPHATE 10 MG/ML IJ SOLN
INTRAMUSCULAR | Status: DC | PRN
  Administered 2017-12-23: 5 mg via INTRAVENOUS

## 2017-12-23 MED ORDER — ACETAMINOPHEN 10 MG/ML IV SOLN
INTRAVENOUS | Status: AC
  Filled 2017-12-23: qty 100

## 2017-12-23 MED ORDER — FENTANYL CITRATE (PF) 100 MCG/2ML IJ SOLN
INTRAMUSCULAR | Status: AC
  Filled 2017-12-23: qty 2

## 2017-12-23 MED ORDER — OXYCODONE HCL 5 MG PO TABS
5.0000 mg | ORAL_TABLET | Freq: Once | ORAL | Status: AC | PRN
  Administered 2017-12-23: 5 mg via ORAL

## 2017-12-23 MED ORDER — OXYCODONE HCL 5 MG/5ML PO SOLN: 5.0000 mg | Freq: Once | ORAL | Status: AC | PRN

## 2017-12-23 MED ORDER — SODIUM CHLORIDE 0.9 % IJ SOLN
INTRAMUSCULAR | Status: AC
  Filled 2017-12-23: qty 20

## 2017-12-23 MED ORDER — PROPOFOL 10 MG/ML IV BOLUS
INTRAVENOUS | Status: DC | PRN
  Administered 2017-12-23: 150 mg via INTRAVENOUS

## 2017-12-23 MED ORDER — FAMOTIDINE 20 MG PO TABS
20.0000 mg | ORAL_TABLET | Freq: Once | ORAL | Status: AC
  Administered 2017-12-23: 20 mg via ORAL

## 2017-12-23 MED ORDER — HYDROCODONE-ACETAMINOPHEN 5-325 MG PO TABS: 1.0000 | ORAL_TABLET | ORAL | 0 refills | Status: DC | PRN

## 2017-12-23 MED ORDER — BUPIVACAINE-EPINEPHRINE (PF) 0.5% -1:200000 IJ SOLN
INTRAMUSCULAR | Status: AC
  Filled 2017-12-23: qty 30

## 2017-12-23 MED ORDER — METHYLENE BLUE 0.5 % INJ SOLN
INTRAVENOUS | Status: DC | PRN
  Administered 2017-12-23: 10 mL

## 2017-12-23 MED ORDER — FENTANYL CITRATE (PF) 100 MCG/2ML IJ SOLN
25.0000 ug | INTRAMUSCULAR | Status: DC | PRN
  Administered 2017-12-23 (×4): 25 ug via INTRAVENOUS

## 2017-12-23 SURGICAL SUPPLY — 59 items
BANDAGE ELASTIC 6 LF NS (GAUZE/BANDAGES/DRESSINGS) ×3 IMPLANT
BINDER BREAST LRG (GAUZE/BANDAGES/DRESSINGS) IMPLANT
BINDER BREAST MEDIUM (GAUZE/BANDAGES/DRESSINGS) IMPLANT
BINDER BREAST XLRG (GAUZE/BANDAGES/DRESSINGS) IMPLANT
BINDER BREAST XXLRG (GAUZE/BANDAGES/DRESSINGS) IMPLANT
BLADE SURG 15 STRL SS SAFETY (BLADE) ×6 IMPLANT
BNDG GAUZE 4.5X4.1 6PLY STRL (MISCELLANEOUS) ×3 IMPLANT
BULB RESERV EVAC DRAIN JP 100C (MISCELLANEOUS) IMPLANT
CANISTER SUCT 1200ML W/VALVE (MISCELLANEOUS) ×3 IMPLANT
CHLORAPREP W/TINT 26ML (MISCELLANEOUS) ×3 IMPLANT
CLOSURE WOUND 1/2 X4 (GAUZE/BANDAGES/DRESSINGS) ×1
CNTNR SPEC 2.5X3XGRAD LEK (MISCELLANEOUS) ×1
CONT SPEC 4OZ STER OR WHT (MISCELLANEOUS) ×2
CONTAINER SPEC 2.5X3XGRAD LEK (MISCELLANEOUS) ×1 IMPLANT
COVER PROBE FLX POLY STRL (MISCELLANEOUS) ×3 IMPLANT
DEVICE DUBIN SPECIMEN MAMMOGRA (MISCELLANEOUS) ×3 IMPLANT
DRAIN CHANNEL JP 15F RND 16 (MISCELLANEOUS) IMPLANT
DRAPE LAPAROTOMY 100X77 ABD (DRAPES) IMPLANT
DRAPE LAPAROTOMY TRNSV 106X77 (MISCELLANEOUS) ×3 IMPLANT
DRSG TELFA 3X8 NADH (GAUZE/BANDAGES/DRESSINGS) ×3 IMPLANT
DRSG TELFA 4X3 1S NADH ST (GAUZE/BANDAGES/DRESSINGS) ×6 IMPLANT
ELECT CAUTERY BLADE TIP 2.5 (TIP) ×3
ELECT REM PT RETURN 9FT ADLT (ELECTROSURGICAL) ×3
ELECTRODE CAUTERY BLDE TIP 2.5 (TIP) ×1 IMPLANT
ELECTRODE REM PT RTRN 9FT ADLT (ELECTROSURGICAL) ×1 IMPLANT
GAUZE FLUFF 18X24 1PLY STRL (GAUZE/BANDAGES/DRESSINGS) ×3 IMPLANT
GAUZE SPONGE 4X4 12PLY STRL (GAUZE/BANDAGES/DRESSINGS) ×3 IMPLANT
GLOVE BIO SURGEON STRL SZ7.5 (GLOVE) ×3 IMPLANT
GLOVE INDICATOR 8.0 STRL GRN (GLOVE) ×3 IMPLANT
GOWN STRL REUS W/ TWL LRG LVL3 (GOWN DISPOSABLE) ×2 IMPLANT
GOWN STRL REUS W/TWL LRG LVL3 (GOWN DISPOSABLE) ×4
KIT TURNOVER KIT A (KITS) ×3 IMPLANT
LABEL OR SOLS (LABEL) IMPLANT
MARGIN MAP 10MM (MISCELLANEOUS) ×3 IMPLANT
NDL SAFETY ECLIPSE 18X1.5 (NEEDLE) ×1 IMPLANT
NEEDLE HYPO 18GX1.5 SHARP (NEEDLE) ×2
NEEDLE HYPO 22GX1.5 SAFETY (NEEDLE) ×3 IMPLANT
NEEDLE HYPO 25X1 1.5 SAFETY (NEEDLE) ×6 IMPLANT
PACK BASIN MINOR ARMC (MISCELLANEOUS) ×3 IMPLANT
RETRACTOR RING XSMALL (MISCELLANEOUS) ×1 IMPLANT
RTRCTR WOUND ALEXIS 13CM XS SH (MISCELLANEOUS) ×3
SHEARS FOC LG CVD HARMONIC 17C (MISCELLANEOUS) IMPLANT
SHEARS HARMONIC 9CM CVD (BLADE) IMPLANT
SLEVE PROBE SENORX GAMMA FIND (MISCELLANEOUS) ×3 IMPLANT
STRIP CLOSURE SKIN 1/2X4 (GAUZE/BANDAGES/DRESSINGS) ×2 IMPLANT
SUT ETHILON 3-0 FS-10 30 BLK (SUTURE) ×3
SUT SILK 2 0 (SUTURE) ×2
SUT SILK 2-0 18XBRD TIE 12 (SUTURE) ×1 IMPLANT
SUT VIC AB 2-0 CT1 27 (SUTURE) ×2
SUT VIC AB 2-0 CT1 TAPERPNT 27 (SUTURE) ×1 IMPLANT
SUT VIC AB 4-0 FS2 27 (SUTURE) ×6 IMPLANT
SUT VICRYL+ 3-0 144IN (SUTURE) ×3 IMPLANT
SUTURE EHLN 3-0 FS-10 30 BLK (SUTURE) ×1 IMPLANT
SWABSTK COMLB BENZOIN TINCTURE (MISCELLANEOUS) ×3 IMPLANT
SYR 10ML LL (SYRINGE) ×3 IMPLANT
SYR BULB IRRIG 60ML STRL (SYRINGE) ×3 IMPLANT
SYR CONTROL 10ML (SYRINGE) ×3 IMPLANT
TAPE TRANSPORE STRL 2 31045 (GAUZE/BANDAGES/DRESSINGS) ×3 IMPLANT
WATER STERILE IRR 1000ML POUR (IV SOLUTION) ×3 IMPLANT

## 2017-12-23 NOTE — Anesthesia Procedure Notes (Signed)
Procedure Name: LMA Insertion Date/Time: 12/23/2017 10:56 AM Performed by: Hedda Slade, CRNA Pre-anesthesia Checklist: Patient identified, Patient being monitored, Timeout performed, Emergency Drugs available and Suction available Patient Re-evaluated:Patient Re-evaluated prior to induction Oxygen Delivery Method: Circle system utilized Preoxygenation: Pre-oxygenation with 100% oxygen Induction Type: IV induction Ventilation: Mask ventilation without difficulty LMA: LMA inserted LMA Size: 4.0 Tube type: Oral Number of attempts: 1 Placement Confirmation: positive ETCO2 and breath sounds checked- equal and bilateral Tube secured with: Tape Dental Injury: Teeth and Oropharynx as per pre-operative assessment

## 2017-12-23 NOTE — Op Note (Signed)
Preoperative diagnosis: Invasive carcinoma of the right breast, lower outer quadrant.  Postoperative diagnosis: Same.  Operative procedure: Right breast wide excision with ultrasound guidance, sentinel lymph node biopsy.  Operating Surgeon: Hervey Ard, MD.  Anesthesia: General by LMA, Marcaine 0.5% with 1-200,000 units of epinephrine.,  30 cc.  Estimated blood loss: Less than 5 cc.  Clinical note: This 72 year old woman recently had a mammogram completed showing an abnormal area in the lower outer quadrant of the right breast.  This corresponded on clinical exam with a palpable mass.   She underwent ultrasound-guided core biopsy showing evidence of invasive mammary carcinoma.  She desired to proceed to breast conservation.  Prior to presentation of the surgical suite she underwent injection with technetium sulfur colloid by the radiology service.  Operative note: After the induction of general anesthesia and cleansing of the skin with alcohol 5 cc of 0.5% methylene blue was injected in subareolar plexus.  The breast, chest and axilla was then cleansed with ChloraPrep and draped.  Ultrasound was used to identify the previous biopsy cavity in the 8 o'clock position of the right breast about 4-5 cm from the nipple.  A radial incision was made after instillation of local anesthesia.  The skin was incised sharply and remaining dissection completed with electrocautery.  About 1 cm below the skin level flaps were raised and an [place] wound protector placed.  A block of tissue measuring 3 x 4 x 4 centimeter was resected, orientated and sent for specimen radiograph.  A area of microcalcifications were seen with a biopsy clip was not evident.  Pathology reported finding the biopsy clip material within the specimen and margins widely negative on gross exam.  Making use of the node seeker device the axilla was approached through the wide excision site.,  Hot, non-blue nodes were identified.  These were sent  in formalin for routine histology.  Good hemostasis was noted.  The wound was closed in layers with interrupted 2-0 Vicryl sutures to the deep adipose layer followed by a similar layer of sutures to the subtendinous fat.  The skin was closed with a running 4-0 Vicryl subcuticular suture.  Benzoin, Steri-Strips, Telfa and dressing applied.  A bulky dressing followed by a surgical bra was applied.  The patient tolerated the procedure well and was taken to recovery room in stable condition.

## 2017-12-23 NOTE — Anesthesia Preprocedure Evaluation (Signed)
Anesthesia Evaluation  Patient identified by MRN, date of birth, ID band Patient awake    Reviewed: Allergy & Precautions, H&P , NPO status , Patient's Chart, lab work & pertinent test results  History of Anesthesia Complications Negative for: history of anesthetic complications  Airway Mallampati: III  TM Distance: <3 FB Neck ROM: limited    Dental  (+) Chipped, Poor Dentition, Caps   Pulmonary neg shortness of breath,           Cardiovascular Exercise Tolerance: Good (-) angina(-) Past MI and (-) DOE negative cardio ROS       Neuro/Psych  Neuromuscular disease negative psych ROS   GI/Hepatic negative GI ROS, Neg liver ROS,   Endo/Other  Hypothyroidism   Renal/GU      Musculoskeletal   Abdominal   Peds  Hematology negative hematology ROS (+)   Anesthesia Other Findings Signs and symptoms suggestive of sleep apnea   Past Medical History: No date: Allergic rhinitis, cause unspecified No date: Arthritis No date: Bronchitis     Comment:  h/o No date: Cancer ([place]) No date: Cataract No date: Fibromyalgia No date: HLD (hyperlipidemia) No date: Hypothyroidism 2011: Menieres disease No date: Other abnormal glucose No date: 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  Outlaw in [place].  Repeat 5 years. 03/30/2004: COLONOSCOPY W/ BIOPSIES     Comment:  Adenomatous polyp of the cecum, hyperplastic polyps of               the splenic flexure, upper rectum and mid  rectum. 10/16/2011: COLPOSCOPY     Comment:  HGSIL pap smear.  Negative. 09/2008: EXTERNAL EAR SURGERY     Comment:  external - not specified  (RIGHT)     Reproductive/Obstetrics negative OB ROS                             Anesthesia Physical Anesthesia Plan  ASA: III  Anesthesia Plan: General   Post-op Pain Management:    Induction: Intravenous  PONV Risk Score and Plan: 2 and Ondansetron, Dexamethasone and Midazolam  Airway Management Planned: LMA  Additional Equipment:   Intra-op Plan:   Post-operative Plan: Extubation in OR  Informed Consent: I have reviewed the patients History and Physical, chart, labs and discussed the procedure including the risks, benefits and alternatives for the proposed anesthesia with the patient or authorized representative who has indicated his/her understanding and acceptance.   Dental Advisory Given  Plan Discussed with: Anesthesiologist, CRNA and Surgeon  Anesthesia Plan Comments: (Patient consented for risks of anesthesia including but not limited to:  - adverse reactions to medications - damage to teeth, lips or other oral mucosa - sore throat or hoarseness - Damage to heart, brain, lungs or loss of life  Patient voiced understanding.)        Anesthesia Quick Evaluation

## 2017-12-23 NOTE — Transfer of Care (Signed)
Immediate Anesthesia Transfer of Care Note  Patient: Carol [name]  Procedure(s) Performed: MASTECTOMY PARTIAL (Right ) SENTINEL NODE BIOPSY (Right )  Patient Location: PACU  Anesthesia Type:General  Level of Consciousness: sedated  Airway & Oxygen Therapy: Patient Spontanous Breathing and Patient connected to face mask oxygen  Post-op Assessment: Report given to RN and Post -op Vital signs reviewed and stable  Post vital signs: Reviewed and stable  Last Vitals:  Vitals:   12/23/17 0833 12/23/17 1218  BP: 132/62 118/65  Pulse: 74 91  Resp: 18 15  Temp: 36.9 C 37.2 C  SpO2: 96% 99%    Last Pain:  Vitals:   12/23/17 0833  TempSrc: Oral         Complications: No apparent anesthesia complications

## 2017-12-23 NOTE — Anesthesia Post-op Follow-up Note (Signed)
Anesthesia QCDR form completed.        

## 2017-12-23 NOTE — H&P (Signed)
Carol [name] [id number] [birth year]     HPI:  Recently diagnosed with invasive cancer. Has elected on breast conservation.  Tolerated SLN injection well.   Medications Prior to Admission  Medication Sig Dispense Refill Last Dose  . Ascorbic Acid (VITAMIN C) 1000 MG tablet Take 1,000 mg by mouth daily.   Past Week at Unknown time  . aspirin 81 MG tablet Take 81 mg by mouth daily.    12/20/2017  . atorvastatin (LIPITOR) 20 MG tablet TAKE ONE (1) TABLET BY MOUTH EVERY DAY (Patient taking differently: Take 20 mg by mouth daily at 6 PM. TAKE ONE (1) TABLET BY MOUTH EVERY DAY) 90 tablet 1 12/22/2017 at Unknown time  . cetirizine (ZYRTEC) 10 MG tablet Take 10 mg by mouth every morning.    12/23/2017 at 0600  . Cholecalciferol (VITAMIN D3) 2000 UNITS capsule Take 2,000 Units by mouth daily.     Past Week at Unknown time  . Coenzyme Q10 (COQ-10) 200 MG CAPS Take 200 mg by mouth daily.   Past Week at Unknown time  . fluticasone (FLONASE) 50 MCG/ACT nasal spray Place 2 sprays into both nostrils daily. (Patient taking differently: Place 2 sprays into both nostrils daily as needed for allergies. ) 16 g 6 Taking  . furosemide (LASIX) 20 MG tablet Take 1 tablet (20 mg total) by mouth 2 (two) times daily. 180 tablet 1 12/22/2017 at Unknown time  . Glucosamine Sulfate 1000 MG CAPS Take 1,000 mg by mouth daily.    Past Week at Unknown time  . levothyroxine (SYNTHROID, LEVOTHROID) 50 MCG tablet Take 1 tablet (50 mcg total) by mouth daily. (Patient taking differently: Take 50 mcg by mouth daily before breakfast. ) 90 tablet 1 12/23/2017 at 0400  . Misc Natural Products (OSTEO BI-FLEX JOINT SHIELD) TABS Take 2 tablets by mouth daily. With magnesium    Past Week at Unknown time  . Multiple Vitamin (MULTIVITAMIN) tablet Take 1 tablet by mouth daily.   Past Week at Unknown time  . Olopatadine HCl (PATADAY OP) Place 1 drop into both eyes daily as needed (allergies).   Past Week at Unknown time  . Polyvinyl  Alcohol-Povidone (REFRESH OP) Place 1 drop into both eyes daily as needed (dry eyes).   Past Week at Unknown time  . rOPINIRole (REQUIP) 0.5 MG tablet TAKE 1 TABLET AT BEDTIME 90 tablet 1 12/22/2017 at Unknown time  . senna (SENOKOT) 8.6 MG tablet Take 1-6 tablets by mouth at bedtime.    12/20/2017   No Known Allergies Past Medical History:  Diagnosis Date  . Allergic rhinitis, cause unspecified   . Arthritis   . Bronchitis    h/o  . Cancer ([place])   . Cataract   . Fibromyalgia   . HLD (hyperlipidemia)   . Hypothyroidism   . 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 Other abnormal glucose   . 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 Personal history of colonic polyps   . Pre-diabetes   . Shingles   . Swelling of limb   . Thyroid goiter   . Tobacco use disorder   . Unspecified sinusitis (chronic)    Past Surgical History:  Procedure Laterality Date  . APPENDECTOMY  06/1997  . BREAST BIOPSY Right 2002    core with clip  . BREAST BIOPSY Right 2011  . COLONOSCOPY  10/15/2012   mutliple polyps.  Outlaw in [place].  Repeat 5 years.  . COLONOSCOPY W/ BIOPSIES  03/30/2004  Adenomatous polyp of the cecum, hyperplastic polyps of the splenic flexure, upper rectum and mid rectum.  . COLPOSCOPY  10/16/2011   HGSIL pap smear.  Negative.  Marland Kitchen EXTERNAL EAR SURGERY  09/2008   external - not specified  (RIGHT)   Social History   Socioeconomic History  . Marital status: Married    Spouse name: Not on file  . Number of children: 2  . Years of education: Not on file  . Highest education level: Not on file  Social Needs  . Financial resource strain: Not on file  . Food insecurity - worry: Not on file  . Food insecurity - inability: Not on file  . Transportation needs - medical: Not on file  . Transportation needs - non-medical: Not on file  Occupational History  . Occupation: homemaker  Tobacco Use  . Smoking status: Former Smoker    Packs/day: 1.00     Years: 40.00    Pack years: 40.00    Types: Cigarettes    Last attempt to quit: 04/02/2003    Years since quitting: 14.7  . Smokeless tobacco: Never Used  . Tobacco comment: 7-10 cigarettes for past 40 years   Substance and Sexual Activity  . Alcohol use: Yes    Comment: occasional  5 drinks per year  . Drug use: No  . Sexual activity: Yes    Birth control/protection: Post-menopausal  Other Topics Concern  . Not on file  Social History Narrative   Marital status: married x 60 years; happily married; no abuse.      Children: 2 sons; one granddaughter (88), 1 adopted grandson (1) in [place].      Lives: with husband.      Employment: unemployed/homemaker.      Tobacco: quit smoking 03/2012.  Electronic cigarette.      Alcohol: socially; 2 glasses per year.      Exercise:  None/sporadic      Seatbelt:  Always uses seat belts.   Smoke alarm and carbon monoxide detector in the home.       Guns:  No guns.      Caffeine JME:QASTMH 3 servings per day.      ADLs: independent with ADLs in 2018; drives; no assistant devices.       Advanced Directives: yes; DNR/DNI   Social History   Social History Narrative   Marital status: married x 51 years; happily married; no abuse.      Children: 2 sons; one granddaughter (44), 1 adopted grandson (1) in [place].      Lives: with husband.      Employment: unemployed/homemaker.      Tobacco: quit smoking 03/2012.  Electronic cigarette.      Alcohol: socially; 2 glasses per year.      Exercise:  None/sporadic      Seatbelt:  Always uses seat belts.   Smoke alarm and carbon monoxide detector in the home.       Guns:  No guns.      Caffeine DQQ:IWLNLG 3 servings per day.      ADLs: independent with ADLs in 2018; drives; no assistant devices.       Advanced Directives: yes; DNR/DNI     ROS: Negative.     PE: HEENT: Negative. Lungs: Clear. Cardio: RRForest Gleason Byrnett 12/23/2017   Assessment/Plan:  Proceed with planned right breast wide  excision and SLN biopsy.

## 2017-12-23 NOTE — Discharge Instructions (Signed)

## 2017-12-24 ENCOUNTER — Encounter: Payer: Self-pay | Admitting: General Surgery

## 2017-12-24 NOTE — Anesthesia Postprocedure Evaluation (Signed)
Anesthesia Post Note  Patient: Carol [name]  Procedure(s) Performed: MASTECTOMY PARTIAL (Right ) SENTINEL NODE BIOPSY (Right )  Patient location during evaluation: PACU Anesthesia Type: General Level of consciousness: awake and alert Pain management: pain level controlled Vital Signs Assessment: post-procedure vital signs reviewed and stable Respiratory status: spontaneous breathing, nonlabored ventilation, respiratory function stable and patient connected to nasal cannula oxygen Cardiovascular status: blood pressure returned to baseline and stable Postop Assessment: no apparent nausea or vomiting Anesthetic complications: no     Last Vitals:  Vitals:   12/23/17 1323 12/23/17 1351  BP: 102/71 (!) 146/66  Pulse: 80 86  Resp: 16   Temp: (!) 35.9 C   SpO2: 94% 95%    Last Pain:  Vitals:   12/23/17 1351  TempSrc:   PainSc: 3                  Precious Haws Aubriana Ravelo

## 2017-12-26 ENCOUNTER — Telehealth: Payer: Self-pay | Admitting: General Surgery

## 2017-12-26 LAB — SURGICAL PATHOLOGY

## 2017-12-26 NOTE — Telephone Encounter (Signed)
Nitrified pathology showed clear margins, negative lymph nodes.   Reports doing well. Irritation from the compressive wrap improved when she changed over to her own bra.   F/U next week as planned.

## 2017-12-31 ENCOUNTER — Encounter: Payer: Self-pay | Admitting: General Surgery

## 2017-12-31 ENCOUNTER — Inpatient Hospital Stay (INDEPENDENT_AMBULATORY_CARE_PROVIDER_SITE_OTHER): Payer: Medicare Other

## 2017-12-31 ENCOUNTER — Ambulatory Visit: Payer: Medicare Other | Admitting: General Surgery

## 2017-12-31 VITALS — BP 144/80 | HR 72 | Resp 12 | Ht 64.0 in | Wt 220.0 lb

## 2017-12-31 DIAGNOSIS — C50411 Malignant neoplasm of upper-outer quadrant of right female breast: Secondary | ICD-10-CM

## 2017-12-31 NOTE — Patient Instructions (Addendum)
Patient to be scheduled to see Dr. Baruch Gouty .  Wear a bra until breast doesn't feel heavy.The patient is aware to call back for any questions or concerns.  The patient is scheduled to follow up with Dr Baruch Gouty at the cancer center on 01/03/18.

## 2017-12-31 NOTE — Progress Notes (Signed)
Patient ID: Carol [name], female   DOB: 07/17/1946, 72 y.o.   MRN: [id number]  Chief Complaint  Patient presents with  . Routine Post Op    HPI Carol [name] is a 72 y.o. female here today for her post op right partial mastectomy done on 12/23/2017. Patient noticed a rash under her right arm pit.  She is here with Maylon Cos her husband.   HPI  Past Medical History:  Diagnosis Date  . Allergic rhinitis, cause unspecified   . Arthritis   . Bronchitis    h/o  . Cancer ([place])   . Cataract   . Fibromyalgia   . HLD (hyperlipidemia)   . Hypothyroidism   . 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 Other abnormal glucose   . 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 Personal history of colonic polyps   . Pre-diabetes   . Shingles   . Swelling of limb   . Thyroid goiter   . Tobacco use disorder   . Unspecified sinusitis (chronic)     Past Surgical History:  Procedure Laterality Date  . APPENDECTOMY  06/1997  . BREAST BIOPSY Right 2002    core with clip  . BREAST BIOPSY Right 2011  . BREAST EXCISIONAL BIOPSY Right 12/23/2017   right breast lumpetcomy  . COLONOSCOPY  10/15/2012   mutliple polyps.  Outlaw in [place].  Repeat 5 years.  . COLONOSCOPY W/ BIOPSIES  03/30/2004   Adenomatous polyp of the cecum, hyperplastic polyps of the splenic flexure, upper rectum and mid rectum.  . COLPOSCOPY  10/16/2011   HGSIL pap smear.  Negative.  Marland Kitchen EXTERNAL EAR SURGERY  09/2008   external - not specified  (RIGHT)  . MASTECTOMY, PARTIAL Right 12/23/2017   T1b, N0; 8 mm ER: 100%; PR: 100%; Her 2 neu not overexpressed.  Surgeon: Robert Bellow, MD;  Location: [facility] ORS;  Service: General;  Laterality: Right;  . SENTINEL NODE BIOPSY Right 12/23/2017   Procedure: SENTINEL NODE BIOPSY;  Surgeon: Robert Bellow, MD;  Location: [facility] ORS;  Service: General;  Laterality: Right;    Family History  Problem Relation Age of Onset  . Heart failure  Father   . Hypertension Father   . Stroke Father   . Hyperlipidemia Father   . Heart failure Brother   . Diabetes Brother   . Heart disease Brother   . Diabetes Brother   . Heart disease Brother   . Hypertension Sister   . Depression Sister   . Hypertension Sister   . Hyperlipidemia Sister   . Arthritis Unknown   . Coronary artery disease Unknown   . Hyperlipidemia Brother   . Hypertension Brother   . Breast cancer Neg Hx     Social History Social History   Tobacco Use  . Smoking status: Former Smoker    Packs/day: 1.00    Years: 40.00    Pack years: 40.00    Types: Cigarettes    Last attempt to quit: 04/02/2003    Years since quitting: 14.7  . Smokeless tobacco: Never Used  . Tobacco comment: 7-10 cigarettes for past 40 years   Substance Use Topics  . Alcohol use: Yes    Comment: occasional  5 drinks per year  . Drug use: No    No Known Allergies  Current Outpatient Medications  Medication Sig Dispense Refill  . Ascorbic Acid (VITAMIN C) 1000 MG tablet Take 1,000 mg by mouth daily.    Marland Kitchen  aspirin 81 MG tablet Take 81 mg by mouth daily.     Marland Kitchen atorvastatin (LIPITOR) 20 MG tablet TAKE ONE (1) TABLET BY MOUTH EVERY DAY (Patient taking differently: Take 20 mg by mouth daily at 6 PM. TAKE ONE (1) TABLET BY MOUTH EVERY DAY) 90 tablet 1  . cetirizine (ZYRTEC) 10 MG tablet Take 10 mg by mouth every morning.     . Cholecalciferol (VITAMIN D3) 2000 UNITS capsule Take 2,000 Units by mouth daily.      . Coenzyme Q10 (COQ-10) 200 MG CAPS Take 200 mg by mouth daily.    . fluticasone (FLONASE) 50 MCG/ACT nasal spray Place 2 sprays into both nostrils daily. (Patient taking differently: Place 2 sprays into both nostrils daily as needed for allergies. ) 16 g 6  . furosemide (LASIX) 20 MG tablet Take 1 tablet (20 mg total) by mouth 2 (two) times daily. 180 tablet 1  . Glucosamine Sulfate 1000 MG CAPS Take 1,000 mg by mouth daily.     Marland Kitchen HYDROcodone-acetaminophen (NORCO/VICODIN) 5-325 MG  tablet Take 1 tablet by mouth every 4 (four) hours as needed for moderate pain. 20 tablet 0  . levothyroxine (SYNTHROID, LEVOTHROID) 50 MCG tablet Take 1 tablet (50 mcg total) by mouth daily. (Patient taking differently: Take 50 mcg by mouth daily before breakfast. ) 90 tablet 1  . Misc Natural Products (OSTEO BI-FLEX JOINT SHIELD) TABS Take 2 tablets by mouth daily. With magnesium     . Multiple Vitamin (MULTIVITAMIN) tablet Take 1 tablet by mouth daily.    . Olopatadine HCl (PATADAY OP) Place 1 drop into both eyes daily as needed (allergies).    . Polyvinyl Alcohol-Povidone (REFRESH OP) Place 1 drop into both eyes daily as needed (dry eyes).    Marland Kitchen rOPINIRole (REQUIP) 0.5 MG tablet TAKE 1 TABLET AT BEDTIME 90 tablet 1  . senna (SENOKOT) 8.6 MG tablet Take 1-6 tablets by mouth at bedtime.      No current facility-administered medications for this visit.     Review of Systems Review of Systems  Constitutional: Negative.   Respiratory: Negative.   Cardiovascular: Negative.     Blood pressure (!) 144/80, pulse 72, resp. rate 12, height '5\' 4"'$  (1.626 m), weight 220 lb (99.8 kg).  Physical Exam Physical Exam  Constitutional: She is oriented to person, place, and time. She appears well-developed and well-nourished.  Pulmonary/Chest:    Neurological: She is alert and oriented to person, place, and time.  Skin: Skin is warm and dry.    Data Reviewed PATHOLOGY Surgical Pathology  CASE: [id number]  PATIENT: Carol [name]  Surgical Pathology Report      SPECIMEN SUBMITTED:  A. Breast, right; wide excision  B. Sentinel node 1 and 2   CLINICAL HISTORY:  None provided   PRE-OPERATIVE DIAGNOSIS:  Right breast cancer   POST-OPERATIVE DIAGNOSIS:  Same as pre-op      DIAGNOSIS:  A. 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  - BIOPSY SITE CHANGES, MARKER CLIP PRESENT.  - THE SURGICAL MARGINS ARE  NEGATIVE.  - SEE SUMMARY BELOW.   B. SENTINEL LYMPH NODE 1 AND 2; EXCISION:  - NO TUMOR SEEN IN TWO LYMPH NODES (0/2).   Surgical Pathology Cancer Case Summary   INVASIVE CARCINOMA OF THE BREAST  Procedure: Wide excision with ultrasound guidance  Specimen Laterality: Right  Histologic Type: Invasive carcinoma of no special type  Histologic Grade (Nottingham Histologic Score)  Glandular (Acinar)/Tubular Differentiation: 2       Nuclear Pleomorphism: 2      Mitotic Rate: 1       Overall Grade: 1  Tumor Size: 8 mm  Ductal Carcinoma In Situ (DCIS): Present  Margins: Negative, closest margin to invasive is anterior (2 mm); DCIS  (14 mm, deep)  Regional Lymph nodes    Estrogen Receptor: 100%, POSITIVE,  Progesterone Receptor: 100%, POSITIVE  Proliferation Marker Ki67: 10%  HER 2 neu FISH: NEGATIVE   Ultrasound examination of the wide excision site in the medial aspect of the right breast showed a well-defined seroma cavity measuring 1.6 x 2.4 x 2.7 cm and a depth of 1.9 cm from the skin. Adequate for MammoSite balloon placement if accelerated partial breast radiation is chosen.    Assessment    Doing well post wide excision and sentinel node biopsy.  Candidate for gene testing based on NCCN guidleline.  Candidate for anti-estrogen therapy post radiation.     Plan  Patient to be scheduled to see Dr. Baruch Gouty .  Wear a bra until breast doesn't feel heavy.The patient is aware to call back for any questions or concerns.  Will send sample for Oncotype DX testing.   HPI, Physical Exam, Assessment and Plan have been scribed under the direction and in the presence of Hervey Ard, MD.  Gaspar Cola, CMA  I have completed the exam and reviewed the above documentation for accuracy and completeness.  I agree with the above.  Haematologist has been used and any errors in dictation or transcription are unintentional.  Hervey Ard, M.D.,  F.A.C.S.  The patient is scheduled to follow up with Dr Baruch Gouty at the cancer center on 01/03/18. Documented by Caryl-Lyn Otis Brace LPN  Forest Gleason Wilfrido Luedke 12/31/2017, 8:49 PM

## 2018-01-01 ENCOUNTER — Other Ambulatory Visit: Payer: Self-pay | Admitting: *Deleted

## 2018-01-01 ENCOUNTER — Encounter: Payer: Self-pay | Admitting: *Deleted

## 2018-01-01 ENCOUNTER — Telehealth: Payer: Self-pay | Admitting: *Deleted

## 2018-01-01 NOTE — Progress Notes (Signed)
  Oncology Nurse Navigator Documentation  Navigator Location: [facility] (01/01/18 1100) Referral date to RadOnc/MedOnc: 01/03/18 (01/01/18 1100) )Navigator Encounter Type: Introductory phone call (01/01/18 1100)   Abnormal Finding Date: 11/01/17 (01/01/18 1100) Confirmed Diagnosis Date: 12/02/17 (01/01/18 1100) Surgery Date: 12/23/17 (01/01/18 1100)                 Barriers/Navigation Needs: Education (01/01/18 1100) Education: Newly Diagnosed Cancer Education (01/01/18 1100) Interventions: Education (01/01/18 1100)     Education Method: Written (01/01/18 1100)                Time Spent with Patient: 15 (01/01/18 1100)   Called patient today to establish navigation services.  Patient states she did get her educational literature, "My Breast Cancer Treatment Handbook" by Josephine Igo, RN.  Dr. Bary Castilla gave it to her yesterday.  Patient did not want to talk today and request I call her back at a later date.  She is scheduled to see Dr. Baruch Gouty on Friday for consult for possible mammosite.

## 2018-01-01 NOTE — Telephone Encounter (Signed)
-----   Message from Robert Bellow, MD sent at 12/31/2017  8:51 PM EDT ----- Please have tumor blocks send for Oncotype Dx testing. Thanks.

## 2018-01-01 NOTE — Telephone Encounter (Signed)
Oncotype order form with pathology faxed to [facility].

## 2018-01-03 ENCOUNTER — Encounter: Payer: Self-pay | Admitting: Radiation Oncology

## 2018-01-03 ENCOUNTER — Ambulatory Visit
Admission: RE | Admit: 2018-01-03 | Discharge: 2018-01-03 | Disposition: A | Discharge status: Home / Self Care | Payer: Medicare Other | Source: Ambulatory Visit | Attending: Radiation Oncology | Admitting: Radiation Oncology

## 2018-01-03 ENCOUNTER — Other Ambulatory Visit: Payer: Self-pay

## 2018-01-03 VITALS — BP 136/72 | HR 83 | Temp 97.7°F | Resp 20 | Wt 218.9 lb

## 2018-01-03 DIAGNOSIS — M797 Fibromyalgia: Secondary | ICD-10-CM | POA: Insufficient documentation

## 2018-01-03 DIAGNOSIS — C50811 Malignant neoplasm of overlapping sites of right female breast: Secondary | ICD-10-CM | POA: Insufficient documentation

## 2018-01-03 DIAGNOSIS — M129 Arthropathy, unspecified: Secondary | ICD-10-CM | POA: Insufficient documentation

## 2018-01-03 DIAGNOSIS — H8109 Meniere's disease, unspecified ear: Secondary | ICD-10-CM | POA: Diagnosis not present

## 2018-01-03 DIAGNOSIS — Z79899 Other long term (current) drug therapy: Secondary | ICD-10-CM | POA: Diagnosis not present

## 2018-01-03 DIAGNOSIS — Z7982 Long term (current) use of aspirin: Secondary | ICD-10-CM | POA: Diagnosis not present

## 2018-01-03 DIAGNOSIS — E785 Hyperlipidemia, unspecified: Secondary | ICD-10-CM | POA: Diagnosis not present

## 2018-01-03 DIAGNOSIS — C50411 Malignant neoplasm of upper-outer quadrant of right female breast: Secondary | ICD-10-CM

## 2018-01-03 DIAGNOSIS — E039 Hypothyroidism, unspecified: Secondary | ICD-10-CM | POA: Diagnosis not present

## 2018-01-03 DIAGNOSIS — Z87891 Personal history of nicotine dependence: Secondary | ICD-10-CM | POA: Diagnosis not present

## 2018-01-03 DIAGNOSIS — Z170 Estrogen receptor positive status [ER+]: Secondary | ICD-10-CM | POA: Diagnosis not present

## 2018-01-03 DIAGNOSIS — Z86010 Personal history of colonic polyps: Secondary | ICD-10-CM | POA: Diagnosis not present

## 2018-01-03 NOTE — Consult Note (Signed)
NEW PATIENT EVALUATION  Name: Shlonda Dolloff  MRN: [id number]  Date:   01/03/2018     DOB: 05/05/1946   This 72 y.o. 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 Breast Cancer    Pt is here for initial consultation of breast cancer.      DIAGNOSIS: The encounter diagnosis was Malignant neoplasm of upper-outer quadrant of right female breast, unspecified estrogen receptor status ([place]).   PREVIOUS INVESTIGATIONS:  Mammogram and ultrasound reviewed Pathology reports reviewed Clinical notes reviewed  HPI: patient is a 72 year old female who presented with an abnormal mammogram of her right breast showing a spiculated mass of the right breast at the 9:00 position concerning for malignancy. Ultrasound also showed a hypoechoic lesion at the 9:00 position 6 cm from nipple measuring 0.8 cm in greatest dimension. She has stable mass in the upper outer quadrant of the left breast which is being followed. Targeted ultrasound revealed grade 1 invasive mammary carcinoma ER/PR positive. She went on to have a wide local excision and sentinel node biopsy showing 8 mm area of grade 1invasive mammary carcinoma margins clear with invasive component at 2 mm there was a DCIS component with a 1.4 cm margin. 2 sentinel lymph nodes were negative for metastatic disease.tumor again was strongly ER/PR positive HER-2/neu not overexpressed. Her post operative seroma cavity seem to be appropriate for MammoSite balloon placement. She is done well postoperatively. She is now seen for radiation oncology opinion. She specifically denies breast tenderness cough or bone pain.  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    PAST SURGICAL HISTORY:  Past Surgical History:  Procedure Laterality Date  . APPENDECTOMY  06/1997  . BREAST BIOPSY Right 2002    core with clip  . BREAST BIOPSY Right 2011  . BREAST EXCISIONAL BIOPSY Right 12/23/2017   right breast lumpetcomy  . COLONOSCOPY  10/15/2012   mutliple polyps.  Outlaw in [place].  Repeat 5 years.  . COLONOSCOPY W/ BIOPSIES  03/30/2004   Adenomatous polyp of the cecum, hyperplastic polyps of the splenic flexure, upper rectum and mid rectum.  . COLPOSCOPY  10/16/2011   HGSIL pap smear.  Negative.  Marland Kitchen EXTERNAL EAR SURGERY  09/2008   external - not specified  (RIGHT)  . MASTECTOMY, PARTIAL Right 12/23/2017   T1b, N0; 8 mm ER: 100%; PR: 100%; Her 2 neu not overexpressed.  Surgeon: Robert Bellow, MD;  Location: [facility] ORS;  Service: General;  Laterality: Right;  . SENTINEL NODE BIOPSY Right 12/23/2017   Procedure: SENTINEL NODE BIOPSY;  Surgeon: Robert Bellow, MD;  Location: [facility] ORS;  Service: General;  Laterality: Right;    FAMILY HISTORY: family history includes Arthritis in her unknown relative; Coronary artery disease in her unknown relative; Depression in her sister; Diabetes in her brother and brother; Heart disease in her brother and brother; Heart failure in her brother and father;  Hyperlipidemia in her brother, father, and sister; Hypertension in her brother, father, sister, and sister; Stroke in her father.  SOCIAL HISTORY:  reports that she quit smoking about 14 years ago. Her smoking use  included cigarettes. She has a 40.00 pack-year smoking history. She has never used smokeless tobacco. She reports that she drinks alcohol. She reports that she does not use drugs.  ALLERGIES: Patient has no known allergies.  MEDICATIONS:  Current Outpatient Medications  Medication Sig Dispense Refill  . Ascorbic Acid (VITAMIN C) 1000 MG tablet Take 1,000 mg by mouth daily.    Marland Kitchen aspirin 81 MG tablet Take 81 mg by mouth daily.     Marland Kitchen atorvastatin (LIPITOR) 20 MG tablet TAKE ONE (1) TABLET BY MOUTH EVERY DAY (Patient taking differently: Take 20 mg by mouth daily at 6 PM. TAKE ONE (1) TABLET BY MOUTH EVERY DAY) 90 tablet 1  . cetirizine (ZYRTEC) 10 MG tablet Take 10 mg by mouth every morning.     . Cholecalciferol (VITAMIN D3) 2000 UNITS capsule Take 2,000 Units by mouth daily.      . Coenzyme Q10 (COQ-10) 200 MG CAPS Take 200 mg by mouth daily.    . fluticasone (FLONASE) 50 MCG/ACT nasal spray Place 2 sprays into both nostrils daily. (Patient taking differently: Place 2 sprays into both nostrils daily as needed for allergies. ) 16 g 6  . furosemide (LASIX) 20 MG tablet Take 1 tablet (20 mg total) by mouth 2 (two) times daily. 180 tablet 1  . Glucosamine Sulfate 1000 MG CAPS Take 1,000 mg by mouth daily.     Marland Kitchen HYDROcodone-acetaminophen (NORCO/VICODIN) 5-325 MG tablet Take 1 tablet by mouth every 4 (four) hours as needed for moderate pain. 20 tablet 0  . levothyroxine (SYNTHROID, LEVOTHROID) 50 MCG tablet Take 1 tablet (50 mcg total) by mouth daily. (Patient taking differently: Take 50 mcg by mouth daily before breakfast. ) 90 tablet 1  . Misc Natural Products (OSTEO BI-FLEX JOINT SHIELD) TABS Take 2 tablets by mouth daily. With magnesium     . Multiple Vitamin (MULTIVITAMIN) tablet Take 1 tablet by mouth daily.    . Olopatadine HCl (PATADAY OP) Place 1 drop into both eyes daily as needed (allergies).    . Polyvinyl Alcohol-Povidone (REFRESH OP) Place 1 drop into both eyes daily as needed (dry eyes).     Marland Kitchen rOPINIRole (REQUIP) 0.5 MG tablet TAKE 1 TABLET AT BEDTIME 90 tablet 1  . senna (SENOKOT) 8.6 MG tablet Take 1-6 tablets by mouth at bedtime.      No current facility-administered medications for this encounter.     ECOG PERFORMANCE STATUS:  0 - Asymptomatic  REVIEW OF SYSTEMS:  Patient denies any weight loss, fatigue, weakness, fever, chills or night sweats. Patient denies any loss of vision, blurred vision. Patient denies any ringing  of the ears or hearing loss. No irregular heartbeat. Patient denies heart murmur or history of fainting. Patient denies any chest pain or pain radiating to her upper extremities. Patient denies any shortness of breath, difficulty breathing at night, cough or hemoptysis. Patient denies any swelling in the lower legs. Patient denies any nausea vomiting, vomiting of blood, or coffee ground material in the vomitus. Patient denies any stomach pain. Patient states has had normal bowel movements no significant constipation or diarrhea. Patient denies any dysuria, hematuria or significant nocturia. Patient denies any problems walking, swelling in the joints or loss of balance. Patient denies any skin changes, loss of hair or loss of  weight. Patient denies any excessive worrying or anxiety or significant depression. Patient denies any problems with insomnia. Patient denies excessive thirst, polyuria, polydipsia. Patient denies any swollen glands, patient denies easy bruising or easy bleeding. Patient denies any recent infections, allergies or URI. Patient "s visual fields have not changed significantly in recent time.    PHYSICAL EXAM: BP 136/72   Pulse 83   Temp 97.7 F (36.5 C)   Resp 20   Wt 218 lb 14.7 oz (99.3 kg)   BMI 37.58 kg/m  Right breast has a wide local excision scar which is healing well. There is no separate axillary incision. No dominant mass or nodularity is noted in either breast in 2 positions examined. No axillary or supraclavicular adenopathy is  appreciated. Well-developed well-nourished patient in NAD. HEENT reveals PERLA, EOMI, discs not visualized.  Oral cavity is clear. No oral mucosal lesions are identified. Neck is clear without evidence of cervical or supraclavicular adenopathy. Lungs are clear to A&P. Cardiac examination is essentially unremarkable with regular rate and rhythm without murmur rub or thrill. Abdomen is benign with no organomegaly or masses noted. Motor sensory and DTR levels are equal and symmetric in the upper and lower extremities. Cranial nerves II through XII are grossly intact. Proprioception is intact. No peripheral adenopathy or edema is identified. No motor or sensory levels are noted. Crude visual fields are within normal range.  LABORATORY DATA: pathology reports reviewed    RADIOLOGY RESULTS:mammogram and ultrasound reviewed   IMPRESSION: stage I (T1 b N0 M0) ER/PR positive invasive mammary carcinoma of the right breast status post wide local excision and sentinel node biopsy in 72 year old female  PLAN: at this time believe patient would be an excellent candidate for accelerated partial breast irradiation. I have gone over both whole breast radiation as well as accelerated partial breast radiation. Risks and benefits of both types of procedures were reviewed with the patient and her husband. Patient is interested in accelerated partial breast radiation and I have recommended her back to surgeon for MammoSite balloon placement. Risks and benefits of treatment including thickening of the lumpectomy site possible small postage stamp area of skin reaction fatigue all were discussed in detail with the patient. Patient also will be a candidate for antiestrogen therapy after completion of radiation. I believe Oncotype DX is being performed which may affect recommendations as far as chemotherapy is concerned although I doubt with her well differentiated lesion this will be necessary. Patient and husband both comp in her  treatment plan well. We will coordinate her balloon catheter placement and follow-up treatment planning over the next several days.  I would like to take this opportunity to thank you for allowing me to participate in the care of your patient.Noreene Filbert, MD

## 2018-01-08 ENCOUNTER — Telehealth: Payer: Self-pay | Admitting: *Deleted

## 2018-01-08 MED ORDER — CEFADROXIL 500 MG PO CAPS: 500.0000 mg | ORAL_CAPSULE | Freq: Two times a day (BID) | ORAL | 0 refills | Status: DC

## 2018-01-08 NOTE — Telephone Encounter (Signed)
Mammosite schedule reviewed with the patient Placement   01-21-18 at [facility] at 8:00 Scan 01-23-18 Treat April 15-19 Aware the [facility] will be calling her for more details Aware of ATB and directions reviewed. Aware no showers and to wear her bra while mammosite in place. Pt agrees.

## 2018-01-13 ENCOUNTER — Telehealth: Payer: Self-pay | Admitting: General Surgery

## 2018-01-13 NOTE — Telephone Encounter (Signed)
Based on the T1b (8 mm) tumor, Oncotype DX testing was requested.  This shows a low chance of recurrence, less than 1% benefit from adjuvant chemotherapy.  Patient has met with radiation oncology and is presently planning on accelerated partial breast radiation.  Appointment in place for MammoSite balloon placement on January 21, 2018.

## 2018-01-14 ENCOUNTER — Encounter: Payer: Self-pay | Admitting: General Surgery

## 2018-01-16 ENCOUNTER — Encounter: Payer: Self-pay | Admitting: General Surgery

## 2018-01-21 ENCOUNTER — Ambulatory Visit: Payer: Self-pay

## 2018-01-21 ENCOUNTER — Encounter: Payer: Self-pay | Admitting: General Surgery

## 2018-01-21 ENCOUNTER — Ambulatory Visit (INDEPENDENT_AMBULATORY_CARE_PROVIDER_SITE_OTHER): Payer: Medicare Other | Admitting: General Surgery

## 2018-01-21 VITALS — BP 124/74 | HR 84 | Resp 18 | Ht 64.0 in | Wt 216.0 lb

## 2018-01-21 DIAGNOSIS — C50411 Malignant neoplasm of upper-outer quadrant of right female breast: Secondary | ICD-10-CM

## 2018-01-21 DIAGNOSIS — Z170 Estrogen receptor positive status [ER+]: Secondary | ICD-10-CM

## 2018-01-21 NOTE — Progress Notes (Signed)
Patient ID: Carol [name], female   DOB: [birth year], 72 y.o.   MRN: [id number]  Chief Complaint  Patient presents with  . Procedure    HPI Carol [name] is a 72 y.o. female.  Here for right mamosite placement. The patient reports that she had discussed options for radiation therapy with Dr. Baruch Gouty, and desires to proceed with accelerated partial breast radiation.  She had taken her oral antibiotic as requested.  HPI  Past Medical History:  Diagnosis Date  . Allergic rhinitis, cause unspecified   . Arthritis   . Bronchitis    h/o  . Cancer ([place])   . Cataract   . Fibromyalgia   . HLD (hyperlipidemia)   . Hypothyroidism   . 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 Other abnormal glucose   . 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 Personal history of colonic polyps   . Pre-diabetes   . Shingles   . Swelling of limb   . Thyroid goiter   . Tobacco use disorder   . Unspecified sinusitis (chronic)     Past Surgical History:  Procedure Laterality Date  . APPENDECTOMY  06/1997  . BREAST BIOPSY Right 2002    core with clip  . BREAST BIOPSY Right 2011  . BREAST EXCISIONAL BIOPSY Right 12/23/2017   right breast lumpetcomy  . COLONOSCOPY  10/15/2012   mutliple polyps.  Outlaw in [place].  Repeat 5 years.  . COLONOSCOPY W/ BIOPSIES  03/30/2004   Adenomatous polyp of the cecum, hyperplastic polyps of the splenic flexure, upper rectum and mid rectum.  . COLPOSCOPY  10/16/2011   HGSIL pap smear.  Negative.  Marland Kitchen EXTERNAL EAR SURGERY  09/2008   external - not specified  (RIGHT)  . MASTECTOMY, PARTIAL Right 12/23/2017   T1b, N0; 8 mm ER: 100%; PR: 100%; Her 2 neu not overexpressed.  Surgeon: Robert Bellow, MD;  Location: [facility] ORS;  Service: General;  Laterality: Right;  . SENTINEL NODE BIOPSY Right 12/23/2017   Procedure: SENTINEL NODE BIOPSY;  Surgeon: Robert Bellow, MD;  Location: [facility] ORS;  Service: General;  Laterality:  Right;    Family History  Problem Relation Age of Onset  . Heart failure Father   . Hypertension Father   . Stroke Father   . Hyperlipidemia Father   . Heart failure Brother   . Diabetes Brother   . Heart disease Brother   . Diabetes Brother   . Heart disease Brother   . Hypertension Sister   . Depression Sister   . Hypertension Sister   . Hyperlipidemia Sister   . Arthritis Unknown   . Coronary artery disease Unknown   . Hyperlipidemia Brother   . Hypertension Brother   . Breast cancer Neg Hx     Social History Social History   Tobacco Use  . Smoking status: Former Smoker    Packs/day: 1.00    Years: 40.00    Pack years: 40.00    Types: Cigarettes    Last attempt to quit: 04/02/2003    Years since quitting: 14.8  . Smokeless tobacco: Never Used  . Tobacco comment: 7-10 cigarettes for past 40 years   Substance Use Topics  . Alcohol use: Yes    Comment: occasional  5 drinks per year  . Drug use: No    No Known Allergies  Current Outpatient Medications  Medication Sig Dispense Refill  . Ascorbic Acid (VITAMIN C) 1000 MG tablet Take 1,000 mg  by mouth daily.    Marland Kitchen aspirin 81 MG tablet Take 81 mg by mouth daily.     Marland Kitchen atorvastatin (LIPITOR) 20 MG tablet TAKE ONE (1) TABLET BY MOUTH EVERY DAY (Patient taking differently: Take 20 mg by mouth daily at 6 PM. TAKE ONE (1) TABLET BY MOUTH EVERY DAY) 90 tablet 1  . cefadroxil (DURICEF) 500 MG capsule Take 1 capsule (500 mg total) by mouth 2 (two) times daily. Start taking this medication one hour before office procedure on 01-21-18 22 capsule 0  . cetirizine (ZYRTEC) 10 MG tablet Take 10 mg by mouth every morning.     . Cholecalciferol (VITAMIN D3) 2000 UNITS capsule Take 2,000 Units by mouth daily.      . Coenzyme Q10 (COQ-10) 200 MG CAPS Take 200 mg by mouth daily.    . fluticasone (FLONASE) 50 MCG/ACT nasal spray Place 2 sprays into both nostrils daily. (Patient taking differently: Place 2 sprays into both nostrils daily as  needed for allergies. ) 16 g 6  . furosemide (LASIX) 20 MG tablet Take 1 tablet (20 mg total) by mouth 2 (two) times daily. 180 tablet 1  . Glucosamine Sulfate 1000 MG CAPS Take 1,000 mg by mouth daily.     Marland Kitchen HYDROcodone-acetaminophen (NORCO/VICODIN) 5-325 MG tablet Take 1 tablet by mouth every 4 (four) hours as needed for moderate pain. 20 tablet 0  . levothyroxine (SYNTHROID, LEVOTHROID) 50 MCG tablet Take 1 tablet (50 mcg total) by mouth daily. (Patient taking differently: Take 50 mcg by mouth daily before breakfast. ) 90 tablet 1  . Misc Natural Products (OSTEO BI-FLEX JOINT SHIELD) TABS Take 2 tablets by mouth daily. With magnesium     . Multiple Vitamin (MULTIVITAMIN) tablet Take 1 tablet by mouth daily.    . Olopatadine HCl (PATADAY OP) Place 1 drop into both eyes daily as needed (allergies).    . Polyvinyl Alcohol-Povidone (REFRESH OP) Place 1 drop into both eyes daily as needed (dry eyes).    Marland Kitchen rOPINIRole (REQUIP) 0.5 MG tablet TAKE 1 TABLET AT BEDTIME 90 tablet 1  . senna (SENOKOT) 8.6 MG tablet Take 1-6 tablets by mouth at bedtime.      No current facility-administered medications for this visit.     Review of Systems Review of Systems  Constitutional: Negative.   Respiratory: Negative.   Cardiovascular: Negative.     Blood pressure 124/74, pulse 84, resp. rate 18, height 5\' 4"  (1.626 m), weight 216 lb (98 kg), SpO2 96 %.  Physical Exam Physical Exam  Constitutional: She is oriented to person, place, and time. She appears well-developed and well-nourished.  Pulmonary/Chest:    Neurological: She is alert and oriented to person, place, and time.  Skin: Skin is warm and dry.  Psychiatric: Her behavior is normal.    Data Reviewed Ultrasound examination showed a small seroma cavity adequately spaced from the overlying skin.  10 cc of 0.5% Xylocaine with 0.25% Marcaine with 1-200,000 units of epinephrine was utilized and well-tolerated.  ChloraPrep was applied to the skin.  A  small 8 mm skin incision was made and the 8 mm trocar advanced into the seroma cavity.  The cavity evaluation device was placed and inflated with 45 cc of saline without incident.  This showed adequate spacing at 1.25 cm from the overlying skin.  The cavity evaluation device was removed and the treatment balloon placed.  This was inflated with 50 cc of a mix of saline and Omnipaque.  Spherical insufflation was noted  both prior to and after insertion of the patient.  Minimal spacing was 1.38 cm to the overlying skin.  Bacitracin was applied to the catheter exit site followed by a bulky dry dressing.  The procedure was well-tolerated.  Assessment    Candidate for accelerated partial breast radiation.    Plan    The patient will continue her antibiotics as previously instructed.  She will present to the cancer center for simulation on Thursday, April 11.  Follow up in 2 weeks     HPI, Physical Exam, Assessment and Plan have been scribed under the direction and in the presence of Robert Bellow, MD. Karie Fetch, RN  I have completed the exam and reviewed the above documentation for accuracy and completeness.  I agree with the above.  Haematologist has been used and any errors in dictation or transcription are unintentional.  Hervey Ard, M.D., F.A.C.S.   Forest Gleason [place] 01/22/2018, 5:31 PM

## 2018-01-21 NOTE — Patient Instructions (Signed)
Patient care kit given to patient.  Instructed no showers, sponge bath while mammosite in place, take antibiotic. Follow up with Cancer Center as arranged. Discussed wearing your bra for support at all times. 

## 2018-01-23 ENCOUNTER — Ambulatory Visit
Admission: RE | Admit: 2018-01-23 | Discharge: 2018-01-23 | Disposition: A | Discharge status: Home / Self Care | Payer: Medicare Other | Source: Ambulatory Visit | Attending: Radiation Oncology | Admitting: Radiation Oncology

## 2018-01-23 DIAGNOSIS — C50411 Malignant neoplasm of upper-outer quadrant of right female breast: Secondary | ICD-10-CM | POA: Diagnosis not present

## 2018-01-23 DIAGNOSIS — Z510 Encounter for antineoplastic radiation therapy: Secondary | ICD-10-CM | POA: Insufficient documentation

## 2018-01-23 DIAGNOSIS — Z170 Estrogen receptor positive status [ER+]: Secondary | ICD-10-CM | POA: Insufficient documentation

## 2018-01-27 ENCOUNTER — Ambulatory Visit
Admission: RE | Admit: 2018-01-27 | Discharge: 2018-01-27 | Disposition: A | Discharge status: Home / Self Care | Payer: Medicare Other | Source: Ambulatory Visit | Attending: Radiation Oncology | Admitting: Radiation Oncology

## 2018-01-27 DIAGNOSIS — C50411 Malignant neoplasm of upper-outer quadrant of right female breast: Secondary | ICD-10-CM | POA: Diagnosis not present

## 2018-01-28 ENCOUNTER — Ambulatory Visit
Admission: RE | Admit: 2018-01-28 | Discharge: 2018-01-28 | Disposition: A | Discharge status: Home / Self Care | Payer: Medicare Other | Source: Ambulatory Visit | Attending: Radiation Oncology | Admitting: Radiation Oncology

## 2018-01-28 DIAGNOSIS — C50411 Malignant neoplasm of upper-outer quadrant of right female breast: Secondary | ICD-10-CM | POA: Diagnosis not present

## 2018-01-29 ENCOUNTER — Ambulatory Visit
Admission: RE | Admit: 2018-01-29 | Discharge: 2018-01-29 | Disposition: A | Discharge status: Home / Self Care | Payer: Medicare Other | Source: Ambulatory Visit | Attending: Radiation Oncology | Admitting: Radiation Oncology

## 2018-01-29 DIAGNOSIS — C50411 Malignant neoplasm of upper-outer quadrant of right female breast: Secondary | ICD-10-CM | POA: Diagnosis not present

## 2018-01-30 ENCOUNTER — Encounter: Payer: Self-pay | Admitting: General Surgery

## 2018-01-30 ENCOUNTER — Ambulatory Visit
Admission: RE | Admit: 2018-01-30 | Discharge: 2018-01-30 | Disposition: A | Discharge status: Home / Self Care | Payer: Medicare Other | Source: Ambulatory Visit | Attending: Radiation Oncology | Admitting: Radiation Oncology

## 2018-01-30 DIAGNOSIS — C50411 Malignant neoplasm of upper-outer quadrant of right female breast: Secondary | ICD-10-CM | POA: Diagnosis not present

## 2018-01-31 ENCOUNTER — Ambulatory Visit
Admission: RE | Admit: 2018-01-31 | Discharge: 2018-01-31 | Disposition: A | Discharge status: Home / Self Care | Payer: Medicare Other | Source: Ambulatory Visit | Attending: Radiation Oncology | Admitting: Radiation Oncology

## 2018-01-31 DIAGNOSIS — C50411 Malignant neoplasm of upper-outer quadrant of right female breast: Secondary | ICD-10-CM | POA: Diagnosis not present

## 2018-02-02 ENCOUNTER — Other Ambulatory Visit: Payer: Self-pay | Admitting: Family Medicine

## 2018-02-02 DIAGNOSIS — I878 Other specified disorders of veins: Secondary | ICD-10-CM

## 2018-02-03 NOTE — Telephone Encounter (Signed)
Refill not appropriate at this time. Prescription written on 11/20/17 #90 with 1 refill

## 2018-02-04 ENCOUNTER — Encounter: Payer: Self-pay | Admitting: General Surgery

## 2018-02-04 ENCOUNTER — Other Ambulatory Visit: Payer: Self-pay | Admitting: Internal Medicine

## 2018-02-04 ENCOUNTER — Ambulatory Visit (INDEPENDENT_AMBULATORY_CARE_PROVIDER_SITE_OTHER): Payer: Medicare Other | Admitting: General Surgery

## 2018-02-04 VITALS — BP 132/82 | HR 77 | Resp 14 | Ht 64.0 in | Wt 218.0 lb

## 2018-02-04 DIAGNOSIS — Z780 Asymptomatic menopausal state: Secondary | ICD-10-CM

## 2018-02-04 DIAGNOSIS — N6321 Unspecified lump in the left breast, upper outer quadrant: Secondary | ICD-10-CM | POA: Insufficient documentation

## 2018-02-04 DIAGNOSIS — Z170 Estrogen receptor positive status [ER+]: Secondary | ICD-10-CM

## 2018-02-04 DIAGNOSIS — C50411 Malignant neoplasm of upper-outer quadrant of right female breast: Secondary | ICD-10-CM

## 2018-02-04 DIAGNOSIS — E7800 Pure hypercholesterolemia, unspecified: Secondary | ICD-10-CM

## 2018-02-04 MED ORDER — LETROZOLE 2.5 MG PO TABS: 2.5000 mg | ORAL_TABLET | Freq: Every day | ORAL | 12 refills | Status: DC

## 2018-02-04 NOTE — Patient Instructions (Addendum)
The patient is aware to call back for any questions or new concerns.  Follow up in one month with a left breast mass biopsy

## 2018-02-04 NOTE — Progress Notes (Signed)
Patient ID: Carol [name], female   DOB: Sep 12, 1946, 72 y.o.   MRN: [id number]  Chief Complaint  Patient presents with  . Follow-up    HPI Carol [name] is a 72 y.o. female.  Here for post mammosite radiation. She states it went well. She is here with her husband, [name]. HPI  Past Medical History:  Diagnosis Date  . Allergic rhinitis, cause unspecified   . Arthritis   . Bronchitis    h/o  . Cancer ([place])   . Cataract   . Fibromyalgia   . HLD (hyperlipidemia)   . Hypothyroidism   . 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 Other abnormal glucose   . 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 Personal history of colonic polyps   . Pre-diabetes   . Shingles   . Swelling of limb   . Thyroid goiter   . Tobacco use disorder   . Unspecified sinusitis (chronic)     Past Surgical History:  Procedure Laterality Date  . APPENDECTOMY  06/1997  . BREAST BIOPSY Right 2002    core with clip  . BREAST BIOPSY Right 2011  . BREAST EXCISIONAL BIOPSY Right 12/23/2017   right breast lumpetcomy  . COLONOSCOPY  10/15/2012   mutliple polyps.  Outlaw in [place].  Repeat 5 years.  . COLONOSCOPY W/ BIOPSIES  03/30/2004   Adenomatous polyp of the cecum, hyperplastic polyps of the splenic flexure, upper rectum and mid rectum.  . COLPOSCOPY  10/16/2011   HGSIL pap smear.  Negative.  Marland Kitchen EXTERNAL EAR SURGERY  09/2008   external - not specified  (RIGHT)  . MASTECTOMY, PARTIAL Right 12/23/2017   T1b, N0; 8 mm ER: 100%; PR: 100%; Her 2 neu not overexpressed.  Surgeon: Robert Bellow, MD;  Location: [facility] OR[facility];  Service: General;  Laterality: Right;  . SENTINEL NODE BIOPSY Right 12/23/2017   Procedure: SENTINEL NODE BIOPSY;  Surgeon: Robert Bellow, MD;  Location: [facility];  Service: General;  Laterality: Right;    Family History  Problem Relation Age of Onset  . Heart failure Father   . Hypertension Father   . Stroke Father   . Hyperlipidemia  Father   . Heart failure Brother   . Diabetes Brother   . Heart disease Brother   . Diabetes Brother   . Heart disease Brother   . Hypertension Sister   . Depression Sister   . Hypertension Sister   . Hyperlipidemia Sister   . Arthritis Unknown   . Coronary artery disease Unknown   . Hyperlipidemia Brother   . Hypertension Brother   . Breast cancer Neg Hx     Social History Social History   Tobacco Use  . Smoking status: Former Smoker    Packs/day: 1.00    Years: 40.00    Pack years: 40.00    Types: Cigarettes    Last attempt to quit: 04/02/2003    Years since quitting: 14.8  . Smokeless tobacco: Never Used  . Tobacco comment: 7-10 cigarettes for past 40 years   Substance Use Topics  . Alcohol use: Yes    Comment: occasional  5 drinks per year  . Drug use: No    No Known Allergies  Current Outpatient Medications  Medication Sig Dispense Refill  . Ascorbic Acid (VITAMIN C) 1000 MG tablet Take 1,000 mg by mouth daily.    Marland Kitchen aspirin 81 MG tablet Take 81 mg by mouth daily.     Marland Kitchen  cefadroxil (DURICEF) 500 MG capsule Take 1 capsule (500 mg total) by mouth 2 (two) times daily. Start taking this medication one hour before office procedure on 01-21-18 22 capsule 0  . cetirizine (ZYRTEC) 10 MG tablet Take 10 mg by mouth every morning.     . Cholecalciferol (VITAMIN D3) 2000 UNITS capsule Take 2,000 Units by mouth daily.      . Coenzyme Q10 (COQ-10) 200 MG CAPS Take 200 mg by mouth daily.    . fluticasone (FLONASE) 50 MCG/ACT nasal spray Place 2 sprays into both nostrils daily. (Patient taking differently: Place 2 sprays into both nostrils daily as needed for allergies. ) 16 g 6  . furosemide (LASIX) 20 MG tablet Take 1 tablet (20 mg total) by mouth 2 (two) times daily. 180 tablet 1  . Glucosamine Sulfate 1000 MG CAPS Take 1,000 mg by mouth daily.     Marland Kitchen HYDROcodone-acetaminophen (NORCO/VICODIN) 5-325 MG tablet Take 1 tablet by mouth every 4 (four) hours as needed for moderate pain.  20 tablet 0  . levothyroxine (SYNTHROID, LEVOTHROID) 50 MCG tablet Take 1 tablet (50 mcg total) by mouth daily. (Patient taking differently: Take 50 mcg by mouth daily before breakfast. ) 90 tablet 1  . Misc Natural Products (OSTEO BI-FLEX JOINT SHIELD) TABS Take 2 tablets by mouth daily. With magnesium     . Multiple Vitamin (MULTIVITAMIN) tablet Take 1 tablet by mouth daily.    . Olopatadine HCl (PATADAY OP) Place 1 drop into both eyes daily as needed (allergies).    . Polyvinyl Alcohol-Povidone (REFRESH OP) Place 1 drop into both eyes daily as needed (dry eyes).    Marland Kitchen rOPINIRole (REQUIP) 0.5 MG tablet TAKE 1 TABLET AT BEDTIME 90 tablet 1  . senna (SENOKOT) 8.6 MG tablet Take 1-6 tablets by mouth at bedtime.     Marland Kitchen atorvastatin (LIPITOR) 20 MG tablet TAKE 1 TABLET DAILY 90 tablet 1  . letrozole (FEMARA) 2.5 MG tablet Take 1 tablet (2.5 mg total) by mouth daily. 30 tablet 12   No current facility-administered medications for this visit.     Review of Systems Review of Systems  Constitutional: Negative.   Respiratory: Negative.   Cardiovascular: Negative.     Blood pressure 132/82, pulse 77, resp. rate 14, height 5\' 4"  (1.626 m), weight 218 lb (98.9 kg), SpO2 98 %.  Physical Exam Physical Exam  Constitutional: She is oriented to person, place, and time. She appears well-developed and well-nourished.  Pulmonary/Chest:    Well healed right breast incisions   Neurological: She is alert and oriented to person, place, and time.  Skin: Skin is warm and dry.  Psychiatric: Her behavior is normal.    Data Reviewed November 01, 2017 left ultrasound: Ultrasound of the left breast demonstrate a minimal complicated cyst at the left breast 12:30 o'clock 7 cm from nipple measuring 0.4 cm. There is a normal intramammary lymph node at the left breast 2:30 o'clock 5 cm from nipple measuring 0.6 cm. One of these findings may correlate to the mammographic finding. No other focal discrete cystic  or solid lesion is identified in the upper-outer quadrant left breast. Ultrasound of the left axilla is negative. Case presented at [facility] tumor board. Suggestion to assess left breast lesion by FNA/ Biopsy based on recent right breast cancer diagnosis.   Assessment    Doing well post wide excision.      Plan    Discussed letrozole therapy in detail. Opportunity for medical oncology evaluation reviewed.  Follow up in one month with a left breast mass biopsy and to report tolerance of AI therapy.  Bone density due now.    HPI, Physical Exam, Assessment and Plan have been scribed under the direction and in the presence of Robert Bellow, MD. Karie Fetch, RN  I have completed the exam and reviewed the above documentation for accuracy and completeness.  I agree with the above.  Haematologist has been used and any errors in dictation or transcription are unintentional.  Hervey Ard, M.D., F.A.C.S.  Forest Gleason Rashan Rounsaville 02/04/2018, 9:39 PM

## 2018-02-05 ENCOUNTER — Ambulatory Visit
Admission: RE | Admit: 2018-02-05 | Discharge: 2018-02-05 | Disposition: A | Discharge status: Home / Self Care | Payer: Medicare Other | Source: Ambulatory Visit | Attending: General Surgery | Admitting: General Surgery

## 2018-02-05 DIAGNOSIS — M8588 Other specified disorders of bone density and structure, other site: Secondary | ICD-10-CM | POA: Diagnosis not present

## 2018-02-05 DIAGNOSIS — C50411 Malignant neoplasm of upper-outer quadrant of right female breast: Secondary | ICD-10-CM | POA: Diagnosis present

## 2018-02-05 DIAGNOSIS — Z780 Asymptomatic menopausal state: Secondary | ICD-10-CM | POA: Diagnosis present

## 2018-02-05 DIAGNOSIS — Z170 Estrogen receptor positive status [ER+]: Secondary | ICD-10-CM | POA: Diagnosis present

## 2018-02-05 HISTORY — DX: Personal history of irradiation: Z92.3

## 2018-02-05 HISTORY — DX: Malignant neoplasm of unspecified site of unspecified female breast: C50.919

## 2018-02-13 ENCOUNTER — Encounter: Payer: Self-pay | Admitting: Internal Medicine

## 2018-02-13 ENCOUNTER — Telehealth: Payer: Self-pay | Admitting: Internal Medicine

## 2018-02-13 DIAGNOSIS — M8580 Other specified disorders of bone density and structure, unspecified site: Secondary | ICD-10-CM | POA: Insufficient documentation

## 2018-02-13 NOTE — Telephone Encounter (Signed)
I heard from Dr Bary Castilla and he agrees with offering patient  Prolia to protect her bones from the cancer treatment.  .  If patient wants to have appt to discuss please  arrange.

## 2018-02-13 NOTE — Telephone Encounter (Signed)
Patient scheduled to come into office to discuss Prolia on 02/19/18. FYI

## 2018-02-19 ENCOUNTER — Encounter: Payer: Self-pay | Admitting: Internal Medicine

## 2018-02-19 ENCOUNTER — Ambulatory Visit: Payer: Medicare Other | Admitting: Internal Medicine

## 2018-02-19 DIAGNOSIS — M85852 Other specified disorders of bone density and structure, left thigh: Secondary | ICD-10-CM

## 2018-02-19 DIAGNOSIS — Z6837 Body mass index (BMI) 37.0-37.9, adult: Secondary | ICD-10-CM | POA: Diagnosis not present

## 2018-02-19 DIAGNOSIS — F409 Phobic anxiety disorder, unspecified: Secondary | ICD-10-CM

## 2018-02-19 DIAGNOSIS — F5105 Insomnia due to other mental disorder: Secondary | ICD-10-CM | POA: Diagnosis not present

## 2018-02-19 NOTE — Patient Instructions (Addendum)
For your insomnia:  I recommend a trial of  Melatonin.  Take it every night  after  Dinner ,  or if you can find the spray that goes under the tongue  on [employer]   Denosumab injection What is this medicine? DENOSUMAB (den oh sue mab) slows bone breakdown. Prolia is used to treat osteoporosis in women after menopause and in men. Delton See is used to treat a high calcium level due to cancer and to prevent bone fractures and other bone problems caused by multiple myeloma or cancer bone metastases. Delton See is also used to treat giant cell tumor of the bone. This medicine may be used for other purposes; ask your health care provider or pharmacist if you have questions. COMMON BRAND NAME(S): Prolia, XGEVA What should I tell my health care provider before I take this medicine? They need to know if you have any of these conditions: -dental disease -having surgery or tooth extraction -infection -kidney disease -low levels of calcium or Vitamin D in the blood -malnutrition -on hemodialysis -skin conditions or sensitivity -thyroid or parathyroid disease -an unusual reaction to denosumab, other medicines, foods, dyes, or preservatives -pregnant or trying to get pregnant -breast-feeding How should I use this medicine? This medicine is for injection under the skin. It is given by a health care professional in a hospital or clinic setting. If you are getting Prolia, a special MedGuide will be given to you by the pharmacist with each prescription and refill. Be sure to read this information carefully each time. For Prolia, talk to your pediatrician regarding the use of this medicine in children. Special care may be needed. For Delton See, talk to your pediatrician regarding the use of this medicine in children. While this drug may be prescribed for children as young as 13 years for selected conditions, precautions do apply. Overdosage: If you think you have taken too much of this medicine contact a poison control  center or emergency room at once. NOTE: This medicine is only for you. Do not share this medicine with others. What if I miss a dose? It is important not to miss your dose. Call your doctor or health care professional if you are unable to keep an appointment. What may interact with this medicine? Do not take this medicine with any of the following medications: -other medicines containing denosumab This medicine may also interact with the following medications: -medicines that lower your chance of fighting infection -steroid medicines like prednisone or cortisone This list may not describe all possible interactions. Give your health care provider a list of all the medicines, herbs, non-prescription drugs, or dietary supplements you use. Also tell them if you smoke, drink alcohol, or use illegal drugs. Some items may interact with your medicine. What should I watch for while using this medicine? Visit your doctor or health care professional for regular checks on your progress. Your doctor or health care professional may order blood tests and other tests to see how you are doing. Call your doctor or health care professional for advice if you get a fever, chills or sore throat, or other symptoms of a cold or flu. Do not treat yourself. This drug may decrease your body's ability to fight infection. Try to avoid being around people who are sick. You should make sure you get enough calcium and vitamin D while you are taking this medicine, unless your doctor tells you not to. Discuss the foods you eat and the vitamins you take with your health care professional. See  your dentist regularly. Brush and floss your teeth as directed. Before you have any dental work done, tell your dentist you are receiving this medicine. Do not become pregnant while taking this medicine or for 5 months after stopping it. Talk with your doctor or health care professional about your birth control options while taking this medicine.  Women should inform their doctor if they wish to become pregnant or think they might be pregnant. There is a potential for serious side effects to an unborn child. Talk to your health care professional or pharmacist for more information. What side effects may I notice from receiving this medicine? Side effects that you should report to your doctor or health care professional as soon as possible: -allergic reactions like skin rash, itching or hives, swelling of the face, lips, or tongue -bone pain -breathing problems -dizziness -jaw pain, especially after dental work -redness, blistering, peeling of the skin -signs and symptoms of infection like fever or chills; cough; sore throat; pain or trouble passing urine -signs of low calcium like fast heartbeat, muscle cramps or muscle pain; pain, tingling, numbness in the hands or feet; seizures -unusual bleeding or bruising -unusually weak or tired Side effects that usually do not require medical attention (report to your doctor or health care professional if they continue or are bothersome): -constipation -diarrhea -headache -joint pain -loss of appetite -muscle pain -runny nose -tiredness -upset stomach This list may not describe all possible side effects. Call your doctor for medical advice about side effects. You may report side effects to FDA at 1-800-FDA-1088. Where should I keep my medicine? This medicine is only given in a clinic, doctor's office, or other health care setting and will not be stored at home. NOTE: This sheet is a summary. It may not cover all possible information. If you have questions about this medicine, talk to your doctor, pharmacist, or health care provider.  2018 [employer]vier/Gold Standard (2016-10-23 19:17:21)

## 2018-02-19 NOTE — Progress Notes (Signed)
Subjective:  Patient ID: Carol [name], female    DOB: Jun 29, 1946  Age: 72 y.o. MRN: [id number]  CC: Diagnoses of Osteopenia of neck of left femur, Insomnia due to anxiety and fear, and Class 2 severe obesity due to excess calories with serious comorbidity and body mass index (BMI) of 37.0 to 37.9 in adult [facility]) were pertinent to this visit.  HPI Carol [name] presents for discussion of the risks and benefits of  Starting denosumab (Prolia ) to manage osteopenia given plans to start adjuvant  Endocrine  therapy for new diagnosis of breast cancer.  She has begun Letrazole on  April 23 and is tolerating the medication despite frequent g hot flashes since starting Letrozole.    DEXA scan  done prior to initiation of therapy noted osteopenia  with T score of -2.1 in the left femur   Potential risks and benefits discussed   2) altered sleep rhythms ,  Chronic  For years.  .  Stopped taking zoloft years ago since it did not help.  No prior trial of melatonin  3) sees a chiropractor for chronic adjustment of cervical spine .    4) prediabetes : not new.  Sh eis  not intentionally trying to lose weight or change diet given recent diagnosis of breast cancer      Outpatient Medications Prior to Visit  Medication Sig Dispense Refill  . Ascorbic Acid (VITAMIN C) 1000 MG tablet Take 1,000 mg by mouth daily.    Marland Kitchen aspirin 81 MG tablet Take 81 mg by mouth daily.     Marland Kitchen atorvastatin (LIPITOR) 20 MG tablet TAKE 1 TABLET DAILY 90 tablet 1  . cefadroxil (DURICEF) 500 MG capsule Take 1 capsule (500 mg total) by mouth 2 (two) times daily. Start taking this medication one hour before office procedure on 01-21-18 22 capsule 0  . cetirizine (ZYRTEC) 10 MG tablet Take 10 mg by mouth every morning.     . Cholecalciferol (VITAMIN D3) 2000 UNITS capsule Take 2,000 Units by mouth daily.      . Coenzyme Q10 (COQ-10) 200 MG CAPS Take 200 mg by mouth daily.    . fluticasone (FLONASE) 50 MCG/ACT nasal  spray Place 2 sprays into both nostrils daily. (Patient taking differently: Place 2 sprays into both nostrils daily as needed for allergies. ) 16 g 6  . furosemide (LASIX) 20 MG tablet Take 1 tablet (20 mg total) by mouth 2 (two) times daily. 180 tablet 1  . Glucosamine Sulfate 1000 MG CAPS Take 1,000 mg by mouth daily.     Marland Kitchen HYDROcodone-acetaminophen (NORCO/VICODIN) 5-325 MG tablet Take 1 tablet by mouth every 4 (four) hours as needed for moderate pain. 20 tablet 0  . letrozole (FEMARA) 2.5 MG tablet Take 1 tablet (2.5 mg total) by mouth daily. 30 tablet 12  . levothyroxine (SYNTHROID, LEVOTHROID) 50 MCG tablet Take 1 tablet (50 mcg total) by mouth daily. (Patient taking differently: Take 50 mcg by mouth daily before breakfast. ) 90 tablet 1  . Misc Natural Products (OSTEO BI-FLEX JOINT SHIELD) TABS Take 2 tablets by mouth daily. With magnesium     . Multiple Vitamin (MULTIVITAMIN) tablet Take 1 tablet by mouth daily.    . Olopatadine HCl (PATADAY OP) Place 1 drop into both eyes daily as needed (allergies).    . Polyvinyl Alcohol-Povidone (REFRESH OP) Place 1 drop into both eyes daily as needed (dry eyes).    Marland Kitchen rOPINIRole (REQUIP) 0.5 MG tablet TAKE 1 TABLET AT  BEDTIME 90 tablet 1  . senna (SENOKOT) 8.6 MG tablet Take 1-6 tablets by mouth at bedtime.      No facility-administered medications prior to visit.     Review of Systems;  Patient denies headache, fevers, malaise, unintentional weight loss, skin rash, eye pain, sinus congestion and sinus pain, sore throat, dysphagia,  hemoptysis , cough, dyspnea, wheezing, chest pain, palpitations, orthopnea, edema, abdominal pain, nausea, melena, diarrhea, constipation, flank pain, dysuria, hematuria, urinary  Frequency, nocturia, numbness, tingling, seizures,  Focal weakness, Loss of consciousness,  Tremor, insomnia, depression, anxiety, and suicidal ideation.      Objective:  BP 116/78 (BP Location: Left Arm, Patient Position: Sitting, Cuff Size:  Large)   Pulse 72   Temp 98.4 F (36.9 C) (Oral)   Resp 15   Ht 5\' 4"  (1.626 m)   Wt 216 lb (98 kg)   SpO2 95%   BMI 37.08 kg/m   BP Readings from Last 3 Encounters:  02/19/18 116/78  02/04/18 132/82  01/21/18 124/74    Wt Readings from Last 3 Encounters:  02/19/18 216 lb (98 kg)  02/04/18 218 lb (98.9 kg)  01/21/18 216 lb (98 kg)    General appearance: alert, cooperative and appears stated age Ears: normal TM's and external ear canals both ears Throat: lips, mucosa, and tongue normal; teeth and gums normal Neck: no adenopathy, no carotid bruit, supple, symmetrical, trachea midline and thyroid not enlarged, symmetric, no tenderness/mass/nodules Back: symmetric, no curvature. ROM normal. No CVA tenderness. Lungs: clear to auscultation bilaterally Heart: regular rate and rhythm, S1, S2 normal, no murmur, click, rub or gallop Abdomen: soft, non-tender; bowel sounds normal; no masses,  no organomegaly Pulses: 2+ and symmetric Skin: Skin color, texture, turgor normal. No rashes or lesions Lymph nodes: Cervical, supraclavicular, and axillary nodes normal.  Lab Results  Component Value Date   HGBA1C 6.3 10/02/2017   HGBA1C 6.0 (H) 03/06/2017   HGBA1C 5.7 (H) 07/06/2016    Lab Results  Component Value Date   CREATININE 0.97 10/02/2017   CREATININE 0.98 03/06/2017   CREATININE 1.05 (H) 07/06/2016    Lab Results  Component Value Date   WBC 6.3 03/06/2017   HGB 13.3 03/06/2017   HCT 38.9 03/06/2017   PLT 212 03/06/2017   GLUCOSE 137 (H) 10/02/2017   CHOL 132 10/02/2017   TRIG 162.0 (H) 10/02/2017   HDL 48.70 10/02/2017   LDLCALC 51 10/02/2017   ALT 44 (H) 11/18/2017   AST 30 11/18/2017   NA 142 10/02/2017   K 3.9 12/18/2017   CL 103 10/02/2017   CREATININE 0.97 10/02/2017   BUN 8 10/02/2017   CO2 32 10/02/2017   TSH 3.82 11/18/2017   HGBA1C 6.3 10/02/2017   MICROALBUR 1.9 11/18/2017    Dg Bone Density  Result Date: 02/05/2018 EXAM: DUAL X-RAY  ABSORPTIOMETRY (DXA) FOR BONE MINERAL DENSITY IMPRESSION: Dear Dr. Bary Castilla, Your patient Carol [name] completed a FRAX assessment on 02/05/2018 using the [place] (analysis version: 14.10) manufactured by [employer]. The following summarizes the results of our evaluation. PATIENT BIOGRAPHICAL: Name: Bentley, Haralson Patient ID: [id number] Birth Date: [birth year] Height:    61.5 in. Gender:     Female    Age:        71.8       Weight:    216.0 lbs. Ethnicity:  White  Exam Date: 02/05/2018 FRAX* RESULTS:  (version: 3.5) 10-year Probability of Fracture1 Major Osteoporotic Fracture2 Hip Fracture 12.0% 3.8% Population: [place] (Caucasian) Risk Factors: Tobacco User (Current Smoker) Based on Femur (Left) Neck BMD 1 -The 10-year probability of fracture may be lower than reported if the patient has received treatment. 2 -Major Osteoporotic Fracture: Clinical Spine, Forearm, Hip or Shoulder *FRAX is a Materials engineer of the [employer] for Metabolic Bone Disease, a [place] (WHO) [facility]. ASSESSMENT: The probability of a major osteoporotic fracture is 12.0% within the next ten years. The probability of a hip fracture is 3.8% within the next ten years. . Dear Dr. Bary Castilla, Your patient Kizzie Cotten completed a BMD test on 02/05/2018 using the [place] (analysis version: 14.10) manufactured by [employer]. The following summarizes the results of our evaluation. PATIENT BIOGRAPHICAL: Name: Tyger, Oka Patient ID: [id number] Birth Date: 1946/04/20 Height: 61.5 in. Gender: Female Exam Date: 02/05/2018 Weight: 216.0 lbs. Indications: Advanced Age, Caucasian, Family Hx of Osteoporosis, Height Loss, High Risk Meds, History of Breast Cancer, History of Radiation, Postmenopausal, Tobacco User (Current Smoker) Fractures: Treatments: Calcium, Letrozole, Vit D ASSESSMENT: The BMD measured at Femur Neck Left is  0.742 g/cm2 with a T-score of -2.1. This patient is considered osteopenic according to [facility]) criteria. L3 and L4 were excluded due to degenerative changes. Site Region Measured Measured WHO Young Adult BMD Date       Age      Classification T-score AP Spine L1-L2 02/05/2018 70.8 Osteopenia -1.6 0.980 g/cm2 AP Spine L1-L2 02/01/2016 68.8 Osteopenia -1.6 0.982 g/cm2 DualFemur Neck Left 02/05/2018 70.8 Osteopenia -2.1 0.742 g/cm2 DualFemur Neck Left 02/01/2016 68.8 Osteopenia -2.0 0.754 g/cm2 DualFemur Total Mean 02/05/2018 70.8 Normal -1.0 0.884 g/cm2 DualFemur Total Mean 02/01/2016 68.8 Normal -0.8 0.902 g/cm2 World Health Organization [facility]) criteria for post-menopausal, Caucasian Women: Normal:       T-score at or above -1 SD Osteopenia:   T-score between -1 and -2.5 SD Osteoporosis: T-score at or below -2.5 SD RECOMMENDATIONS: 1. All patients should optimize calcium and vitamin D intake. 2. Consider FDA-approved medical therapies in postmenopausal women and men aged 65 years and older, based on the following: a. A hip or vertebral(clinical or morphometric) fracture b. T-score < -2.5 at the femoral neck or spine after appropriate evaluation to exclude secondary causes c. Low bone mass (T-score between -1.0 and -2.5 at the femoral neck or spine) and a 10-year probability of a hip fracture > 3% or a 10-year probability of a major osteoporosis-related fracture > 20% based on the [place]-adapted WHO algorithm d. Clinician judgment and/or patient preferences may indicate treatment for people with 10-year fracture probabilities above or below these levels FOLLOW-UP: People with diagnosed cases of osteoporosis or at high risk for fracture should have regular bone mineral density tests. For patients eligible for Medicare, routine testing is allowed once every 2 years. The testing frequency can be increased to one year for patients who have rapidly progressing disease, those who are receiving or  discontinuing medical therapy to restore bone mass, or have additional risk factors. I have reviewed this report, and agree with the above findings. [facility] Electronically Signed   By: Lowella Grip III M.D.   On: 02/05/2018 15:00    Assessment & Plan:   Problem List Items Addressed This Visit    Osteopenia    She has moderate osteopenia ( T score -2.1) prior to Letrozole.  Therapy  wit,  Prolia advised and accepted , pending insurance coverage,  Will consider alendronate if not covered.       Obesity    With prediabetes noted. I have addressed  BMI and recommended wt loss of 10% of body weigh over the next 6 months using a low glycemic index diet and regular exercise a minimum of 5 days per week.        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 Discussed natural remedies for insomnia including herbal tea and melatonin.  Reviewd principles of good sleep hygiene       A total of 40 minutes of face to face time was spent with patient more than half of which was spent in counselling and coordination of care   I am having Carol [name] maintain her Glucosamine Sulfate, OSTEO BI-FLEX JOINT SHIELD, Vitamin D3, multivitamin, fluticasone, aspirin, furosemide, levothyroxine, rOPINIRole, cetirizine, vitamin C, CoQ-10, senna, Olopatadine HCl (PATADAY OP), Polyvinyl Alcohol-Povidone (REFRESH OP), HYDROcodone-acetaminophen, cefadroxil, letrozole, and atorvastatin.  No orders of the defined types were placed in this encounter.   There are no discontinued medications.  Follow-up: No follow-ups on file.   Crecencio Mc, MD

## 2018-02-22 ENCOUNTER — Encounter: Payer: Self-pay | Admitting: Internal Medicine

## 2018-02-22 DIAGNOSIS — F5105 Insomnia due to other mental disorder: Secondary | ICD-10-CM

## 2018-02-22 DIAGNOSIS — F409 Phobic anxiety disorder, unspecified: Secondary | ICD-10-CM | POA: Insufficient documentation

## 2018-02-22 NOTE — Assessment & Plan Note (Signed)
She has moderate osteopenia ( T score -2.1) prior to Letrozole.  Therapy wit,  Prolia advised and accepted , pending insurance coverage,  Will consider alendronate if not covered.

## 2018-02-22 NOTE — Assessment & Plan Note (Signed)
With prediabetes noted. I have addressed  BMI and recommended wt loss of 10% of body weigh over the next 6 months using a low glycemic index diet and regular exercise a minimum of 5 days per week.

## 2018-02-22 NOTE — Assessment & Plan Note (Signed)
Chronic for decades . Discussed natural remedies for insomnia including herbal tea and melatonin.  Reviewd principles of good sleep hygiene

## 2018-02-28 ENCOUNTER — Other Ambulatory Visit: Payer: Self-pay | Admitting: Internal Medicine

## 2018-02-28 DIAGNOSIS — I878 Other specified disorders of veins: Secondary | ICD-10-CM

## 2018-02-28 DIAGNOSIS — E7800 Pure hypercholesterolemia, unspecified: Secondary | ICD-10-CM

## 2018-02-28 DIAGNOSIS — G2581 Restless legs syndrome: Secondary | ICD-10-CM

## 2018-02-28 MED ORDER — ROPINIROLE HCL 0.5 MG PO TABS: 0.5000 mg | ORAL_TABLET | Freq: Every day | ORAL | 1 refills | Status: DC

## 2018-02-28 MED ORDER — FUROSEMIDE 20 MG PO TABS: 20.0000 mg | ORAL_TABLET | Freq: Two times a day (BID) | ORAL | 1 refills | Status: DC

## 2018-02-28 MED ORDER — ATORVASTATIN CALCIUM 20 MG PO TABS: ORAL_TABLET | ORAL | 1 refills | Status: DC

## 2018-02-28 MED ORDER — LEVOTHYROXINE SODIUM 50 MCG PO TABS: 50.0000 ug | ORAL_TABLET | Freq: Every day | ORAL | 1 refills | Status: DC

## 2018-02-28 NOTE — Telephone Encounter (Signed)
Copied from [place] [id number]. Topic: Quick Communication - Rx Refill/Question >> Feb 28, 2018  2:54 PM Synthia Innocent wrote: Medication: furosemide (LASIX) 20 MG tablet, letrozole (FEMARA) 2.5 MG tablet, levothyroxine (SYNTHROID, LEVOTHROID) 50 MCG tablet, atorvastatin (LIPITOR) 20 MG tablet, rOPINIRole (REQUIP) 0.5 MG tablet   Has the patient contacted their pharmacy? Yes.   (Agent: If no, request that the patient contact the pharmacy for the refill.) (Agent: If yes, when and what did the pharmacy advise?)[facility] states refills were not sent, provider had not ordered  Preferred Pharmacy (with phone number or street name): [facility]  Agent: Please be advised that RX refills may take up to 3 business days. We ask that you follow-up with your pharmacy.

## 2018-02-28 NOTE — Telephone Encounter (Signed)
Patient called and discussed the refill requests. I advised the Femara was filled by another provider and would need to be sent to that provider. She says "I will not see him on a regular basis, so if Dr. Derrel Nip can take over prescribing this for me, it would be better, since I have to be on this the rest of my life and it will need to go to [place]. I am changing to mail order because it is more convenient for me." I advised this would be sent to Dr. Derrel Nip for approval and the other medications will be sent in today to [facility], she verbalized understanding.  Letrozole refill Last OV:02/19/18 Last refill:02/04/18 30 tab/12 refills SWV:[id number] Pharmacy: [facility], [place] [phone] (Phone) [phone] (Fax)

## 2018-02-28 NOTE — Telephone Encounter (Signed)
See phone encounter below. Please advise

## 2018-03-03 ENCOUNTER — Ambulatory Visit: Payer: Medicare Other | Admitting: Radiation Oncology

## 2018-03-04 MED ORDER — LETROZOLE 2.5 MG PO TABS: 2.5000 mg | ORAL_TABLET | Freq: Every day | ORAL | 12 refills | Status: DC

## 2018-03-04 NOTE — Telephone Encounter (Signed)
Please advise on letrozole

## 2018-03-06 ENCOUNTER — Ambulatory Visit: Payer: Self-pay

## 2018-03-06 ENCOUNTER — Encounter: Payer: Self-pay | Admitting: General Surgery

## 2018-03-06 ENCOUNTER — Ambulatory Visit (INDEPENDENT_AMBULATORY_CARE_PROVIDER_SITE_OTHER): Payer: Medicare Other | Admitting: General Surgery

## 2018-03-06 VITALS — BP 152/88 | HR 79 | Resp 16 | Ht 64.0 in | Wt 216.0 lb

## 2018-03-06 DIAGNOSIS — Z86010 Personal history of colonic polyps: Secondary | ICD-10-CM

## 2018-03-06 DIAGNOSIS — N6321 Unspecified lump in the left breast, upper outer quadrant: Secondary | ICD-10-CM | POA: Diagnosis not present

## 2018-03-06 DIAGNOSIS — Z170 Estrogen receptor positive status [ER+]: Secondary | ICD-10-CM

## 2018-03-06 DIAGNOSIS — C50411 Malignant neoplasm of upper-outer quadrant of right female breast: Secondary | ICD-10-CM

## 2018-03-06 MED ORDER — POLYETHYLENE GLYCOL 3350 17 GM/SCOOP PO POWD: ORAL | 0 refills | Status: DC

## 2018-03-06 NOTE — Patient Instructions (Addendum)
The patient is aware to call back for any questions or concerns. The patient has been asked to return to the office in July with a left diagnostic mammogram The patient has been asked to return to the office in January 2020 one year with a bilateral diagnostic mammogram. Colonoscopy, Adult A colonoscopy is an exam to look at the entire large intestine. During the exam, a lubricated, bendable tube is inserted into the anus and then passed into the rectum, colon, and other parts of the large intestine. A colonoscopy is often done as a part of normal colorectal screening or in response to certain symptoms, such as anemia, persistent diarrhea, abdominal pain, and blood in the stool. The exam can help screen for and diagnose medical problems, including:  Tumors.  Polyps.  Inflammation.  Areas of bleeding.  Tell a health care provider about:  Any allergies you have.  All medicines you are taking, including vitamins, herbs, eye drops, creams, and over-the-counter medicines.  Any problems you or family members have had with anesthetic medicines.  Any blood disorders you have.  Any surgeries you have had.  Any medical conditions you have.  Any problems you have had passing stool. What are the risks? Generally, this is a safe procedure. However, problems may occur, including:  Bleeding.  A tear in the intestine.  A reaction to medicines given during the exam.  Infection (rare).  What happens before the procedure? Eating and drinking restrictions Follow instructions from your health care provider about eating and drinking, which may include:  A few days before the procedure - follow a low-fiber diet. Avoid nuts, seeds, dried fruit, raw fruits, and vegetables.  1-3 days before the procedure - follow a clear liquid diet. Drink only clear liquids, such as clear broth or bouillon, black coffee or tea, clear juice, clear soft drinks or sports drinks, gelatin dessert, and popsicles.  Avoid any liquids that contain red or purple dye.  On the day of the procedure - do not eat or drink anything during the 2 hours before the procedure, or within the time period that your health care provider recommends.  Bowel prep If you were prescribed an oral bowel prep to clean out your colon:  Take it as told by your health care provider. Starting the day before your procedure, you will need to drink a large amount of medicated liquid. The liquid will cause you to have multiple loose stools until your stool is almost clear or light green.  If your skin or anus gets irritated from diarrhea, you may use these to relieve the irritation: ? Medicated wipes, such as adult wet wipes with aloe and vitamin E. ? A skin soothing-product like petroleum jelly.  If you vomit while drinking the bowel prep, take a break for up to 60 minutes and then begin the bowel prep again. If vomiting continues and you cannot take the bowel prep without vomiting, call your health care provider.  General instructions  Ask your health care provider about changing or stopping your regular medicines. This is especially important if you are taking diabetes medicines or blood thinners.  Plan to have someone take you home from the hospital or clinic. What happens during the procedure?  An IV tube may be inserted into one of your veins.  You will be given medicine to help you relax (sedative).  To reduce your risk of infection: ? Your health care team will wash or sanitize their hands. ? Your anal area will be  washed with soap.  You will be asked to lie on your side with your knees bent.  Your health care provider will lubricate a long, thin, flexible tube. The tube will have a camera and a light on the end.  The tube will be inserted into your anus.  The tube will be gently eased through your rectum and colon.  Air will be delivered into your colon to keep it open. You may feel some pressure or  cramping.  The camera will be used to take images during the procedure.  A small tissue sample may be removed from your body to be examined under a microscope (biopsy). If any potential problems are found, the tissue will be sent to a lab for testing.  If small polyps are found, your health care provider may remove them and have them checked for cancer cells.  The tube that was inserted into your anus will be slowly removed. The procedure may vary among health care providers and hospitals. What happens after the procedure?  Your blood pressure, heart rate, breathing rate, and blood oxygen level will be monitored until the medicines you were given have worn off.  Do not drive for 24 hours after the exam.  You may have a small amount of blood in your stool.  You may pass gas and have mild abdominal cramping or bloating due to the air that was used to inflate your colon during the exam.  It is up to you to get the results of your procedure. Ask your health care provider, or the department performing the procedure, when your results will be ready. This information is not intended to replace advice given to you by your health care provider. Make sure you discuss any questions you have with your health care provider. Document Released: 09/28/2000 Document Revised: 08/01/2016 Document Reviewed: 12/13/2015 Elsevier Interactive Patient Education  2018 [employer].

## 2018-03-06 NOTE — Progress Notes (Signed)
Patient ID: Carol [name], female   DOB: 16-Jul-1946, 72 y.o.   MRN: [id number]  Chief Complaint  Patient presents with  . Follow-up    HPI Carol [name] is a 72 y.o. female.  Here for follow up right breast cancer. She states she has noticed a round nodule above the right nipple on the areola that showed up after completing her radiation therapy.  Plans for today were to biopsy a previously noted small, 5 mm likely complex cyst in the left breast identified prior to her cancer surgery earlier this year.  Original recommendation is been for six-month follow-up.  HPI  Past Medical History:  Diagnosis Date  . Allergic rhinitis, cause unspecified   . Arthritis   . Breast cancer ([place]) 2019   invasive carcinoma  . Bronchitis    h/o  . Cataract   . Fibromyalgia   . HLD (hyperlipidemia)   . Hypothyroidism   . 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 Other abnormal glucose   . 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 Personal history of colonic polyps   . Personal history of radiation therapy 2019   f/u right breast cancer  . Pre-diabetes   . Shingles   . Swelling of limb   . Thyroid goiter   . Tobacco use disorder   . Unspecified sinusitis (chronic)     Past Surgical History:  Procedure Laterality Date  . APPENDECTOMY  06/1997  . BREAST BIOPSY Right 2002    core with clip  . BREAST BIOPSY Right 2011   benign, clip placed   . BREAST EXCISIONAL BIOPSY Right 12/23/2017   right breast lumpectomy  . BREAST LUMPECTOMY Right 2019   f/u radiation   . COLONOSCOPY  10/15/2012   mutliple polyps.  Outlaw in [place].  Repeat 5 years.  . COLONOSCOPY W/ BIOPSIES  03/30/2004   Adenomatous polyp of the cecum, hyperplastic polyps of the splenic flexure, upper rectum and mid rectum.  . COLPOSCOPY  10/16/2011   HGSIL pap smear.  Negative.  Marland Kitchen EXTERNAL EAR SURGERY  09/2008   external - not specified  (RIGHT)  . MASTECTOMY, PARTIAL Right  12/23/2017   T1b, N0; 8 mm ER: 100%; PR: 100%; Her 2 neu not overexpressed.  Surgeon: Robert Bellow, MD;  Location: [facility] ORS;  Service: General;  Laterality: Right;  . SENTINEL NODE BIOPSY Right 12/23/2017   Procedure: SENTINEL NODE BIOPSY;  Surgeon: Robert Bellow, MD;  Location: [facility] ORS;  Service: General;  Laterality: Right;    Family History  Problem Relation Age of Onset  . Heart failure Father   . Hypertension Father   . Stroke Father   . Hyperlipidemia Father   . Heart failure Brother   . Diabetes Brother   . Heart disease Brother   . Diabetes Brother   . Heart disease Brother   . Hypertension Sister   . Depression Sister   . Hypertension Sister   . Hyperlipidemia Sister   . Arthritis Unknown   . Coronary artery disease Unknown   . Hyperlipidemia Brother   . Hypertension Brother   . Breast cancer Maternal Aunt   . Breast cancer Maternal Grandmother     Social History Social History   Tobacco Use  . Smoking status: Former Smoker    Packs/day: 1.00    Years: 40.00    Pack years: 40.00    Types: Cigarettes    Last attempt to quit: 04/02/2003  Years since quitting: 14.9  . Smokeless tobacco: Never Used  . Tobacco comment: 7-10 cigarettes for past 40 years   Substance Use Topics  . Alcohol use: Yes    Comment: occasional  5 drinks per year  . Drug use: No    No Known Allergies  Current Outpatient Medications  Medication Sig Dispense Refill  . Ascorbic Acid (VITAMIN C) 1000 MG tablet Take 1,000 mg by mouth daily.    Marland Kitchen aspirin 81 MG tablet Take 81 mg by mouth daily.     Marland Kitchen atorvastatin (LIPITOR) 20 MG tablet TAKE 1 TABLET DAILY 90 tablet 1  . cetirizine (ZYRTEC) 10 MG tablet Take 10 mg by mouth every morning.     . Cholecalciferol (VITAMIN D3) 2000 UNITS capsule Take 2,000 Units by mouth daily.      . Coenzyme Q10 (COQ-10) 200 MG CAPS Take 200 mg by mouth daily.    . fluticasone (FLONASE) 50 MCG/ACT nasal spray Place 2 sprays into both nostrils daily.  (Patient taking differently: Place 2 sprays into both nostrils daily as needed for allergies. ) 16 g 6  . furosemide (LASIX) 20 MG tablet Take 1 tablet (20 mg total) by mouth 2 (two) times daily. 180 tablet 1  . Glucosamine Sulfate 1000 MG CAPS Take 1,000 mg by mouth daily.     Marland Kitchen letrozole (FEMARA) 2.5 MG tablet Take 1 tablet (2.5 mg total) by mouth daily. 30 tablet 12  . levothyroxine (SYNTHROID, LEVOTHROID) 50 MCG tablet Take 1 tablet (50 mcg total) by mouth daily. 90 tablet 1  . Misc Natural Products (OSTEO BI-FLEX JOINT SHIELD) TABS Take 2 tablets by mouth daily. With magnesium     . Multiple Vitamin (MULTIVITAMIN) tablet Take 1 tablet by mouth daily.    . Olopatadine HCl (PATADAY OP) Place 1 drop into both eyes daily as needed (allergies).    . Polyvinyl Alcohol-Povidone (REFRESH OP) Place 1 drop into both eyes daily as needed (dry eyes).    Marland Kitchen rOPINIRole (REQUIP) 0.5 MG tablet Take 1 tablet (0.5 mg total) by mouth at bedtime. 90 tablet 1  . senna (SENOKOT) 8.6 MG tablet Take 1-6 tablets by mouth at bedtime.     . polyethylene glycol powder (GLYCOLAX/MIRALAX) powder 255 grams one bottle for colonoscopy prep 255 g 0   No current facility-administered medications for this visit.     Review of Systems Review of Systems  Constitutional: Negative.   Respiratory: Negative.   Cardiovascular: Negative.     Blood pressure (!) 152/88, pulse 79, resp. rate 16, height 5\' 4"  (1.626 m), weight 216 lb (98 kg), SpO2 97 %.  Physical Exam Physical Exam  Constitutional: She is oriented to person, place, and time. She appears well-developed and well-nourished.  HENT:  Mouth/Throat: No oropharyngeal exudate.  Eyes: No scleral icterus.  Neck: Neck supple.  Cardiovascular: Normal rate, regular rhythm and normal heart sounds.  Pulmonary/Chest: Effort normal and breath sounds normal.    Lymphadenopathy:    She has no cervical adenopathy.  Neurological: She is alert and oriented to person, place, and  time.  Skin: Skin is warm and dry.  Psychiatric: Her behavior is normal.    Data Reviewed Ultrasound examination of the upper outer quadrant of the left breast was completed in radial and antiradial fashions between 11 and 2 o'clock position.  The previously identified small, 5 mm likely complex cyst was not visualized.  Ultrasound examination of the new focal thickening in the retroareolar area of the right breast  showed hyperechoic tissue measuring 0.9 x 1.2 x 1.3 cm consistent with inflammatory changes within adipose tissue.  No increased vascular flow.  BI-RADS-3.  Colonoscopy completed through this office in June 2005 showed an adenomatous polyp in the cecum.  The patient's most recent exam was completed in 2014 in [place] with multiple polyps reported.  A 5-year follow-up was recommended to the patient.  Pathology from that exam will be requested.  Assessment    Possible resolution of the previously noted complex cyst in the upper outer quadrant of the left breast.  Changes suggestive of inflammatory fat changes post radiation therapy to the right breast.  Good tolerance of letrozole therapy.  Candidate for follow-up colonoscopy.     Plan     . As the area of original concern in the upper outer quadrant of the left breast is not visualized today, will return to original fallback position which would have been a diagnostic mammogram and ultrasound of the left breast in July 2019.  The patient will return to the office at that time to review the results from the radiology exam and also to assess the area of focal thickening noted in the retroareolar area of the right breast.  Colonoscopy with possible biopsy/polypectomy prn: Information regarding the procedure, including its potential risks and complications (including but not limited to perforation of the bowel, which may require emergency surgery to repair, and bleeding) was verbally given to the patient. Educational  information regarding lower intestinal endoscopy was given to the patient. Written instructions for how to complete the bowel prep using Miralax were provided. The importance of drinking ample fluids to avoid dehydration as a result of the prep emphasized.       HPI, Physical Exam, Assessment and Plan have been scribed under the direction and in the presence of Robert Bellow, MD. Karie Fetch, RN  I have completed the exam and reviewed the above documentation for accuracy and completeness.  I agree with the above.  Haematologist has been used and any errors in dictation or transcription are unintentional.  Hervey Ard, M.D., F.A.C.S.   Forest Gleason Monzerrat Wellen 03/06/2018, 8:03 PM  Patient has been scheduled for a colonoscopy on 03-19-18 at [facility] Miralax prescription has been sent in to the patient's pharmacy today. Colonoscopy instructions have been reviewed with the patient. This patient is aware to call the office if they have further questions.   Patient needs to be scheduled for a unilateral left breast diagnostic mammogram at the [facility] for July 2019. Due to the patient's busy schedule, she is requesting to call [place] directly to schedule. Order in [place]. The patient will call the office once date arranged to schedule an office visit follow up with Dr. Bary Castilla.    Dominga Ferry, CMA

## 2018-03-07 ENCOUNTER — Encounter: Payer: Medicare Other | Admitting: Internal Medicine

## 2018-03-11 ENCOUNTER — Encounter: Payer: Self-pay | Admitting: Family Medicine

## 2018-03-18 ENCOUNTER — Encounter: Payer: Self-pay | Admitting: General Surgery

## 2018-03-18 ENCOUNTER — Encounter: Payer: Self-pay | Admitting: *Deleted

## 2018-03-18 NOTE — Progress Notes (Signed)
Colonoscopy dated September 08, 2013 completed by Arta Silence, MD at the [facility] was reviewed.  Multiple small and large mouth diverticuli noted in the sigmoid colon.  4 mm sessile polyp noted in the ascending colon.  Several hyperplastic appearing rectosigmoid polyps identified.  Pathology not available.  Plan to proceed with colonoscopy on March 19, 2018.

## 2018-03-19 ENCOUNTER — Ambulatory Visit: Payer: Medicare Other | Admitting: Anesthesiology

## 2018-03-19 ENCOUNTER — Encounter
Admission: RE | Disposition: A | Discharge status: Home / Self Care | Payer: Self-pay | Source: Ambulatory Visit | Attending: General Surgery

## 2018-03-19 ENCOUNTER — Other Ambulatory Visit: Payer: Self-pay

## 2018-03-19 ENCOUNTER — Ambulatory Visit
Admission: RE | Admit: 2018-03-19 | Discharge: 2018-03-19 | Disposition: A | Discharge status: Home / Self Care | Payer: Medicare Other | Source: Ambulatory Visit | Attending: General Surgery | Admitting: General Surgery

## 2018-03-19 ENCOUNTER — Encounter: Payer: Self-pay | Admitting: *Deleted

## 2018-03-19 DIAGNOSIS — D124 Benign neoplasm of descending colon: Secondary | ICD-10-CM | POA: Insufficient documentation

## 2018-03-19 DIAGNOSIS — Z833 Family history of diabetes mellitus: Secondary | ICD-10-CM | POA: Diagnosis not present

## 2018-03-19 DIAGNOSIS — E119 Type 2 diabetes mellitus without complications: Secondary | ICD-10-CM | POA: Diagnosis not present

## 2018-03-19 DIAGNOSIS — D123 Benign neoplasm of transverse colon: Secondary | ICD-10-CM | POA: Insufficient documentation

## 2018-03-19 DIAGNOSIS — Z8249 Family history of ischemic heart disease and other diseases of the circulatory system: Secondary | ICD-10-CM | POA: Diagnosis not present

## 2018-03-19 DIAGNOSIS — K621 Rectal polyp: Secondary | ICD-10-CM | POA: Diagnosis not present

## 2018-03-19 DIAGNOSIS — Z86010 Personal history of colonic polyps: Secondary | ICD-10-CM | POA: Diagnosis not present

## 2018-03-19 DIAGNOSIS — J309 Allergic rhinitis, unspecified: Secondary | ICD-10-CM | POA: Insufficient documentation

## 2018-03-19 DIAGNOSIS — H8109 Meniere's disease, unspecified ear: Secondary | ICD-10-CM | POA: Insufficient documentation

## 2018-03-19 DIAGNOSIS — Z823 Family history of stroke: Secondary | ICD-10-CM | POA: Diagnosis not present

## 2018-03-19 DIAGNOSIS — Z9011 Acquired absence of right breast and nipple: Secondary | ICD-10-CM | POA: Insufficient documentation

## 2018-03-19 DIAGNOSIS — Z87891 Personal history of nicotine dependence: Secondary | ICD-10-CM | POA: Diagnosis not present

## 2018-03-19 DIAGNOSIS — E049 Nontoxic goiter, unspecified: Secondary | ICD-10-CM | POA: Insufficient documentation

## 2018-03-19 DIAGNOSIS — Z1211 Encounter for screening for malignant neoplasm of colon: Secondary | ICD-10-CM | POA: Diagnosis not present

## 2018-03-19 DIAGNOSIS — Z803 Family history of malignant neoplasm of breast: Secondary | ICD-10-CM | POA: Insufficient documentation

## 2018-03-19 DIAGNOSIS — Z8261 Family history of arthritis: Secondary | ICD-10-CM | POA: Insufficient documentation

## 2018-03-19 DIAGNOSIS — M797 Fibromyalgia: Secondary | ICD-10-CM | POA: Insufficient documentation

## 2018-03-19 DIAGNOSIS — K6389 Other specified diseases of intestine: Secondary | ICD-10-CM | POA: Diagnosis not present

## 2018-03-19 DIAGNOSIS — Z853 Personal history of malignant neoplasm of breast: Secondary | ICD-10-CM | POA: Diagnosis not present

## 2018-03-19 DIAGNOSIS — E785 Hyperlipidemia, unspecified: Secondary | ICD-10-CM | POA: Diagnosis not present

## 2018-03-19 DIAGNOSIS — Z923 Personal history of irradiation: Secondary | ICD-10-CM | POA: Diagnosis not present

## 2018-03-19 DIAGNOSIS — M1990 Unspecified osteoarthritis, unspecified site: Secondary | ICD-10-CM | POA: Diagnosis not present

## 2018-03-19 HISTORY — PX: COLONOSCOPY WITH PROPOFOL: SHX5780

## 2018-03-19 SURGERY — COLONOSCOPY WITH PROPOFOL
Anesthesia: General

## 2018-03-19 MED ORDER — PHENYLEPHRINE HCL 10 MG/ML IJ SOLN
INTRAMUSCULAR | Status: AC
  Filled 2018-03-19: qty 1

## 2018-03-19 MED ORDER — SODIUM CHLORIDE 0.9 % IV SOLN
INTRAVENOUS | Status: DC
  Administered 2018-03-19: 08:00:00 via INTRAVENOUS

## 2018-03-19 MED ORDER — PROPOFOL 500 MG/50ML IV EMUL
INTRAVENOUS | Status: AC
  Filled 2018-03-19: qty 50

## 2018-03-19 MED ORDER — PHENYLEPHRINE HCL 10 MG/ML IJ SOLN
INTRAMUSCULAR | Status: DC | PRN
  Administered 2018-03-19: 100 ug via INTRAVENOUS

## 2018-03-19 MED ORDER — LIDOCAINE HCL (PF) 2 % IJ SOLN
INTRAMUSCULAR | Status: AC
  Filled 2018-03-19: qty 10

## 2018-03-19 MED ORDER — LIDOCAINE HCL (CARDIAC) PF 100 MG/5ML IV SOSY
PREFILLED_SYRINGE | INTRAVENOUS | Status: DC | PRN
  Administered 2018-03-19: 100 mg via INTRAVENOUS

## 2018-03-19 MED ORDER — PROPOFOL 500 MG/50ML IV EMUL
INTRAVENOUS | Status: DC | PRN
  Administered 2018-03-19: 130 ug/kg/min via INTRAVENOUS

## 2018-03-19 NOTE — H&P (Signed)
No change in clinical history or exam. Tolerated prep well. For colonoscopy.  

## 2018-03-19 NOTE — Op Note (Signed)
[facility] Gastroenterology Patient Name: Carol [name] Procedure Date: 03/19/2018 7:23 AM MRN: [id number] Account #: [id number] Date of Birth: April 02, 1946 Admit Type: Outpatient Age: 72 Room: [facility] ENDO ROOM 1 Gender: Female Note Status: Finalized Procedure:            Colonoscopy Indications:          Screening for colorectal malignant neoplasm, High risk                        colon cancer surveillance: Personal history of colonic                        polyps Providers:            Robert Bellow, MD Referring MD:         Deborra Medina, MD (Referring MD) Medicines:            Monitored Anesthesia Care Complications:        No immediate complications. Procedure:            Pre-Anesthesia Assessment:                       - Prior to the procedure, a History and Physical was                        performed, and patient medications, allergies and                        sensitivities were reviewed. The patient's tolerance of                        previous anesthesia was reviewed.                       - The risks and benefits of the procedure and the                        sedation options and risks were discussed with the                        patient. All questions were answered and informed                        consent was obtained.                       After obtaining informed consent, the colonoscope was                        passed under direct vision. Throughout the procedure,                        the patient's blood pressure, pulse, and oxygen                        saturations were monitored continuously. The                        Colonoscope was introduced through the anus and  advanced to the the cecum, identified by appendiceal                        orifice and ileocecal valve. The colonoscopy was                        somewhat difficult due to a redundant colon and                        significant looping.  Successful completion of the                        procedure was aided by using manual pressure. The                        patient tolerated the procedure well. The quality of                        the bowel preparation was excellent. Findings:      Many medium-mouthed diverticula were found in the sigmoid colon.      Five sessile polyps were found in the rectum, descending colon and       transverse colon. The polyps were 5 mm in size. These were biopsied with       a cold forceps for histology. Impression:           - Diverticulosis in the sigmoid colon.                       - Five 5 mm polyps in the rectum, in the descending                        colon and in the transverse colon. Biopsied. Recommendation:       - Telephone endoscopist for pathology results in 1 week. Procedure Code(s):    --- Professional ---                       [phone], Colonoscopy, flexible; with biopsy, single or                        multiple Diagnosis Code(s):    --- Professional ---                       K57.30, Diverticulosis of large intestine without                        perforation or abscess without bleeding                       D12.3, Benign neoplasm of transverse colon (hepatic                        flexure or splenic flexure)                       D12.4, Benign neoplasm of descending colon                       K62.1, Rectal polyp  Z86.010, Personal history of colonic polyps                       Z12.11, Encounter for screening for malignant neoplasm                        of colon CPT copyright 2017 American Medical Association. All rights reserved. The codes documented in this report are preliminary and upon coder review may  be revised to meet current compliance requirements. Robert Bellow, MD 03/19/2018 8:12:09 AM This report has been signed electronically. Number of Addenda: 0 Note Initiated On: 03/19/2018 7:23 AM Scope Withdrawal Time: 0 hours 11 minutes 23  seconds  Total Procedure Duration: 0 hours 27 minutes 43 seconds       [facility]

## 2018-03-19 NOTE — Anesthesia Post-op Follow-up Note (Signed)
Anesthesia QCDR form completed.        

## 2018-03-19 NOTE — Transfer of Care (Signed)
Immediate Anesthesia Transfer of Care Note  Patient: [name]  Procedure(s) Performed: COLONOSCOPY WITH PROPOFOL (N/A )  Patient Location: PACU and Endoscopy Unit  Anesthesia Type:General  Level of Consciousness: drowsy and patient cooperative  Airway & Oxygen Therapy: Patient Spontanous Breathing and Patient connected to nasal cannula oxygen  Post-op Assessment: Report given to RN, Post -op Vital signs reviewed and stable and Patient moving all extremities X 4  Post vital signs: Reviewed and stable  Last Vitals:  Vitals Value Taken Time  BP 127/51 03/19/2018  8:12 AM  Temp    Pulse 76 03/19/2018  8:12 AM  Resp    SpO2 100 % 03/19/2018  8:12 AM    Last Pain:  Vitals:   03/19/18 0715  TempSrc: Tympanic  PainSc: 0-No pain         Complications: No apparent anesthesia complications

## 2018-03-19 NOTE — Anesthesia Preprocedure Evaluation (Addendum)
Anesthesia Evaluation  Patient identified by MRN, date of birth, ID band Patient awake    Reviewed: Allergy & Precautions, H&P , NPO status , Patient's Chart, lab work & pertinent test results  History of Anesthesia Complications Negative for: history of anesthetic complications  Airway Mallampati: III  TM Distance: <3 FB Neck ROM: limited    Dental  (+) Chipped, Poor Dentition, Caps   Pulmonary neg shortness of breath, former smoker,           Cardiovascular Exercise Tolerance: Good (-) angina(-) Past MI and (-) DOE negative cardio ROS       Neuro/Psych PSYCHIATRIC DISORDERS Anxiety  Neuromuscular disease    GI/Hepatic negative GI ROS, Neg liver ROS,   Endo/Other  Hypothyroidism   Renal/GU      Musculoskeletal   Abdominal   Peds  Hematology negative hematology ROS (+)   Anesthesia Other Findings Signs and symptoms suggestive of sleep apnea   Past Medical History: No date: Allergic rhinitis, cause unspecified No date: Arthritis No date: Bronchitis     Comment:  h/o No date: Cancer ([place]) No date: Cataract No date: Fibromyalgia No date: HLD (hyperlipidemia) No date: Hypothyroidism 2011: Menieres disease No date: Other abnormal glucose No date: 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  Outlaw in [place].  Repeat 5 years. 03/30/2004: COLONOSCOPY W/ BIOPSIES     Comment:  Adenomatous polyp of the cecum, hyperplastic polyps of               the splenic flexure, upper  rectum and mid rectum. 10/16/2011: COLPOSCOPY     Comment:  HGSIL pap smear.  Negative. 09/2008: EXTERNAL EAR SURGERY     Comment:  external - not specified  (RIGHT)     Reproductive/Obstetrics negative OB ROS                             Anesthesia Physical  Anesthesia Plan  ASA: III  Anesthesia Plan: General   Post-op Pain Management:    Induction: Intravenous  PONV Risk Score and Plan: 3 and Propofol infusion  Airway Management Planned: Nasal Cannula  Additional Equipment:   Intra-op Plan:   Post-operative Plan:   Informed Consent: I have reviewed the patients History and Physical, chart, labs and discussed the procedure including the risks, benefits and alternatives for the proposed anesthesia with the patient or authorized representative who has indicated his/her understanding and acceptance.   Dental Advisory Given  Plan Discussed with: Anesthesiologist, CRNA and Surgeon  Anesthesia Plan Comments: (Patient consented for risks of anesthesia including but not limited to:  - adverse reactions to medications - damage to teeth, lips or other oral mucosa - sore throat or hoarseness - Damage to heart, brain, lungs or loss of life  Patient voiced understanding.)        Anesthesia Quick Evaluation

## 2018-03-19 NOTE — Anesthesia Postprocedure Evaluation (Signed)
Anesthesia Post Note  Patient: Carol [name]  Procedure(s) Performed: COLONOSCOPY WITH PROPOFOL (N/A )  Patient location during evaluation: Endoscopy Anesthesia Type: General Level of consciousness: awake and alert Pain management: pain level controlled Vital Signs Assessment: post-procedure vital signs reviewed and stable Respiratory status: spontaneous breathing, nonlabored ventilation, respiratory function stable and patient connected to nasal cannula oxygen Cardiovascular status: blood pressure returned to baseline and stable Postop Assessment: no apparent nausea or vomiting Anesthetic complications: no     Last Vitals:  Vitals:   03/19/18 0812 03/19/18 0821  BP: (!) 127/51 (!) 126/54  Pulse: 76 86  Resp:  15  Temp:    SpO2: 100% 97%    Last Pain:  Vitals:   03/19/18 0821  TempSrc:   PainSc: 0-No pain                 Martha Clan

## 2018-03-20 ENCOUNTER — Other Ambulatory Visit: Payer: Self-pay

## 2018-03-20 ENCOUNTER — Ambulatory Visit
Admission: RE | Admit: 2018-03-20 | Discharge: 2018-03-20 | Disposition: A | Discharge status: Home / Self Care | Payer: Medicare Other | Source: Ambulatory Visit | Attending: Radiation Oncology | Admitting: Radiation Oncology

## 2018-03-20 ENCOUNTER — Telehealth: Payer: Self-pay | Admitting: General Surgery

## 2018-03-20 ENCOUNTER — Encounter: Payer: Self-pay | Admitting: General Surgery

## 2018-03-20 VITALS — BP 135/80 | HR 84 | Temp 97.1°F | Resp 20 | Wt 216.2 lb

## 2018-03-20 DIAGNOSIS — C50411 Malignant neoplasm of upper-outer quadrant of right female breast: Secondary | ICD-10-CM | POA: Insufficient documentation

## 2018-03-20 DIAGNOSIS — Z170 Estrogen receptor positive status [ER+]: Secondary | ICD-10-CM | POA: Insufficient documentation

## 2018-03-20 LAB — SURGICAL PATHOLOGY

## 2018-03-20 NOTE — Telephone Encounter (Signed)
Message left that pathology was benign.  Have recommended a follow-up exam in 5 years.

## 2018-03-20 NOTE — Progress Notes (Signed)
Radiation Oncology Follow up Note  Name: Carol [name]   Date:   03/20/2018 MRN:  [id number] DOB: 04/15/1946    This 72 y.o. female presents to the clinic today for one-month follow-up status post.accelerated partial breast irradiation to her right breast for stage I ER/PR positive invasive mammary carcinoma  REFERRING PROVIDER: Crecencio Mc, MD  HPI: patient is a 72 year old female now out 1 month having completed radiation therapy to her right breast using accelerated partial breast radiation for stage I ER/PR positive invasive mammary carcinoma. Seen today in routine follow-up she is doing well. She has detected a slight nodule at the 12:00 position of the right breast near the nipple areolar complex. She specifically denies breast tenderness cough or bone pain. She has started on.Femara tongue that well except for hot flashes.  COMPLICATIONS OF TREATMENT: none  FOLLOW UP COMPLIANCE: keeps appointments   PHYSICAL EXAM:  BP 135/80   Pulse 84   Temp (!) 97.1 F (36.2 C)   Resp 20   Wt 216 lb 2.6 oz (98.1 kg)   BMI 38.29 kg/m  Lungs are clear to A&P cardiac examination essentially unremarkable with regular rate and rhythm. No dominant mass or nodularity is noted in either breast in 2 positions examined. Incision is well-healed. No axillary or supraclavicular adenopathy is appreciated. Cosmetic result is excellent.there is a slight nodulararea in the 12:00 position of the right breast near the nipple areolar complex feels cystic rather than carcinoma. Well-developed well-nourished patient in NAD. HEENT reveals PERLA, EOMI, discs not visualized.  Oral cavity is clear. No oral mucosal lesions are identified. Neck is clear without evidence of cervical or supraclavicular adenopathy. Lungs are clear to A&P. Cardiac examination is essentially unremarkable with regular rate and rhythm without murmur rub or thrill. Abdomen is benign with no organomegaly or masses noted. Motor sensory and  DTR levels are equal and symmetric in the upper and lower extremities. Cranial nerves II through XII are grossly intact. Proprioception is intact. No peripheral adenopathy or edema is identified. No motor or sensory levels are noted. Crude visual fields are within normal range.  RADIOLOGY RESULTS: no current films for review  PLAN: present time patient is doing well. She may have some scar tissue which is the reason for this nodularity the 12:00 position of the right breast. Dr. Tollie Pizza will be following this along with mammogram which is to be ordered in September I believe. She continues on Femara I have suggested vitamin E supplements to help with some hot flashes. I have asked to see around 4-5 months for follow-up. Patient is to call sooner with any concerns. I would like to take this opportunity to thank you for allowing me to participate in the care of your patient.Noreene Filbert, MD

## 2018-03-21 NOTE — Progress Notes (Signed)
Prolia auth intiated via phone.  Ref# [id number]

## 2018-03-26 NOTE — Progress Notes (Deleted)
PA initiated: Ref# [id number]

## 2018-04-01 ENCOUNTER — Telehealth: Payer: Self-pay | Admitting: Internal Medicine

## 2018-04-01 NOTE — Telephone Encounter (Signed)
Patient last office note has been faxed to [place] at [facility] [phone].

## 2018-04-01 NOTE — Telephone Encounter (Signed)
Copied from [place] [id number]. Topic: Quick Communication - See Telephone Encounter >> Apr 01, 2018 11:20 AM Bea Graff, NT wrote: CRM for notification. See Telephone encounter for: 04/01/18. Bernadette with [facility] is calling and needing chart notes to support the need for the pt to have the denosumab injection. She states when phoning in it looks like PA is not required but in order for insurance to cover they do need chart notes supporting this. If not this will be denied. CB#: [phone] Fax#: [phone]. Needed by 2pm today! She states she has called and faxed previously 6 times. Either fax office notes or withdraw the request.

## 2018-04-03 ENCOUNTER — Telehealth: Payer: Self-pay | Admitting: *Deleted

## 2018-04-03 NOTE — Telephone Encounter (Signed)
-----   Message from Crecencio Mc, MD sent at 02/19/2018  2:05 PM EDT ----- Ok to proceed with Prolia PA.  Reason:  Moderate osteopenia in a postmenopausal woman starting endocrine therapy for breast cancer

## 2018-04-03 NOTE — Telephone Encounter (Signed)
Insurance verification for Prolia filed on [employer] Portal. 

## 2018-04-22 ENCOUNTER — Telehealth: Payer: Self-pay | Admitting: Internal Medicine

## 2018-04-22 ENCOUNTER — Telehealth: Payer: Self-pay

## 2018-04-22 DIAGNOSIS — E782 Mixed hyperlipidemia: Secondary | ICD-10-CM

## 2018-04-22 DIAGNOSIS — E7800 Pure hypercholesterolemia, unspecified: Secondary | ICD-10-CM

## 2018-04-22 DIAGNOSIS — E559 Vitamin D deficiency, unspecified: Secondary | ICD-10-CM

## 2018-04-22 DIAGNOSIS — R7303 Prediabetes: Secondary | ICD-10-CM

## 2018-04-22 DIAGNOSIS — E039 Hypothyroidism, unspecified: Secondary | ICD-10-CM

## 2018-04-22 MED ORDER — ATORVASTATIN CALCIUM 20 MG PO TABS
ORAL_TABLET | ORAL | 1 refills | Status: DC
Start: 2018-04-22 — End: 2018-10-20

## 2018-04-22 MED ORDER — LETROZOLE 2.5 MG PO TABS: 2.5000 mg | ORAL_TABLET | Freq: Every day | ORAL | 2 refills | Status: DC

## 2018-04-22 NOTE — Telephone Encounter (Signed)
Pt inquiring about DENOSUMAB inj.Pt states she received a letter from the insurance company last week that notified her that insurance has approved the medication. Pt would like for rx of this medication to be sent to [facility]

## 2018-04-22 NOTE — Telephone Encounter (Signed)
Pt's states she contacted pharmacy regarding refill of Atorvastatin and pharmacy stated that prescription sent on 03/04/18 was not received.  Pt also states that refill of Femara needs to be in a 90 day supply and not 30 day supply. Last prescription was sent on 03/04/18 #30 with 11 refills Prescriptions resent to [facility].

## 2018-04-22 NOTE — Telephone Encounter (Signed)
Vit d added   Thanks!

## 2018-04-22 NOTE — Telephone Encounter (Signed)
Copied from [place] [id number]. Topic: Inquiry >> Apr 22, 2018 11:40 AM Oliver Pila B wrote: Reason for CRM: pt called to let pcp know that insurance has approved her for the DENOSUMAB inj and the pt states she would like to get this thru [facility]; contact pt to advise

## 2018-04-22 NOTE — Addendum Note (Signed)
Addended by: Adair Laundry on: 04/22/2018 12:48 PM   Modules accepted: Orders

## 2018-04-22 NOTE — Addendum Note (Signed)
Addended by: Crecencio Mc on: 04/22/2018 03:57 PM   Modules accepted: Orders

## 2018-04-22 NOTE — Telephone Encounter (Signed)
Copied from [place]. Topic: Quick Communication - Rx Refill/Question >> Apr 22, 2018 11:35 AM Oliver Pila B wrote: Medication: atorvastatin (LIPITOR) 20 MG tablet [[id number]]  letrozole (FEMARA) 2.5 MG tablet [[id number]] (90 day supply)   Has the patient contacted their pharmacy? Yes.   (Agent: If no, request that the patient contact the pharmacy for the refill.) (Agent: If yes, when and what did the pharmacy advise?)  Preferred Pharmacy (with phone number or street name): [facility]  Agent: Please be advised that RX refills may take up to 3 business days. We ask that you follow-up with your pharmacy.

## 2018-04-22 NOTE — Telephone Encounter (Signed)
Ordered TSH, Lipid, CMP, A1C. Is there anything else that needs to be ordered?

## 2018-04-22 NOTE — Telephone Encounter (Signed)
Copied from [place] [id number]. Topic: Inquiry >> Apr 22, 2018 11:38 AM Oliver Pila B wrote: Reason for CRM: pt has a cpe appt 12.18.19 and would like labs to be drawn before the appt, contact pt to advise and create lab order

## 2018-04-23 NOTE — Telephone Encounter (Signed)
Medication approved by insurance.

## 2018-04-23 NOTE — Telephone Encounter (Signed)
Prolia approved for one year, scheduled patient for 04/24/18 first injection.

## 2018-04-24 ENCOUNTER — Ambulatory Visit: Payer: Medicare Other

## 2018-05-05 ENCOUNTER — Ambulatory Visit
Admission: RE | Admit: 2018-05-05 | Discharge: 2018-05-05 | Disposition: A | Discharge status: Home / Self Care | Payer: Medicare Other | Source: Ambulatory Visit | Attending: General Surgery | Admitting: General Surgery

## 2018-05-05 DIAGNOSIS — Z170 Estrogen receptor positive status [ER+]: Secondary | ICD-10-CM | POA: Diagnosis present

## 2018-05-05 DIAGNOSIS — C50411 Malignant neoplasm of upper-outer quadrant of right female breast: Secondary | ICD-10-CM | POA: Insufficient documentation

## 2018-05-05 DIAGNOSIS — N6321 Unspecified lump in the left breast, upper outer quadrant: Secondary | ICD-10-CM

## 2018-05-06 ENCOUNTER — Ambulatory Visit (INDEPENDENT_AMBULATORY_CARE_PROVIDER_SITE_OTHER): Payer: Medicare Other | Admitting: *Deleted

## 2018-05-06 DIAGNOSIS — M85852 Other specified disorders of bone density and structure, left thigh: Secondary | ICD-10-CM | POA: Diagnosis not present

## 2018-05-06 MED ORDER — DENOSUMAB 60 MG/ML ~~LOC~~ SOSY
60.0000 mg | PREFILLED_SYRINGE | Freq: Once | SUBCUTANEOUS | Status: AC
  Administered 2018-05-06: 60 mg via SUBCUTANEOUS

## 2018-05-06 NOTE — Progress Notes (Signed)
Patient presented for Prolia injection to Left [place], patient voiced no concerns or complaints during or after injection.

## 2018-05-06 NOTE — Assessment & Plan Note (Signed)
Prolia injection given 05/06/18 next due 11/06/18

## 2018-05-12 ENCOUNTER — Telehealth: Payer: Self-pay

## 2018-05-12 NOTE — Telephone Encounter (Signed)
Recent mammograms reviewed.  Small density thought to represent a resolving cyst no longer evident.  We will plan for bilateral diagnostic mammograms in December 2019 with office visit to follow.

## 2018-05-12 NOTE — Telephone Encounter (Signed)
Patient wanted to know if she needed to be seen after her follow up mammogram done on 05/05/18. She states that the area on the breast is gone. She does not want to take up the doctor's time unless he feels it is necessary. She is scheduled to follow up 05/13/18.

## 2018-05-13 ENCOUNTER — Ambulatory Visit: Payer: Medicare Other | Admitting: General Surgery

## 2018-07-23 ENCOUNTER — Encounter: Payer: Self-pay | Admitting: *Deleted

## 2018-07-23 ENCOUNTER — Other Ambulatory Visit: Payer: Self-pay

## 2018-07-28 NOTE — Discharge Instructions (Signed)

## 2018-07-30 ENCOUNTER — Encounter
Admission: RE | Disposition: A | Discharge status: Home / Self Care | Payer: Self-pay | Source: Ambulatory Visit | Attending: Ophthalmology

## 2018-07-30 ENCOUNTER — Ambulatory Visit: Payer: Medicare Other | Admitting: Anesthesiology

## 2018-07-30 ENCOUNTER — Ambulatory Visit
Admission: RE | Admit: 2018-07-30 | Discharge: 2018-07-30 | Disposition: A | Discharge status: Home / Self Care | Payer: Medicare Other | Source: Ambulatory Visit | Attending: Ophthalmology | Admitting: Ophthalmology

## 2018-07-30 DIAGNOSIS — Z79890 Hormone replacement therapy (postmenopausal): Secondary | ICD-10-CM | POA: Diagnosis not present

## 2018-07-30 DIAGNOSIS — H2512 Age-related nuclear cataract, left eye: Secondary | ICD-10-CM | POA: Insufficient documentation

## 2018-07-30 DIAGNOSIS — Z87891 Personal history of nicotine dependence: Secondary | ICD-10-CM | POA: Insufficient documentation

## 2018-07-30 DIAGNOSIS — E7800 Pure hypercholesterolemia, unspecified: Secondary | ICD-10-CM | POA: Insufficient documentation

## 2018-07-30 DIAGNOSIS — E039 Hypothyroidism, unspecified: Secondary | ICD-10-CM | POA: Diagnosis not present

## 2018-07-30 DIAGNOSIS — Z7982 Long term (current) use of aspirin: Secondary | ICD-10-CM | POA: Diagnosis not present

## 2018-07-30 DIAGNOSIS — Z79899 Other long term (current) drug therapy: Secondary | ICD-10-CM | POA: Insufficient documentation

## 2018-07-30 DIAGNOSIS — Z853 Personal history of malignant neoplasm of breast: Secondary | ICD-10-CM | POA: Diagnosis not present

## 2018-07-30 HISTORY — PX: CATARACT EXTRACTION W/PHACO: SHX586

## 2018-07-30 HISTORY — DX: Presence of dental prosthetic device (complete) (partial): Z97.2

## 2018-07-30 SURGERY — PHACOEMULSIFICATION, CATARACT, WITH IOL INSERTION
Anesthesia: Monitor Anesthesia Care | Site: Eye | Laterality: Left

## 2018-07-30 MED ORDER — LIDOCAINE HCL (PF) 2 % IJ SOLN
INTRAOCULAR | Status: DC | PRN
  Administered 2018-07-30: 2 mL

## 2018-07-30 MED ORDER — FENTANYL CITRATE (PF) 100 MCG/2ML IJ SOLN
INTRAMUSCULAR | Status: DC | PRN
  Administered 2018-07-30: 50 ug via INTRAVENOUS

## 2018-07-30 MED ORDER — ARMC OPHTHALMIC DILATING DROPS
1.0000 "application " | OPHTHALMIC | Status: DC | PRN
  Administered 2018-07-30 (×3): 1 via OPHTHALMIC

## 2018-07-30 MED ORDER — MIDAZOLAM HCL 2 MG/2ML IJ SOLN
INTRAMUSCULAR | Status: DC | PRN
  Administered 2018-07-30: 2 mg via INTRAVENOUS

## 2018-07-30 MED ORDER — NA HYALUR & NA CHOND-NA HYALUR 0.4-0.35 ML IO KIT
PACK | INTRAOCULAR | Status: DC | PRN
  Administered 2018-07-30: 1 mL via INTRAOCULAR

## 2018-07-30 MED ORDER — EPINEPHRINE PF 1 MG/ML IJ SOLN
INTRAOCULAR | Status: DC | PRN
  Administered 2018-07-30: 65 mL via OPHTHALMIC

## 2018-07-30 MED ORDER — BRIMONIDINE TARTRATE-TIMOLOL 0.2-0.5 % OP SOLN
OPHTHALMIC | Status: DC | PRN
  Administered 2018-07-30: 1 [drp] via OPHTHALMIC

## 2018-07-30 MED ORDER — CEFUROXIME OPHTHALMIC INJECTION 1 MG/0.1 ML
INJECTION | OPHTHALMIC | Status: DC | PRN
  Administered 2018-07-30: 0.1 mL via INTRACAMERAL

## 2018-07-30 MED ORDER — ONDANSETRON HCL 4 MG/2ML IJ SOLN: 4.0000 mg | Freq: Once | INTRAMUSCULAR | Status: DC | PRN

## 2018-07-30 MED ORDER — MOXIFLOXACIN HCL 0.5 % OP SOLN
1.0000 [drp] | OPHTHALMIC | Status: DC | PRN
  Administered 2018-07-30 (×3): 1 [drp] via OPHTHALMIC

## 2018-07-30 MED ORDER — LACTATED RINGERS IV SOLN: 10.0000 mL/h | INTRAVENOUS | Status: DC

## 2018-07-30 MED ORDER — TETRACAINE HCL 0.5 % OP SOLN
1.0000 [drp] | OPHTHALMIC | Status: DC | PRN
  Administered 2018-07-30 (×2): 1 [drp] via OPHTHALMIC

## 2018-07-30 SURGICAL SUPPLY — 20 items
CANNULA ANT/CHMB 27G (MISCELLANEOUS) ×1 IMPLANT
CANNULA ANT/CHMB 27GA (MISCELLANEOUS) ×3 IMPLANT
GLOVE SURG LX 7.5 STRW (GLOVE) ×2
GLOVE SURG LX STRL 7.5 STRW (GLOVE) ×1 IMPLANT
GLOVE SURG TRIUMPH 8.0 PF LTX (GLOVE) ×3 IMPLANT
GOWN STRL REUS W/ TWL LRG LVL3 (GOWN DISPOSABLE) ×2 IMPLANT
GOWN STRL REUS W/TWL LRG LVL3 (GOWN DISPOSABLE) ×4
LENS IOL TECNIS ITEC 23.0 (Intraocular Lens) ×2 IMPLANT
MARKER SKIN DUAL TIP RULER LAB (MISCELLANEOUS) ×3 IMPLANT
NDL FILTER BLUNT 18X1 1/2 (NEEDLE) ×1 IMPLANT
NEEDLE FILTER BLUNT 18X 1/2SAF (NEEDLE) ×2
NEEDLE FILTER BLUNT 18X1 1/2 (NEEDLE) ×1 IMPLANT
PACK CATARACT BRASINGTON (MISCELLANEOUS) ×3 IMPLANT
PACK EYE AFTER SURG (MISCELLANEOUS) ×3 IMPLANT
PACK OPTHALMIC (MISCELLANEOUS) ×3 IMPLANT
SYR 3ML LL SCALE MARK (SYRINGE) ×3 IMPLANT
SYR 5ML LL (SYRINGE) ×3 IMPLANT
SYR TB 1ML LUER SLIP (SYRINGE) ×3 IMPLANT
WATER STERILE IRR 500ML POUR (IV SOLUTION) ×3 IMPLANT
WIPE NON LINTING 3.25X3.25 (MISCELLANEOUS) ×3 IMPLANT

## 2018-07-30 NOTE — Transfer of Care (Signed)
Immediate Anesthesia Transfer of Care Note  Patient: Carol [name]  Procedure(s) Performed: CATARACT EXTRACTION PHACO AND INTRAOCULAR LENS PLACEMENT (IOC) LEFT (Left Eye)  Patient Location: PACU  Anesthesia Type: MAC  Level of Consciousness: awake, alert  and patient cooperative  Airway and Oxygen Therapy: Patient Spontanous Breathing and Patient connected to supplemental oxygen  Post-op Assessment: Post-op Vital signs reviewed, Patient's Cardiovascular Status Stable, Respiratory Function Stable, Patent Airway and No signs of Nausea or vomiting  Post-op Vital Signs: Reviewed and stable  Complications: No apparent anesthesia complications

## 2018-07-30 NOTE — Anesthesia Preprocedure Evaluation (Signed)
Anesthesia Evaluation  Patient identified by MRN, date of birth, ID band Patient awake    Reviewed: Allergy & Precautions, H&P , NPO status , Patient's Chart, lab work & pertinent test results  Airway Mallampati: II  TM Distance: >3 FB Neck ROM: full    Dental no notable dental hx.    Pulmonary former smoker,    Pulmonary exam normal breath sounds clear to auscultation       Cardiovascular negative cardio ROS Normal cardiovascular exam     Neuro/Psych    GI/Hepatic negative GI ROS, Neg liver ROS,   Endo/Other  Hypothyroidism   Renal/GU negative Renal ROS     Musculoskeletal   Abdominal   Peds  Hematology negative hematology ROS (+)   Anesthesia Other Findings   Reproductive/Obstetrics negative OB ROS                             Anesthesia Physical Anesthesia Plan  ASA: II  Anesthesia Plan: MAC   Post-op Pain Management:    Induction:   PONV Risk Score and Plan:   Airway Management Planned:   Additional Equipment:   Intra-op Plan:   Post-operative Plan:   Informed Consent: I have reviewed the patients History and Physical, chart, labs and discussed the procedure including the risks, benefits and alternatives for the proposed anesthesia with the patient or authorized representative who has indicated his/her understanding and acceptance.     Plan Discussed with:   Anesthesia Plan Comments:         Anesthesia Quick Evaluation

## 2018-07-30 NOTE — Anesthesia Procedure Notes (Signed)
Procedure Name: [place] Performed by: Cameron Ali, CRNA Pre-anesthesia Checklist: Patient identified, Emergency Drugs available, Suction available, Timeout performed and Patient being monitored Patient Re-evaluated:Patient Re-evaluated prior to induction Oxygen Delivery Method: Nasal cannula Placement Confirmation: positive ETCO2

## 2018-07-30 NOTE — H&P (Signed)
The History and Physical notes are on paper, have been signed, and are to be scanned. The patient remains stable and unchanged from the H&P.   Previous H&P reviewed, patient examined, and there are no changes.  Carol [name] 07/30/2018 10:43 AM

## 2018-07-30 NOTE — Anesthesia Postprocedure Evaluation (Signed)
Anesthesia Post Note  Patient: Carol [name]  Procedure(s) Performed: CATARACT EXTRACTION PHACO AND INTRAOCULAR LENS PLACEMENT (IOC) LEFT (Left Eye)  Patient location during evaluation: PACU Anesthesia Type: MAC Level of consciousness: awake and alert Pain management: pain level controlled Vital Signs Assessment: post-procedure vital signs reviewed and stable Respiratory status: spontaneous breathing Cardiovascular status: blood pressure returned to baseline Anesthetic complications: no    Jaci Standard, III,  Sigourney Portillo D

## 2018-07-30 NOTE — Op Note (Signed)
OPERATIVE NOTE  Carol [name] [id number] 07/30/2018   PREOPERATIVE DIAGNOSIS:  Nuclear sclerotic cataract left eye. H25.12   POSTOPERATIVE DIAGNOSIS:    Nuclear sclerotic cataract left eye.     PROCEDURE:  Phacoemusification with posterior chamber intraocular lens placement of the left eye   LENS:   Implant Name Type Inv. Item Serial No. Manufacturer Lot No. LRB No. Used  LENS IOL DIOP 23.0 - [id number] Intraocular Lens LENS IOL DIOP 23.0 [id number] AMO  Left 1        ULTRASOUND TIME: 10  % of 1 minutes 8 seconds, CDE 7.1  SURGEON:  Wyonia Hough, MD   ANESTHESIA:  Topical with tetracaine drops and 2% Xylocaine jelly, augmented with 1% preservative-free intracameral lidocaine.    COMPLICATIONS:  None.   DESCRIPTION OF PROCEDURE:  The patient was identified in the holding room and transported to the operating room and placed in the supine position under the operating microscope.  The left eye was identified as the operative eye and it was prepped and draped in the usual sterile ophthalmic fashion.   A 1 millimeter clear-corneal paracentesis was made at the 2:30 position.  0.5 ml of preservative-free 1% lidocaine was injected into the anterior chamber.  The anterior chamber was filled with Viscoat viscoelastic.  A 2.4 millimeter keratome was used to make a near-clear corneal incision at the 12:00 position.  .  A curvilinear capsulorrhexis was made with a cystotome and capsulorrhexis forceps.  Balanced salt solution was used to hydrodissect and hydrodelineate the nucleus.   Phacoemulsification was then used in stop and chop fashion to remove the lens nucleus and epinucleus.  The remaining cortex was then removed using the irrigation and aspiration handpiece. Provisc was then placed into the capsular bag to distend it for lens placement.  A lens was then injected into the capsular bag.  The remaining viscoelastic was aspirated.   Wounds were hydrated with balanced  salt solution.  The anterior chamber was inflated to a physiologic pressure with balanced salt solution.  No wound leaks were noted. Cefuroxime 0.1 ml of a 10mg /ml solution was injected into the anterior chamber for a dose of 1 mg of intracameral antibiotic at the completion of the case.   Timolol and Brimonidine drops were applied to the eye.  The patient was taken to the recovery room in stable condition without complications of anesthesia or surgery.  Carol [name] 07/30/2018, 11:33 AM

## 2018-08-20 ENCOUNTER — Encounter: Payer: Self-pay | Admitting: Radiation Oncology

## 2018-08-20 ENCOUNTER — Other Ambulatory Visit: Payer: Self-pay

## 2018-08-20 ENCOUNTER — Ambulatory Visit
Admission: RE | Admit: 2018-08-20 | Discharge: 2018-08-20 | Disposition: A | Discharge status: Home / Self Care | Payer: Medicare Other | Source: Ambulatory Visit | Attending: Radiation Oncology | Admitting: Radiation Oncology

## 2018-08-20 DIAGNOSIS — Z923 Personal history of irradiation: Secondary | ICD-10-CM | POA: Insufficient documentation

## 2018-08-20 DIAGNOSIS — Z79811 Long term (current) use of aromatase inhibitors: Secondary | ICD-10-CM | POA: Insufficient documentation

## 2018-08-20 DIAGNOSIS — C50811 Malignant neoplasm of overlapping sites of right female breast: Secondary | ICD-10-CM | POA: Diagnosis present

## 2018-08-20 DIAGNOSIS — Z170 Estrogen receptor positive status [ER+]: Secondary | ICD-10-CM | POA: Diagnosis not present

## 2018-08-20 DIAGNOSIS — C50411 Malignant neoplasm of upper-outer quadrant of right female breast: Secondary | ICD-10-CM

## 2018-08-20 NOTE — Progress Notes (Signed)
Radiation Oncology Follow up Note  Name: Carol [name]   Date:   08/20/2018 MRN:  [id number] DOB: 19-Nov-1945    This 72 y.o. female presents to the clinic today for 6 month follow-up status post accelerated partial breast radiation to her right breast for stage I ER/PR positive invasive mammary carcinoma.  REFERRING PROVIDER: Crecencio Mc, MD  HPI: patient is a 72 year old female now seen out 6 months having completed accelerated partial breast irradiation to her right breast for stage I ER/PR positive invasive mammary carcinoma. Seen today in routine follow-up she is doing well. She specifically denies breast tenderness cough or bone pain but. Does occasionally has some slight shooting pains in her breast most likely related to nerve healing. She's not yet had a follow-up mammogram..she is currently on Femara tolerating that well without side effect.  COMPLICATIONS OF TREATMENT: none  FOLLOW UP COMPLIANCE: keeps appointments   PHYSICAL EXAM:  BP (P) 131/71 (BP Location: Left Arm, Patient Position: Sitting)   Pulse (P) 81   Temp (!) (P) 97.3 F (36.3 C) (Tympanic)   Wt (P) 224 lb 15.7 oz (102 kg)   BMI (P) 39.85 kg/m  Lungs are clear to A&P cardiac examination essentially unremarkable with regular rate and rhythm. No dominant mass or nodularity is noted in either breast in 2 positions examined. Incision is well-healed. No axillary or supraclavicular adenopathy is appreciated. Cosmetic result is excellent.Well-developed well-nourished patient in NAD. HEENT reveals PERLA, EOMI, discs not visualized.  Oral cavity is clear. No oral mucosal lesions are identified. Neck is clear without evidence of cervical or supraclavicular adenopathy. Lungs are clear to A&P. Cardiac examination is essentially unremarkable with regular rate and rhythm without murmur rub or thrill. Abdomen is benign with no organomegaly or masses noted. Motor sensory and DTR levels are equal and symmetric in the upper  and lower extremities. Cranial nerves II through XII are grossly intact. Proprioception is intact. No peripheral adenopathy or edema is identified. No motor or sensory levels are noted. Crude visual fields are within normal range.  RADIOLOGY RESULTS: no current films for review  PLAN: at the present time she is doing well with no evidence of disease. I'm please were overall progress. She will be seeing Dr. Tollie Pizza in the near future and I assume mammograms will be ordered. I have asked to see her back in 1 year for follow-up. She continues on Femara without side effect. Patient is to call with any concerns at any time.  I would like to take this opportunity to thank you for allowing me to participate in the care of your patient.Noreene Filbert, MD

## 2018-08-24 ENCOUNTER — Other Ambulatory Visit: Payer: Self-pay | Admitting: Internal Medicine

## 2018-08-24 DIAGNOSIS — I878 Other specified disorders of veins: Secondary | ICD-10-CM

## 2018-08-25 NOTE — Discharge Instructions (Signed)

## 2018-08-27 ENCOUNTER — Ambulatory Visit: Payer: Medicare Other | Admitting: Anesthesiology

## 2018-08-27 ENCOUNTER — Encounter
Admission: RE | Disposition: A | Discharge status: Home / Self Care | Payer: Self-pay | Source: Ambulatory Visit | Attending: Ophthalmology

## 2018-08-27 ENCOUNTER — Ambulatory Visit
Admission: RE | Admit: 2018-08-27 | Discharge: 2018-08-27 | Disposition: A | Discharge status: Home / Self Care | Payer: Medicare Other | Source: Ambulatory Visit | Attending: Ophthalmology | Admitting: Ophthalmology

## 2018-08-27 ENCOUNTER — Other Ambulatory Visit: Payer: Self-pay | Admitting: Internal Medicine

## 2018-08-27 DIAGNOSIS — G2581 Restless legs syndrome: Secondary | ICD-10-CM | POA: Insufficient documentation

## 2018-08-27 DIAGNOSIS — M797 Fibromyalgia: Secondary | ICD-10-CM | POA: Insufficient documentation

## 2018-08-27 DIAGNOSIS — E039 Hypothyroidism, unspecified: Secondary | ICD-10-CM | POA: Insufficient documentation

## 2018-08-27 DIAGNOSIS — H2511 Age-related nuclear cataract, right eye: Secondary | ICD-10-CM | POA: Insufficient documentation

## 2018-08-27 DIAGNOSIS — Z853 Personal history of malignant neoplasm of breast: Secondary | ICD-10-CM | POA: Diagnosis not present

## 2018-08-27 DIAGNOSIS — Z7982 Long term (current) use of aspirin: Secondary | ICD-10-CM | POA: Insufficient documentation

## 2018-08-27 DIAGNOSIS — Z87891 Personal history of nicotine dependence: Secondary | ICD-10-CM | POA: Insufficient documentation

## 2018-08-27 DIAGNOSIS — E7800 Pure hypercholesterolemia, unspecified: Secondary | ICD-10-CM | POA: Insufficient documentation

## 2018-08-27 DIAGNOSIS — Z79899 Other long term (current) drug therapy: Secondary | ICD-10-CM | POA: Insufficient documentation

## 2018-08-27 HISTORY — PX: CATARACT EXTRACTION W/PHACO: SHX586

## 2018-08-27 SURGERY — PHACOEMULSIFICATION, CATARACT, WITH IOL INSERTION
Anesthesia: Monitor Anesthesia Care | Laterality: Right

## 2018-08-27 MED ORDER — NA HYALUR & NA CHOND-NA HYALUR 0.4-0.35 ML IO KIT
PACK | INTRAOCULAR | Status: DC | PRN
  Administered 2018-08-27: 1 mL via INTRAOCULAR

## 2018-08-27 MED ORDER — ONDANSETRON HCL 4 MG/2ML IJ SOLN: 4.0000 mg | Freq: Once | INTRAMUSCULAR | Status: DC | PRN

## 2018-08-27 MED ORDER — MOXIFLOXACIN HCL 0.5 % OP SOLN
1.0000 [drp] | OPHTHALMIC | Status: DC | PRN
  Administered 2018-08-27 (×3): 1 [drp] via OPHTHALMIC

## 2018-08-27 MED ORDER — TETRACAINE HCL 0.5 % OP SOLN
1.0000 [drp] | OPHTHALMIC | Status: DC | PRN
  Administered 2018-08-27 (×2): 1 [drp] via OPHTHALMIC

## 2018-08-27 MED ORDER — LIDOCAINE HCL (PF) 2 % IJ SOLN
INTRAOCULAR | Status: DC | PRN
  Administered 2018-08-27: 1 mL via INTRAMUSCULAR

## 2018-08-27 MED ORDER — ERYTHROMYCIN 5 MG/GM OP OINT
TOPICAL_OINTMENT | OPHTHALMIC | Status: DC | PRN
  Administered 2018-08-27: 1 via OPHTHALMIC

## 2018-08-27 MED ORDER — ACETAMINOPHEN 325 MG PO TABS: 650.0000 mg | ORAL_TABLET | Freq: Once | ORAL | Status: DC | PRN

## 2018-08-27 MED ORDER — BRIMONIDINE TARTRATE-TIMOLOL 0.2-0.5 % OP SOLN
OPHTHALMIC | Status: DC | PRN
  Administered 2018-08-27: 1 [drp] via OPHTHALMIC

## 2018-08-27 MED ORDER — MIDAZOLAM HCL 2 MG/2ML IJ SOLN
INTRAMUSCULAR | Status: DC | PRN
  Administered 2018-08-27: 2 mg via INTRAVENOUS

## 2018-08-27 MED ORDER — CEFUROXIME OPHTHALMIC INJECTION 1 MG/0.1 ML
INJECTION | OPHTHALMIC | Status: DC | PRN
  Administered 2018-08-27: 1 mg via INTRACAMERAL

## 2018-08-27 MED ORDER — FENTANYL CITRATE (PF) 100 MCG/2ML IJ SOLN
INTRAMUSCULAR | Status: DC | PRN
  Administered 2018-08-27 (×2): 50 ug via INTRAVENOUS

## 2018-08-27 MED ORDER — LACTATED RINGERS IV SOLN: 1000.0000 mL | INTRAVENOUS | Status: DC

## 2018-08-27 MED ORDER — ARMC OPHTHALMIC DILATING DROPS
1.0000 "application " | OPHTHALMIC | Status: DC | PRN
  Administered 2018-08-27 (×3): 1 via OPHTHALMIC

## 2018-08-27 MED ORDER — ACETAMINOPHEN 160 MG/5ML PO SOLN: 325.0000 mg | ORAL | Status: DC | PRN

## 2018-08-27 SURGICAL SUPPLY — 27 items
CANNULA ANT/CHMB 27G (MISCELLANEOUS) ×1 IMPLANT
CANNULA ANT/CHMB 27GA (MISCELLANEOUS) ×3 IMPLANT
CARTRIDGE ABBOTT (MISCELLANEOUS) IMPLANT
GLOVE SURG LX 7.5 STRW (GLOVE) ×2
GLOVE SURG LX STRL 7.5 STRW (GLOVE) ×1 IMPLANT
GLOVE SURG TRIUMPH 8.0 PF LTX (GLOVE) ×3 IMPLANT
GOWN STRL REUS W/ TWL LRG LVL3 (GOWN DISPOSABLE) ×2 IMPLANT
GOWN STRL REUS W/TWL LRG LVL3 (GOWN DISPOSABLE) ×4
LENS IOL TECNIS ITEC 23.0 (Intraocular Lens) ×2 IMPLANT
MARKER SKIN DUAL TIP RULER LAB (MISCELLANEOUS) ×3 IMPLANT
NDL FILTER BLUNT 18X1 1/2 (NEEDLE) ×1 IMPLANT
NDL RETROBULBAR .5 NSTRL (NEEDLE) IMPLANT
NEEDLE FILTER BLUNT 18X 1/2SAF (NEEDLE) ×2
NEEDLE FILTER BLUNT 18X1 1/2 (NEEDLE) ×1 IMPLANT
PACK CATARACT BRASINGTON (MISCELLANEOUS) ×3 IMPLANT
PACK EYE AFTER SURG (MISCELLANEOUS) ×3 IMPLANT
PACK OPTHALMIC (MISCELLANEOUS) ×3 IMPLANT
RING MALYGIN 7.0 (MISCELLANEOUS) IMPLANT
SUT ETHILON 10-0 CS-B-6CS-B-6 (SUTURE)
SUT VICRYL  9 0 (SUTURE)
SUT VICRYL 9 0 (SUTURE) IMPLANT
SUTURE EHLN 10-0 CS-B-6CS-B-6 (SUTURE) IMPLANT
SYR 3ML LL SCALE MARK (SYRINGE) ×3 IMPLANT
SYR 5ML LL (SYRINGE) ×3 IMPLANT
SYR TB 1ML LUER SLIP (SYRINGE) ×3 IMPLANT
WATER STERILE IRR 500ML POUR (IV SOLUTION) ×3 IMPLANT
WIPE NON LINTING 3.25X3.25 (MISCELLANEOUS) ×3 IMPLANT

## 2018-08-27 NOTE — H&P (Signed)
Look in records - scanned.

## 2018-08-27 NOTE — H&P (Signed)
The History and Physical notes are on paper, have been signed, and are to be scanned. The patient remains stable and unchanged from the H&P.   Previous H&P reviewed, patient examined, and there are no changes.  Carol [name] 08/27/2018 10:29 AM

## 2018-08-27 NOTE — Transfer of Care (Signed)
Immediate Anesthesia Transfer of Care Note  Patient: Carol [name]  Procedure(s) Performed: CATARACT EXTRACTION PHACO AND INTRAOCULAR LENS PLACEMENT (IOC)  RIGHT (Right )  Patient Location: PACU  Anesthesia Type: MAC  Level of Consciousness: awake, alert  and patient cooperative  Airway and Oxygen Therapy: Patient Spontanous Breathing and Patient connected to supplemental oxygen  Post-op Assessment: Post-op Vital signs reviewed, Patient's Cardiovascular Status Stable, Respiratory Function Stable, Patent Airway and No signs of Nausea or vomiting  Post-op Vital Signs: Reviewed and stable  Complications: No apparent anesthesia complications

## 2018-08-27 NOTE — Op Note (Signed)
LOCATION:  [place]   PREOPERATIVE DIAGNOSIS:    Nuclear sclerotic cataract right eye. H25.11   POSTOPERATIVE DIAGNOSIS:  Nuclear sclerotic cataract right eye.     PROCEDURE:  Phacoemusification with posterior chamber intraocular lens placement of the right eye   LENS:   Implant Name Type Inv. Item Serial No. Manufacturer Lot No. LRB No. Used  LENS IOL DIOP 23.0 - [id number] Intraocular Lens LENS IOL DIOP 23.0 [id number]  Right 1        ULTRASOUND TIME: 19 % of 1 minutes, 2 seconds.  CDE 14.4   SURGEON:  Wyonia Hough, MD   ANESTHESIA:  Topical with tetracaine drops and 2% Xylocaine jelly, augmented with 1% preservative-free intracameral lidocaine.    COMPLICATIONS:  None.   DESCRIPTION OF PROCEDURE:  The patient was identified in the holding room and transported to the operating room and placed in the supine position under the operating microscope.  The right eye was identified as the operative eye and it was prepped and draped in the usual sterile ophthalmic fashion.   A 1 millimeter clear-corneal paracentesis was made at the 12:00 position.  0.5 ml of preservative-free 1% lidocaine was injected into the anterior chamber. The anterior chamber was filled with Viscoat viscoelastic.  A 2.4 millimeter keratome was used to make a near-clear corneal incision at the 9:00 position.  A curvilinear capsulorrhexis was made with a cystotome and capsulorrhexis forceps.  Balanced salt solution was used to hydrodissect and hydrodelineate the nucleus.   Phacoemulsification was then used in stop and chop fashion to remove the lens nucleus and epinucleus.  The remaining cortex was then removed using the irrigation and aspiration handpiece. Provisc was then placed into the capsular bag to distend it for lens placement.  A lens was then injected into the capsular bag.  The remaining viscoelastic was aspirated.   Wounds were hydrated with balanced salt solution.  The anterior  chamber was inflated to a physiologic pressure with balanced salt solution.  No wound leaks were noted. Cefuroxime 0.1 ml of a 10mg /ml solution was injected into the anterior chamber for a dose of 1 mg of intracameral antibiotic at the completion of the case.   Timolol and Brimonidine drops and erythromycin ointment were applied to the eye.  The patient was taken to the recovery room in stable condition without complications of anesthesia or surgery.   Tangala Wiegert 08/27/2018, 11:28 AM

## 2018-08-27 NOTE — Anesthesia Preprocedure Evaluation (Signed)
Anesthesia Evaluation  Patient identified by MRN, date of birth, ID band Patient awake    Reviewed: Allergy & Precautions, NPO status , Patient's Chart, lab work & pertinent test results  History of Anesthesia Complications Negative for: history of anesthetic complications  Airway Mallampati: III  TM Distance: >3 FB Neck ROM: Full    Dental  (+) Implants   Pulmonary former smoker (quit 2004),    Pulmonary exam normal breath sounds clear to auscultation       Cardiovascular Exercise Tolerance: Good negative cardio ROS Normal cardiovascular exam Rhythm:Regular Rate:Normal     Neuro/Psych negative neurological ROS     GI/Hepatic negative GI ROS,   Endo/Other  Hypothyroidism   Renal/GU negative Renal ROS     Musculoskeletal  (+) Arthritis , Osteoarthritis,  Fibromyalgia -  Abdominal   Peds  Hematology Breast CA   Anesthesia Other Findings   Reproductive/Obstetrics                             Anesthesia Physical Anesthesia Plan  ASA: II  Anesthesia Plan: MAC   Post-op Pain Management:    Induction: Intravenous  PONV Risk Score and Plan: 2 and TIVA and Midazolam  Airway Management Planned: Natural Airway  Additional Equipment:   Intra-op Plan:   Post-operative Plan:   Informed Consent: I have reviewed the patients History and Physical, chart, labs and discussed the procedure including the risks, benefits and alternatives for the proposed anesthesia with the patient or authorized representative who has indicated his/her understanding and acceptance.     Plan Discussed with: CRNA  Anesthesia Plan Comments:         Anesthesia Quick Evaluation

## 2018-08-27 NOTE — Anesthesia Postprocedure Evaluation (Signed)
Anesthesia Post Note  Patient: Carol [name]  Procedure(s) Performed: CATARACT EXTRACTION PHACO AND INTRAOCULAR LENS PLACEMENT (IOC)  RIGHT (Right )  Patient location during evaluation: PACU Anesthesia Type: MAC Level of consciousness: awake and alert, oriented and patient cooperative Pain management: pain level controlled Vital Signs Assessment: post-procedure vital signs reviewed and stable Respiratory status: spontaneous breathing, nonlabored ventilation and respiratory function stable Cardiovascular status: blood pressure returned to baseline and stable Postop Assessment: adequate PO intake Anesthetic complications: no    Darrin Nipper

## 2018-08-27 NOTE — Anesthesia Procedure Notes (Signed)
Procedure Name: [place] Date/Time: 08/27/2018 11:09 AM Performed by: Janna Arch, CRNA Pre-anesthesia Checklist: Patient identified, Emergency Drugs available, Suction available, Timeout performed and Patient being monitored Patient Re-evaluated:Patient Re-evaluated prior to induction Oxygen Delivery Method: Nasal cannula Placement Confirmation: positive ETCO2

## 2018-08-28 ENCOUNTER — Encounter: Payer: Self-pay | Admitting: Ophthalmology

## 2018-09-01 ENCOUNTER — Other Ambulatory Visit: Payer: Self-pay

## 2018-09-01 DIAGNOSIS — C50411 Malignant neoplasm of upper-outer quadrant of right female breast: Secondary | ICD-10-CM

## 2018-09-01 DIAGNOSIS — Z170 Estrogen receptor positive status [ER+]: Secondary | ICD-10-CM

## 2018-09-08 ENCOUNTER — Telehealth: Payer: Self-pay | Admitting: *Deleted

## 2018-09-08 ENCOUNTER — Telehealth: Payer: Self-pay | Admitting: Internal Medicine

## 2018-09-08 NOTE — Telephone Encounter (Signed)
Copied from [place] [phone]. Topic: General - Other >> Sep 08, 2018  2:45 PM Janace Aris A wrote: Reason for CRM: Pt called in wanting to speak to [facility] in regards to a prolia injection. I advised the pt that usually she will reach out to the pt when it is time to schedule for the injection, pt said she would like to talk to her about this instead/

## 2018-09-08 NOTE — Telephone Encounter (Signed)
Patient is not due for Prolia until 11/08/18, advised patient she voiced understanding.

## 2018-09-08 NOTE — Telephone Encounter (Signed)
Patient called and wanted to know why her mammogram was scheduled for 11/03/2018. She stated that Dr.Byrnett wanted her to have her mammogram in December. Patient stated that she has also met her out of pocket and wanted her mammogram and appointment with Dr.Byrnett in December.

## 2018-10-01 ENCOUNTER — Encounter: Payer: Self-pay | Admitting: Internal Medicine

## 2018-10-01 ENCOUNTER — Ambulatory Visit (INDEPENDENT_AMBULATORY_CARE_PROVIDER_SITE_OTHER): Payer: Medicare Other | Admitting: Internal Medicine

## 2018-10-01 VITALS — BP 126/72 | HR 88 | Temp 98.7°F | Resp 16 | Ht 63.0 in | Wt 224.6 lb

## 2018-10-01 DIAGNOSIS — G6289 Other specified polyneuropathies: Secondary | ICD-10-CM

## 2018-10-01 DIAGNOSIS — R7303 Prediabetes: Secondary | ICD-10-CM | POA: Diagnosis not present

## 2018-10-01 DIAGNOSIS — Z86010 Personal history of colonic polyps: Secondary | ICD-10-CM

## 2018-10-01 DIAGNOSIS — C50411 Malignant neoplasm of upper-outer quadrant of right female breast: Secondary | ICD-10-CM

## 2018-10-01 DIAGNOSIS — D126 Benign neoplasm of colon, unspecified: Secondary | ICD-10-CM

## 2018-10-01 DIAGNOSIS — Z170 Estrogen receptor positive status [ER+]: Secondary | ICD-10-CM

## 2018-10-01 DIAGNOSIS — M85852 Other specified disorders of bone density and structure, left thigh: Secondary | ICD-10-CM | POA: Diagnosis not present

## 2018-10-01 NOTE — Progress Notes (Signed)
Patient ID: Carol [name], female    DOB: 12/14/1945  Age: 72 y.o. MRN: [id number]  The patient is here for follow up and  management of other chronic and acute problems.    Cc:  She feels generally well.  Since her last visit she is   S/p partal mastectomy and XRT right sided breast CA. Left breast mass was a cyst that resolved spontaneously.    S/p Cataract surgery bilateral,  colonoscopy done   5  Tubular adenomas  < 5 mm June 2019  TBreast cancer:  The lump in left breast disappeared had a right lumpectomy  follow up next  Year with radiology. Taking letrazole and tolerating medication relatively well.   Obeisty:  Gaining weight,  Not exercising  Right hand fingers going numb  , affecting the thumb to 4th,  Has been intermittently present since age 54. But states that the symptoms are  getting worse . Has history of CTS diagnosed at age 39 but not treated   managed with braces at night   wants to have a TAH/BSO bc of her breast cancer   Had a sore arm from the flu shot   Has had 1 of 2 shingles vaccines   History Mayla has a past medical history of Allergic rhinitis, cause unspecified, Arthritis, Breast cancer ([place]) (2019), Bronchitis, Cataract, Fibromyalgia, HLD (hyperlipidemia), Hypothyroidism, Menieres disease (2011), Other abnormal glucose, Papanicolaou smear of cervix with high grade squamous intraepithelial lesion (HGSIL), Personal history of colonic polyps, Personal history of radiation therapy (2019), Pre-diabetes, Presence of dental prosthetic device, Shingles, Swelling of limb, Thyroid goiter, Tobacco use disorder, and Unspecified sinusitis (chronic).   She has a past surgical history that includes Appendectomy (06/1997); External ear surgery (09/2008); Colposcopy (10/16/2011); Colonoscopy (10/15/2012); Colonoscopy w/ biopsies (03/30/2004); Mastectomy, partial (Right, 12/23/2017); Sentinel node biopsy (Right, 12/23/2017); Breast lumpectomy (Right, 2019); Colonoscopy  with propofol (N/A, 03/19/2018); Breast biopsy (Right, 2002); Breast biopsy (Right, 2011); Breast excisional biopsy (Right, 12/23/2017); Cataract extraction w/PHACO (Left, 07/30/2018); and Cataract extraction w/PHACO (Right, 08/27/2018).   Her family history includes Arthritis in her unknown relative; Breast cancer in her maternal aunt and maternal grandmother; Coronary artery disease in her unknown relative; Depression in her sister; Diabetes in her brother and brother; Heart disease in her brother and brother; Heart failure in her brother and father; Hyperlipidemia in her brother, father, and sister; Hypertension in her brother, father, sister, and sister; Stroke in her father.She reports that she quit smoking about 15 years ago. Her smoking use included cigarettes. She has a 40.00 pack-year smoking history. She has never used smokeless tobacco. She reports current alcohol use. She reports that she does not use drugs.  Outpatient Medications Prior to Visit  Medication Sig Dispense Refill  . Ascorbic Acid (VITAMIN C) 1000 MG tablet Take 1,000 mg by mouth daily.    Marland Kitchen aspirin 81 MG tablet Take 81 mg by mouth daily.     Marland Kitchen atorvastatin (LIPITOR) 20 MG tablet TAKE 1 TABLET DAILY 90 tablet 1  . cetirizine (ZYRTEC) 10 MG tablet Take 10 mg by mouth every morning.     . Cholecalciferol (VITAMIN D3) 2000 UNITS capsule Take 2,000 Units by mouth daily.      . Coenzyme Q10 (COQ-10) 200 MG CAPS Take 200 mg by mouth daily.    . fluticasone (FLONASE) 50 MCG/ACT nasal spray Place 2 sprays into both nostrils daily. (Patient taking differently: Place 2 sprays into both nostrils daily as needed for allergies. ) 16 g 6  .  furosemide (LASIX) 20 MG tablet TAKE 1 TABLET TWICE A DAY 180 tablet 1  . Glucosamine Sulfate 1000 MG CAPS Take 1,000 mg by mouth daily.     Marland Kitchen letrozole (FEMARA) 2.5 MG tablet Take 1 tablet (2.5 mg total) by mouth daily. 90 tablet 2  . levothyroxine (SYNTHROID, LEVOTHROID) 50 MCG tablet TAKE 1 TABLET  DAILY 90 tablet 0  . Misc Natural Products (OSTEO BI-FLEX JOINT SHIELD) TABS Take 2 tablets by mouth daily. With magnesium     . Multiple Vitamin (MULTIVITAMIN) tablet Take 1 tablet by mouth daily.    . Olopatadine HCl (PATADAY OP) Place 1 drop into both eyes daily as needed (allergies).    . Polyvinyl Alcohol-Povidone (REFRESH OP) Place 1 drop into both eyes daily as needed (dry eyes).    Marland Kitchen rOPINIRole (REQUIP) 0.5 MG tablet Take 1 tablet (0.5 mg total) by mouth at bedtime. 90 tablet 1  . senna (SENOKOT) 8.6 MG tablet Take 1-6 tablets by mouth at bedtime.      No facility-administered medications prior to visit.     Review of Systems   Patient denies headache, fevers, malaise, unintentional weight loss, skin rash, eye pain, sinus congestion and sinus pain, sore throat, dysphagia,  hemoptysis , cough, dyspnea, wheezing, chest pain, palpitations, orthopnea, edema, abdominal pain, nausea, melena, diarrhea, constipation, flank pain, dysuria, hematuria, urinary  Frequency, nocturia, numbness, tingling, seizures,  Focal weakness, Loss of consciousness,  Tremor, insomnia, depression, anxiety, and suicidal ideation.      Objective:  BP 126/72 (BP Location: Left Arm, Patient Position: Sitting, Cuff Size: Large)   Pulse 88   Temp 98.7 F (37.1 C) (Oral)   Resp 16   Ht 5\' 3"  (1.6 m)   Wt 224 lb 9.6 oz (101.9 kg)   SpO2 94%   BMI 39.79 kg/m   Physical Exam  General appearance: alert, cooperative and appears stated age Head: Normocephalic, without obvious abnormality, atraumatic Eyes: conjunctivae/corneas clear. PERRL, EOM's intact. Fundi benign. Ears: normal TM's and external ear canals both ears Nose: Nares normal. Septum midline. Mucosa normal. No drainage or sinus tenderness. Throat: lips, mucosa, and tongue normal; teeth and gums normal Neck: no adenopathy, no carotid bruit, no JVD, supple, symmetrical, trachea midline and thyroid not enlarged, symmetric, no  tenderness/mass/nodules Lungs: clear to auscultation bilaterally Breasts: right lumpectomy scar,  Well healed,  Otherwise normal appearance, no masses or tenderness Heart: regular rate and rhythm, S1, S2 normal, no murmur, click, rub or gallop Abdomen: soft, non-tender; bowel sounds normal; no masses,  no organomegaly Extremities: extremities normal, atraumatic, no cyanosis or edema Pulses: 2+ and symmetric Skin: Skin color, texture, turgor normal. No rashes or lesions Neurologic: Alert and oriented X 3, normal strength and tone. Normal symmetric reflexes. Normal coordination and gait.  positive phalen's test ,  Right hand    Assessment & Plan:   Problem List Items Addressed This Visit    History of colonic polyps    5 year follow up will be due in 2024      Malignant neoplasm of upper-outer quadrant of right female breast [facility])    S/p partial mastectomy,  XRT ,oncognenic testing ,  Now on Letrozole.       Osteopenia    Managed with Prolia due to use of Letrozole. Next dose due in Jan 2020      Prediabetes - Primary    Borderline diabetic, recommended weight loss,strict low carbohydrate diet, and exercise. Lab Results  Component Value Date   HGBA1C  6.4 10/03/2018          Relevant Orders   Microalbumin / creatinine urine ratio (Completed)   Lipid panel   Tubular adenoma of colon    Two by 2019 colonoscopy.  5 year follow up planned        Other Visit Diagnoses    Other polyneuropathy       Relevant Orders   TSH   Vitamin B12   Ambulatory referral to Neurology   RBC Folate   RPR     A total of 40 minutes was spent with patient more than half of which was spent in counseling patient on the above mentioned issues , reviewing and explaining recent labs and imaging studies done, and coordination of care.  I am having Gladis Riffle maintain her Glucosamine Sulfate, OSTEO BI-FLEX JOINT SHIELD, Vitamin D3, multivitamin, fluticasone, aspirin, cetirizine, vitamin  C, CoQ-10, senna, Olopatadine HCl (PATADAY OP), Polyvinyl Alcohol-Povidone (REFRESH OP), rOPINIRole, atorvastatin, letrozole, furosemide, and levothyroxine.  No orders of the defined types were placed in this encounter.   There are no discontinued medications.  Follow-up: No follow-ups on file.   Crecencio Mc, MD

## 2018-10-01 NOTE — Patient Instructions (Addendum)
I will make the Referral for nerve conduction studies to confirm diagnosis of carpal tunnel syndrome in the right wrist In February   I recommend Servando Salina MD as your  Gynecologist,  She is i In [facility]  I recommend  Karle Barr for dermatology   Return for fasting labs aSAP  Prediabetes Eating Plan Prediabetes is a condition that causes blood sugar (glucose) levels to be higher than normal. This increases the risk for developing diabetes. In order to prevent diabetes from developing, your health care provider may recommend a diet and other lifestyle changes to help you:  Control your blood glucose levels.  Improve your cholesterol levels.  Manage your blood pressure. Your health care provider may recommend working with a diet and nutrition specialist (dietitian) to make a meal plan that is best for you. What are tips for following this plan? Lifestyle  Set weight loss goals with the help of your health care team. It is recommended that most people with prediabetes lose 7% of their current body weight.  Exercise for at least 30 minutes at least 5 days a week.  Attend a support group or seek ongoing support from a mental health counselor.  Take over-the-counter and prescription medicines only as told by your health care provider. Reading food labels  Read food labels to check the amount of fat, salt (sodium), and sugar in prepackaged foods. Avoid foods that have: ? Saturated fats. ? Trans fats. ? Added sugars.  Avoid foods that have more than 300 milligrams (mg) of sodium per serving. Limit your daily sodium intake to less than 2,300 mg each day. Shopping  Avoid buying pre-made and processed foods. Cooking  Cook with olive oil. Do not use butter, lard, or ghee.  Bake, broil, grill, or boil foods. Avoid frying. Meal planning   Work with your dietitian to develop an eating plan that is right for you. This may include: ? Tracking how many calories  you take in. Use a food diary, notebook, or mobile application to track what you eat at each meal. ? Using the glycemic index (GI) to plan your meals. The index tells you how quickly a food will raise your blood glucose. Choose low-GI foods. These foods take a longer time to raise blood glucose.  Consider following a Mediterranean diet. This diet includes: ? Several servings each day of fresh fruits and vegetables. ? Eating fish at least twice a week. ? Several servings each day of whole grains, beans, nuts, and seeds. ? Using olive oil instead of other fats. ? Moderate alcohol consumption. ? Eating small amounts of red meat and whole-fat dairy.  If you have high blood pressure, you may need to limit your sodium intake or follow a diet such as the DASH eating plan. DASH is an eating plan that aims to lower high blood pressure. What foods are recommended? The items listed below may not be a complete list. Talk with your dietitian about what dietary choices are best for you. Grains Whole grains, such as whole-wheat or whole-grain breads, crackers, cereals, and pasta. Unsweetened oatmeal. Bulgur. Barley. Quinoa. Brown rice. Corn or whole-wheat flour tortillas or taco shells. Vegetables Lettuce. Spinach. Peas. Beets. Cauliflower. Cabbage. Broccoli. Carrots. Tomatoes. Squash. Eggplant. Herbs. Peppers. Onions. Cucumbers. Brussels sprouts. Fruits Berries. Bananas. Apples. Oranges. Grapes. Papaya. Mango. Pomegranate. Kiwi. Grapefruit. Cherries. Meats and other protein foods Seafood. Poultry without skin. Lean cuts of pork and beef. Tofu. Eggs. Nuts. Beans. Dairy Low-fat or fat-free dairy products,  such as yogurt, cottage cheese, and cheese. Beverages Water. Tea. Coffee. Sugar-free or diet soda. Seltzer water. Lowfat or no-fat milk. Milk alternatives, such as soy or almond milk. Fats and oils Olive oil. Canola oil. Sunflower oil. Grapeseed oil. Avocado. Walnuts. Sweets and desserts Sugar-free or  low-fat pudding. Sugar-free or low-fat ice cream and other frozen treats. Seasoning and other foods Herbs. Sodium-free spices. Mustard. Relish. Low-fat, low-sugar ketchup. Low-fat, low-sugar barbecue sauce. Low-fat or fat-free mayonnaise. What foods are not recommended? The items listed below may not be a complete list. Talk with your dietitian about what dietary choices are best for you. Grains Refined white flour and flour products, such as bread, pasta, snack foods, and cereals. Vegetables Canned vegetables. Frozen vegetables with butter or cream sauce. Fruits Fruits canned with syrup. Meats and other protein foods Fatty cuts of meat. Poultry with skin. Breaded or fried meat. Processed meats. Dairy Full-fat yogurt, cheese, or milk. Beverages Sweetened drinks, such as sweet iced tea and soda. Fats and oils Butter. Lard. Ghee. Sweets and desserts Baked goods, such as cake, cupcakes, pastries, cookies, and cheesecake. Seasoning and other foods Spice mixes with added salt. Ketchup. Barbecue sauce. Mayonnaise. Summary  To prevent diabetes from developing, you may need to make diet and other lifestyle changes to help control blood sugar, improve cholesterol levels, and manage your blood pressure.  Set weight loss goals with the help of your health care team. It is recommended that most people with prediabetes lose 7 percent of their current body weight.  Consider following a Mediterranean diet that includes plenty of fresh fruits and vegetables, whole grains, beans, nuts, seeds, fish, lean meat, low-fat dairy, and healthy oils. This information is not intended to replace advice given to you by your health care provider. Make sure you discuss any questions you have with your health care provider. Document Released: 02/15/2015 Document Revised: 12/05/2016 Document Reviewed: 12/05/2016 Elsevier Interactive Patient Education  2019 [employer] Maintenance for Postmenopausal  Women Menopause is a normal process in which your reproductive ability comes to an end. This process happens gradually over a span of months to years, usually between the ages of 4 and 20. Menopause is complete when you have missed 12 consecutive menstrual periods. It is important to talk with your health care provider about some of the most common conditions that affect postmenopausal women, such as heart disease, cancer, and bone loss (osteoporosis). Adopting a healthy lifestyle and getting preventive care can help to promote your health and wellness. Those actions can also lower your chances of developing some of these common conditions. What should I know about menopause? During menopause, you may experience a number of symptoms, such as:  Moderate-to-severe hot flashes.  Night sweats.  Decrease in sex drive.  Mood swings.  Headaches.  Tiredness.  Irritability.  Memory problems.  Insomnia. Choosing to treat or not to treat menopausal changes is an individual decision that you make with your health care provider. What should I know about hormone replacement therapy and supplements? Hormone therapy products are effective for treating symptoms that are associated with menopause, such as hot flashes and night sweats. Hormone replacement carries certain risks, especially as you become older. If you are thinking about using estrogen or estrogen with progestin treatments, discuss the benefits and risks with your health care provider. What should I know about heart disease and stroke? Heart disease, heart attack, and stroke become more likely as you age. This may be due, in part, to  the hormonal changes that your body experiences during menopause. These can affect how your body processes dietary fats, triglycerides, and cholesterol. Heart attack and stroke are both medical emergencies. There are many things that you can do to help prevent heart disease and stroke:  Have your blood pressure  checked at least every 1-2 years. High blood pressure causes heart disease and increases the risk of stroke.  If you are 60-53 years old, ask your health care provider if you should take aspirin to prevent a heart attack or a stroke.  Do not use any tobacco products, including cigarettes, chewing tobacco, or electronic cigarettes. If you need help quitting, ask your health care provider.  It is important to eat a healthy diet and maintain a healthy weight. ? Be sure to include plenty of vegetables, fruits, low-fat dairy products, and lean protein. ? Avoid eating foods that are high in solid fats, added sugars, or salt (sodium).  Get regular exercise. This is one of the most important things that you can do for your health. ? Try to exercise for at least 150 minutes each week. The type of exercise that you do should increase your heart rate and make you sweat. This is known as moderate-intensity exercise. ? Try to do strengthening exercises at least twice each week. Do these in addition to the moderate-intensity exercise.  Know your numbers.Ask your health care provider to check your cholesterol and your blood glucose. Continue to have your blood tested as directed by your health care provider.  What should I know about cancer screening? There are several types of cancer. Take the following steps to reduce your risk and to catch any cancer development as early as possible. Breast Cancer  Practice breast self-awareness. ? This means understanding how your breasts normally appear and feel. ? It also means doing regular breast self-exams. Let your health care provider know about any changes, no matter how small.  If you are 28 or older, have a clinician do a breast exam (clinical breast exam or CBE) every year. Depending on your age, family history, and medical history, it may be recommended that you also have a yearly breast X-ray (mammogram).  If you have a family history of breast cancer,  talk with your health care provider about genetic screening.  If you are at high risk for breast cancer, talk with your health care provider about having an MRI and a mammogram every year.  Breast cancer (BRCA) gene test is recommended for women who have family members with BRCA-related cancers. Results of the assessment will determine the need for genetic counseling and BRCA1 and for BRCA2 testing. BRCA-related cancers include these types: ? Breast. This occurs in males or females. ? Ovarian. ? Tubal. This may also be called fallopian tube cancer. ? Cancer of the abdominal or pelvic lining (peritoneal cancer). ? Prostate. ? Pancreatic. Cervical, Uterine, and Ovarian Cancer Your health care provider may recommend that you be screened regularly for cancer of the pelvic organs. These include your ovaries, uterus, and vagina. This screening involves a pelvic exam, which includes checking for microscopic changes to the surface of your cervix (Pap test).  For women ages 21-65, health care providers may recommend a pelvic exam and a Pap test every three years. For women ages 64-65, they may recommend the Pap test and pelvic exam, combined with testing for human papilloma virus (HPV), every five years. Some types of HPV increase your risk of cervical cancer. Testing for HPV may also  be done on women of any age who have unclear Pap test results.  Other health care providers may not recommend any screening for nonpregnant women who are considered low risk for pelvic cancer and have no symptoms. Ask your health care provider if a screening pelvic exam is right for you.  If you have had past treatment for cervical cancer or a condition that could lead to cancer, you need Pap tests and screening for cancer for at least 20 years after your treatment. If Pap tests have been discontinued for you, your risk factors (such as having a new sexual partner) need to be reassessed to determine if you should start having  screenings again. Some women have medical problems that increase the chance of getting cervical cancer. In these cases, your health care provider may recommend that you have screening and Pap tests more often.  If you have a family history of uterine cancer or ovarian cancer, talk with your health care provider about genetic screening.  If you have vaginal bleeding after reaching menopause, tell your health care provider.  There are currently no reliable tests available to screen for ovarian cancer. Lung Cancer Lung cancer screening is recommended for adults 60-85 years old who are at high risk for lung cancer because of a history of smoking. A yearly low-dose CT scan of the lungs is recommended if you:  Currently smoke.  Have a history of at least 30 pack-years of smoking and you currently smoke or have quit within the past 15 years. A pack-year is smoking an average of one pack of cigarettes per day for one year. Yearly screening should:  Continue until it has been 15 years since you quit.  Stop if you develop a health problem that would prevent you from having lung cancer treatment. Colorectal Cancer  This type of cancer can be detected and can often be prevented.  Routine colorectal cancer screening usually begins at age 1 and continues through age 59.  If you have risk factors for colon cancer, your health care provider may recommend that you be screened at an earlier age.  If you have a family history of colorectal cancer, talk with your health care provider about genetic screening.  Your health care provider may also recommend using home test kits to check for hidden blood in your stool.  A small camera at the end of a tube can be used to examine your colon directly (sigmoidoscopy or colonoscopy). This is done to check for the earliest forms of colorectal cancer.  Direct examination of the colon should be repeated every 5-10 years until age 67. However, if early forms of  precancerous polyps or small growths are found or if you have a family history or genetic risk for colorectal cancer, you may need to be screened more often. Skin Cancer  Check your skin from head to toe regularly.  Monitor any moles. Be sure to tell your health care provider: ? About any new moles or changes in moles, especially if there is a change in a mole's shape or color. ? If you have a mole that is larger than the size of a pencil eraser.  If any of your family members has a history of skin cancer, especially at a young age, talk with your health care provider about genetic screening.  Always use sunscreen. Apply sunscreen liberally and repeatedly throughout the day.  Whenever you are outside, protect yourself by wearing long sleeves, pants, a wide-brimmed hat, and sunglasses. What  should I know about osteoporosis? Osteoporosis is a condition in which bone destruction happens more quickly than new bone creation. After menopause, you may be at an increased risk for osteoporosis. To help prevent osteoporosis or the bone fractures that can happen because of osteoporosis, the following is recommended:  If you are 33-35 years old, get at least 1,000 mg of calcium and at least 600 mg of vitamin D per day.  If you are older than age 9 but younger than age 46, get at least 1,200 mg of calcium and at least 600 mg of vitamin D per day.  If you are older than age 2, get at least 1,200 mg of calcium and at least 800 mg of vitamin D per day. Smoking and excessive alcohol intake increase the risk of osteoporosis. Eat foods that are rich in calcium and vitamin D, and do weight-bearing exercises several times each week as directed by your health care provider. What should I know about how menopause affects my mental health? Depression may occur at any age, but it is more common as you become older. Common symptoms of depression include:  Low or sad mood.  Changes in sleep patterns.  Changes  in appetite or eating patterns.  Feeling an overall lack of motivation or enjoyment of activities that you previously enjoyed.  Frequent crying spells. Talk with your health care provider if you think that you are experiencing depression. What should I know about immunizations? It is important that you get and maintain your immunizations. These include:  Tetanus, diphtheria, and pertussis (Tdap) booster vaccine.  Influenza every year before the flu season begins.  Pneumonia vaccine.  Shingles vaccine. Your health care provider may also recommend other immunizations. This information is not intended to replace advice given to you by your health care provider. Make sure you discuss any questions you have with your health care provider. Document Released: 11/23/2005 Document Revised: 04/20/2016 Document Reviewed: 07/05/2015 Elsevier Interactive Patient Education  2019 [employer].

## 2018-10-03 ENCOUNTER — Other Ambulatory Visit (INDEPENDENT_AMBULATORY_CARE_PROVIDER_SITE_OTHER): Payer: Medicare Other

## 2018-10-03 DIAGNOSIS — E782 Mixed hyperlipidemia: Secondary | ICD-10-CM | POA: Diagnosis not present

## 2018-10-03 DIAGNOSIS — R7303 Prediabetes: Secondary | ICD-10-CM

## 2018-10-03 DIAGNOSIS — E559 Vitamin D deficiency, unspecified: Secondary | ICD-10-CM | POA: Diagnosis not present

## 2018-10-03 DIAGNOSIS — E039 Hypothyroidism, unspecified: Secondary | ICD-10-CM | POA: Diagnosis not present

## 2018-10-03 LAB — COMPREHENSIVE METABOLIC PANEL
ALT: 34 U/L (ref 0–35)
AST: 24 U/L (ref 0–37)
Albumin: 4.2 g/dL (ref 3.5–5.2)
Alkaline Phosphatase: 52 U/L (ref 39–117)
BUN: 11 mg/dL (ref 6–23)
CO2: 31 mEq/L (ref 19–32)
Calcium: 9.3 mg/dL (ref 8.4–10.5)
Chloride: 104 mEq/L (ref 96–112)
Creatinine, Ser: 0.91 mg/dL (ref 0.40–1.20)
GFR: 64.68 mL/min (ref >60.00)
Glucose, Bld: 150 mg/dL — ABNORMAL HIGH (ref 70–99)
Potassium: 4.3 mEq/L (ref 3.5–5.1)
Sodium: 143 mEq/L (ref 135–145)
Total Bilirubin: 0.7 mg/dL (ref 0.2–1.2)
Total Protein: 6.8 g/dL (ref 6.0–8.3)

## 2018-10-03 LAB — HEMOGLOBIN A1C: Hgb A1c MFr Bld: 6.4 % (ref 4.6–6.5)

## 2018-10-03 LAB — MICROALBUMIN / CREATININE URINE RATIO
Creatinine,U: 190.5 mg/dL
Microalb Creat Ratio: 0.5 mg/g (ref 0.0–30.0)
Microalb, Ur: 0.9 mg/dL (ref 0.0–1.9)

## 2018-10-03 LAB — VITAMIN D 25 HYDROXY (VIT D DEFICIENCY, FRACTURES): VITD: 52.03 ng/mL (ref 30.00–100.00)

## 2018-10-03 LAB — LIPID PANEL
Cholesterol: 135 mg/dL (ref 0–200)
HDL: 47.4 mg/dL (ref >39.00)
LDL Cholesterol: 61 mg/dL (ref 0–99)
NonHDL: 87.1
Total CHOL/HDL Ratio: 3
Triglycerides: 129 mg/dL (ref 0.0–149.0)
VLDL: 25.8 mg/dL (ref 0.0–40.0)

## 2018-10-03 LAB — TSH: TSH: 3.63 u[IU]/mL (ref 0.35–4.50)

## 2018-10-04 ENCOUNTER — Encounter: Payer: Self-pay | Admitting: Internal Medicine

## 2018-10-04 DIAGNOSIS — D126 Benign neoplasm of colon, unspecified: Secondary | ICD-10-CM | POA: Insufficient documentation

## 2018-10-04 NOTE — Assessment & Plan Note (Signed)
Two by 2019 colonoscopy.  5 year follow up planned

## 2018-10-04 NOTE — Assessment & Plan Note (Signed)
Managed with Prolia due to use of Letrozole. Next dose due in Jan 2020

## 2018-10-04 NOTE — Assessment & Plan Note (Addendum)
S/p partial mastectomy,  XRT ,oncognenic testing ,  Now on Letrozole.

## 2018-10-04 NOTE — Assessment & Plan Note (Signed)
5 year follow up will be due in 2024

## 2018-10-04 NOTE — Assessment & Plan Note (Signed)
Borderline diabetic, recommended weight loss,strict low carbohydrate diet, and exercise. Lab Results  Component Value Date   HGBA1C 6.4 10/03/2018

## 2018-10-13 ENCOUNTER — Ambulatory Visit
Admission: RE | Admit: 2018-10-13 | Discharge: 2018-10-13 | Disposition: A | Discharge status: Home / Self Care | Payer: Medicare Other | Source: Ambulatory Visit | Attending: General Surgery | Admitting: General Surgery

## 2018-10-13 DIAGNOSIS — C50411 Malignant neoplasm of upper-outer quadrant of right female breast: Secondary | ICD-10-CM

## 2018-10-13 DIAGNOSIS — Z170 Estrogen receptor positive status [ER+]: Secondary | ICD-10-CM | POA: Insufficient documentation

## 2018-10-14 ENCOUNTER — Ambulatory Visit: Payer: Medicare Other | Admitting: General Surgery

## 2018-10-19 ENCOUNTER — Other Ambulatory Visit: Payer: Self-pay | Admitting: Internal Medicine

## 2018-10-19 DIAGNOSIS — E7800 Pure hypercholesterolemia, unspecified: Secondary | ICD-10-CM

## 2018-10-30 NOTE — Telephone Encounter (Signed)
Pt calling back to inquire about her prolia.injection. Since it is due 11/08/2018, has it been approved by the insurance company?  Pt would like a call back so she can get scheduled.

## 2018-10-31 ENCOUNTER — Other Ambulatory Visit: Payer: Self-pay | Admitting: Internal Medicine

## 2018-10-31 DIAGNOSIS — G2581 Restless legs syndrome: Secondary | ICD-10-CM

## 2018-11-03 ENCOUNTER — Other Ambulatory Visit: Payer: Medicare Other

## 2018-11-03 ENCOUNTER — Ambulatory Visit: Payer: Medicare Other

## 2018-11-03 NOTE — Telephone Encounter (Signed)
Prolia injection certification has placed with [employer]. Awaiting approval from insurance.

## 2018-11-13 ENCOUNTER — Ambulatory Visit (INDEPENDENT_AMBULATORY_CARE_PROVIDER_SITE_OTHER): Payer: Medicare Other

## 2018-11-13 DIAGNOSIS — M85852 Other specified disorders of bone density and structure, left thigh: Secondary | ICD-10-CM | POA: Diagnosis not present

## 2018-11-13 MED ORDER — DENOSUMAB 60 MG/ML ~~LOC~~ SOSY
60.0000 mg | PREFILLED_SYRINGE | Freq: Once | SUBCUTANEOUS | Status: AC
  Administered 2018-11-13: 60 mg via SUBCUTANEOUS

## 2018-11-13 NOTE — Progress Notes (Signed)
Patient came in today for prolia injection in left arm. Patient tolerated well.

## 2018-11-20 ENCOUNTER — Telehealth: Payer: Self-pay | Admitting: Internal Medicine

## 2018-11-20 ENCOUNTER — Encounter: Payer: Self-pay | Admitting: Internal Medicine

## 2018-11-20 DIAGNOSIS — Z9189 Other specified personal risk factors, not elsewhere classified: Secondary | ICD-10-CM | POA: Insufficient documentation

## 2018-11-20 NOTE — Assessment & Plan Note (Signed)
Patient has osteopenia and breast cancer and is taking hormone suppression therapy.  prolia advised

## 2018-11-25 ENCOUNTER — Other Ambulatory Visit: Payer: Self-pay | Admitting: Internal Medicine

## 2018-12-23 ENCOUNTER — Encounter: Payer: Self-pay | Admitting: General Surgery

## 2018-12-23 ENCOUNTER — Other Ambulatory Visit: Payer: Self-pay

## 2018-12-23 ENCOUNTER — Ambulatory Visit (INDEPENDENT_AMBULATORY_CARE_PROVIDER_SITE_OTHER): Payer: Medicare Other | Admitting: General Surgery

## 2018-12-23 VITALS — BP 160/107 | HR 85 | Temp 97.9°F | Ht 62.0 in | Wt 225.0 lb

## 2018-12-23 DIAGNOSIS — C50411 Malignant neoplasm of upper-outer quadrant of right female breast: Secondary | ICD-10-CM | POA: Diagnosis not present

## 2018-12-23 DIAGNOSIS — Z170 Estrogen receptor positive status [ER+]: Secondary | ICD-10-CM

## 2018-12-23 NOTE — Progress Notes (Signed)
Patient ID: [name], female   DOB: [birth year], 73 y.o.   MRN: [id number]  Chief Complaint  Patient presents with  . Follow-up    mammogram     HPI Mosella Kasa is a 73 y.o. female who presents for a breast evaluation. The most recent mammogram was done on 10/14/2019.  Patient does perform regular self breast checks and gets regular mammograms done.    HPI  Past Medical History:  Diagnosis Date  . Allergic rhinitis, cause unspecified   . Arthritis    hands  . Breast cancer ([place]) 2019   8 mm ER/PR positive, HER-2/neu not overexpressed.  Oncotype score 14 (4% chance of recurrence on antiestrogen alone)  . Bronchitis    h/o  . Cataract   . Fibromyalgia   . HLD (hyperlipidemia)   . Hypothyroidism   . 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 Other abnormal glucose   . 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 Personal history of colonic polyps   . Personal history of radiation therapy 2019   f/u right breast cancer  . Pre-diabetes   . Presence of dental prosthetic device    implants - top and bottom  . Shingles   . Swelling of limb   . Thyroid goiter   . Tobacco use disorder   . Unspecified sinusitis (chronic)     Past Surgical History:  Procedure Laterality Date  . APPENDECTOMY  06/1997  . BREAST BIOPSY Right 11/05/2000    core with clip/neg  . BREAST BIOPSY Right 2011   benign, clip placed   . BREAST BIOPSY Right 12/02/2017   positive  . CATARACT EXTRACTION W/PHACO Left 07/30/2018   Procedure: CATARACT EXTRACTION PHACO AND INTRAOCULAR LENS PLACEMENT ([place]) LEFT;  Surgeon: Leandrew Koyanagi, MD;  Location: [facility];  Service: Ophthalmology;  Laterality: Left;  . CATARACT EXTRACTION W/PHACO Right 08/27/2018   Procedure: CATARACT EXTRACTION PHACO AND INTRAOCULAR LENS PLACEMENT ([place])  RIGHT;  Surgeon: Leandrew Koyanagi, MD;  Location: [facility];  Service: Ophthalmology;  Laterality: Right;   . COLONOSCOPY  10/15/2012   mutliple polyps.  Outlaw in [place].  Repeat 5 years.  . COLONOSCOPY W/ BIOPSIES  03/30/2004   Adenomatous polyp of the cecum, hyperplastic polyps of the splenic flexure, upper rectum and mid rectum.  . COLONOSCOPY WITH PROPOFOL N/A 03/19/2018   Tubular adenoma x2 without atypia.  Follow-up 2024.  Surgeon: Robert Bellow, MD;  Location: [facility];  Service: Endoscopy;  Laterality: N/A;  . COLPOSCOPY  10/16/2011   HGSIL pap smear.  Negative.  Marland Kitchen EXTERNAL EAR SURGERY  09/2008   external - not specified  (RIGHT)  . MASTECTOMY, PARTIAL Right 12/23/2017   T1b, N0; 8 mm ER: 100%; PR: 100%; Her 2 neu not overexpressed.  MammoSite placement.  Surgeon: Robert Bellow, MD;  Location: [facility];  Service: General;  Laterality: Right;  . SENTINEL NODE BIOPSY Right 12/23/2017   Procedure: SENTINEL NODE BIOPSY;  Surgeon: Robert Bellow, MD;  Location: [facility];  Service: General;  Laterality: Right;    Family History  Problem Relation Age of Onset  . Heart failure Father   . Hypertension Father   . Stroke Father   . Hyperlipidemia Father   . Heart failure Brother   . Diabetes Brother   . Heart disease Brother   . Diabetes Brother   . Heart disease Brother   . Hypertension Sister   . Depression Sister   .  Hypertension Sister   . Hyperlipidemia Sister   . Arthritis Other   . Coronary artery disease Other   . Hyperlipidemia Brother   . Hypertension Brother   . Breast cancer Maternal Aunt   . Breast cancer Maternal Grandmother     Social History Social History   Tobacco Use  . Smoking status: Former Smoker    Packs/day: 1.00    Years: 40.00    Pack years: 40.00    Types: Cigarettes    Last attempt to quit: 04/02/2003    Years since quitting: 15.7  . Smokeless tobacco: Never Used  . Tobacco comment: 7-10 cigarettes for past 40 years   Substance Use Topics  . Alcohol use: Yes    Comment: rarely  . Drug use: No    No Known  Allergies  Current Outpatient Medications  Medication Sig Dispense Refill  . Ascorbic Acid (VITAMIN C) 1000 MG tablet Take 1,000 mg by mouth daily.    Marland Kitchen aspirin 81 MG tablet Take 81 mg by mouth daily.     Marland Kitchen atorvastatin (LIPITOR) 20 MG tablet TAKE 1 TABLET DAILY 90 tablet 4  . cetirizine (ZYRTEC) 10 MG tablet Take 10 mg by mouth every morning.     . Cholecalciferol (VITAMIN D3) 2000 UNITS capsule Take 2,000 Units by mouth daily.      . Coenzyme Q10 (COQ-10) 200 MG CAPS Take 200 mg by mouth daily.    . fluticasone (FLONASE) 50 MCG/ACT nasal spray Place 2 sprays into both nostrils daily. (Patient taking differently: Place 2 sprays into both nostrils daily as needed for allergies. ) 16 g 6  . furosemide (LASIX) 20 MG tablet TAKE 1 TABLET TWICE A DAY 180 tablet 1  . Glucosamine Sulfate 1000 MG CAPS Take 1,000 mg by mouth daily.     Marland Kitchen letrozole (FEMARA) 2.5 MG tablet Take 1 tablet (2.5 mg total) by mouth daily. 90 tablet 2  . levothyroxine (SYNTHROID, LEVOTHROID) 50 MCG tablet TAKE 1 TABLET DAILY 90 tablet 1  . Misc Natural Products (OSTEO BI-FLEX JOINT SHIELD) TABS Take 2 tablets by mouth daily. With magnesium     . Multiple Vitamin (MULTIVITAMIN) tablet Take 1 tablet by mouth daily.    . Olopatadine HCl (PATADAY OP) Place 1 drop into both eyes daily as needed (allergies).    . Polyvinyl Alcohol-Povidone (REFRESH OP) Place 1 drop into both eyes daily as needed (dry eyes).    Marland Kitchen rOPINIRole (REQUIP) 0.5 MG tablet TAKE 1 TABLET AT BEDTIME 90 tablet 4  . senna (SENOKOT) 8.6 MG tablet Take 1-6 tablets by mouth at bedtime.      No current facility-administered medications for this visit.     Review of Systems Review of Systems  Blood pressure (!) 160/107, pulse 85, temperature 97.9 F (36.6 C), temperature source Skin, height 5' 2" (1.575 m), weight 225 lb (102.1 kg), SpO2 98 %.  Physical Exam Physical Exam Exam conducted with a chaperone present.  Constitutional:      Appearance: She is  well-developed.  Eyes:     General: No scleral icterus.    Conjunctiva/sclera: Conjunctivae normal.  Neck:     Musculoskeletal: Neck supple.  Cardiovascular:     Rate and Rhythm: Normal rate and regular rhythm.     Heart sounds: Normal heart sounds.  Pulmonary:     Effort: Pulmonary effort is normal.     Breath sounds: Normal breath sounds.  Chest:     Breasts:  Right: No inverted nipple, mass, nipple discharge, skin change or tenderness.        Left: No inverted nipple, mass, nipple discharge, skin change or tenderness.    Lymphadenopathy:     Cervical: No cervical adenopathy.  Skin:    General: Skin is warm and dry.  Neurological:     Mental Status: She is alert and oriented to person, place, and time.     Data Reviewed Bilateral diagnostic mammograms of October 13, 2018 were reviewed.  Oval fat density in the retroareolar aspect of the right breast.  Ultrasound show this being 0.6 cm in maximal diameter without internal vascular flow.  BI-RADS 3.  84-monthfollow-up recommended.  In light of the May 2019 exam where the area measured 1.2 cm and the decrease to 0.6 cm I do not believe that a 323-monthollow-up is needed.  The texture, clinical exam improvement and significant decrease in volume over serial ultrasounds suggest a benign reactive process.  Colonoscopy completed March 19, 2018 showed tubular adenomas x2 without atypia.  A 5-year follow-up exam is planned.  Assessment Doing well now just 1 year out from management of her right breast cancer.  Plan  The patient has been asked to return to the office in nine months with a bilateral diagnostic mammogram. The patient is aware to call back for any questions or concerns.   HPI, Physical Exam, Assessment and Plan have been scribed under the direction and in the presence of JeHervey ArdMD.  JeGaspar ColaCMA  I have completed the exam and reviewed the above documentation for accuracy and completeness.  I  agree with the above.  DrHaematologistas been used and any errors in dictation or transcription are unintentional.  JeHervey ArdM.D., F.A.C.S.   JeForest Gleasonyrnett 12/23/2018, 2:22 PM

## 2018-12-23 NOTE — Patient Instructions (Signed)
The patient has been asked to return to the office in nine months with a bilateral diagnostic mammogram. The patient is aware to call back for any questions or concerns.

## 2019-01-08 ENCOUNTER — Other Ambulatory Visit: Payer: Self-pay | Admitting: Internal Medicine

## 2019-02-20 ENCOUNTER — Other Ambulatory Visit: Payer: Self-pay | Admitting: Internal Medicine

## 2019-02-20 DIAGNOSIS — I878 Other specified disorders of veins: Secondary | ICD-10-CM

## 2019-04-29 ENCOUNTER — Telehealth: Payer: Self-pay | Admitting: Internal Medicine

## 2019-04-29 NOTE — Telephone Encounter (Signed)
Insurance verification for Prolia filed on [employer] Portal. 

## 2019-05-06 NOTE — Telephone Encounter (Signed)
Notified patient PA for Prolia  In process.  Einar Grad Key: [id number] - PA Case ID: [id number]

## 2019-05-13 NOTE — Telephone Encounter (Signed)
FHQRFX:[id number];IYMEBR:[id number];Review Type:Prior Auth;Coverage Start Date:04/13/2019;Coverage End Date:05/12/2020;

## 2019-05-13 NOTE — Telephone Encounter (Signed)
Patient scheduled for Prolia.

## 2019-05-14 ENCOUNTER — Other Ambulatory Visit: Payer: Self-pay

## 2019-05-14 ENCOUNTER — Ambulatory Visit (INDEPENDENT_AMBULATORY_CARE_PROVIDER_SITE_OTHER): Payer: Medicare Other

## 2019-05-14 DIAGNOSIS — M85852 Other specified disorders of bone density and structure, left thigh: Secondary | ICD-10-CM | POA: Diagnosis not present

## 2019-05-14 MED ORDER — DENOSUMAB 60 MG/ML ~~LOC~~ SOSY
60.0000 mg | PREFILLED_SYRINGE | Freq: Once | SUBCUTANEOUS | Status: AC
  Administered 2019-05-14: 60 mg via SUBCUTANEOUS

## 2019-05-14 NOTE — Progress Notes (Signed)
Patient presented today for Prolia injection. Administered SQ in right arm.  Patient tolerated well with no signs of distress.

## 2019-05-24 ENCOUNTER — Other Ambulatory Visit: Payer: Self-pay | Admitting: Internal Medicine

## 2019-06-08 IMAGING — MG MM DIGITAL DIAGNOSTIC BILAT W/ TOMO W/ CAD
8 of 15 series · 8 of 35 positions shown · non-contrast
Comparison: Previous exam(s).

CLINICAL DATA: Callback from screening mammogram for possible mass
bilateral breasts.

EXAM:
2D DIGITAL DIAGNOSTIC BILATERAL MAMMOGRAM WITH ADJUNCT TOMO
ULTRASOUND BILATERAL BREAST

[R MLO]
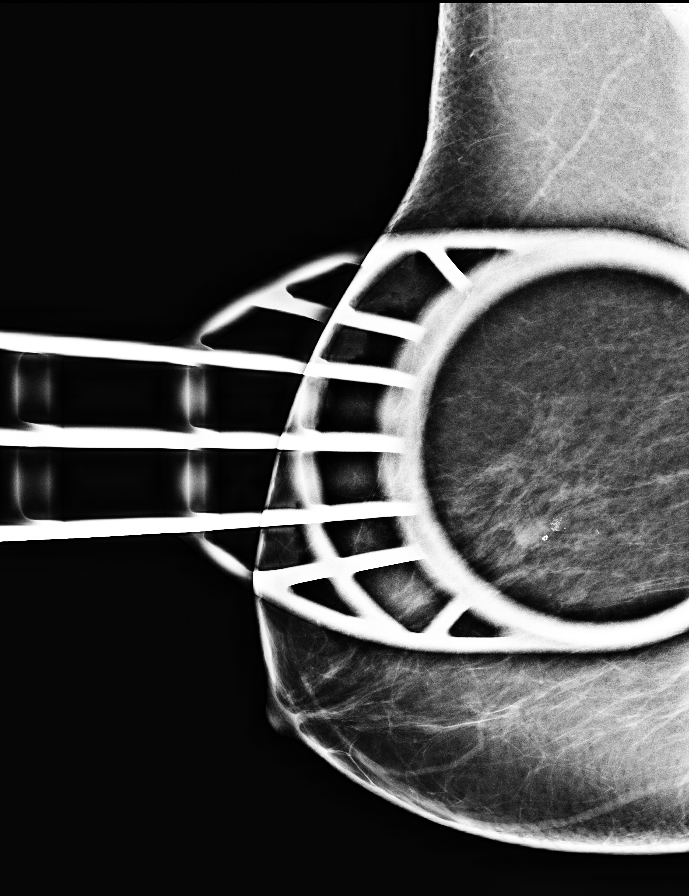

[R CC]
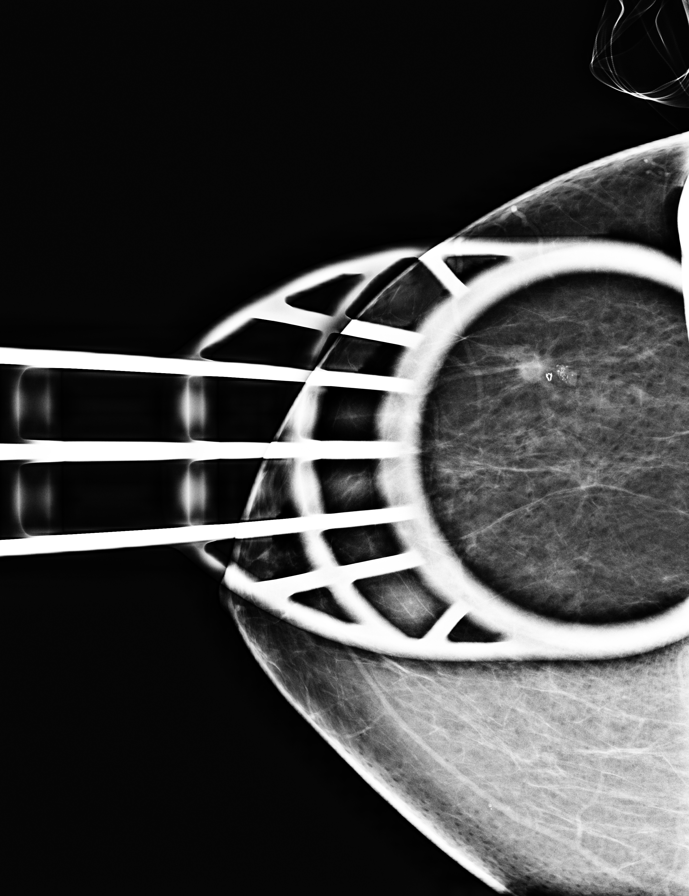

[L MLO synth-2D (1 of 2)]
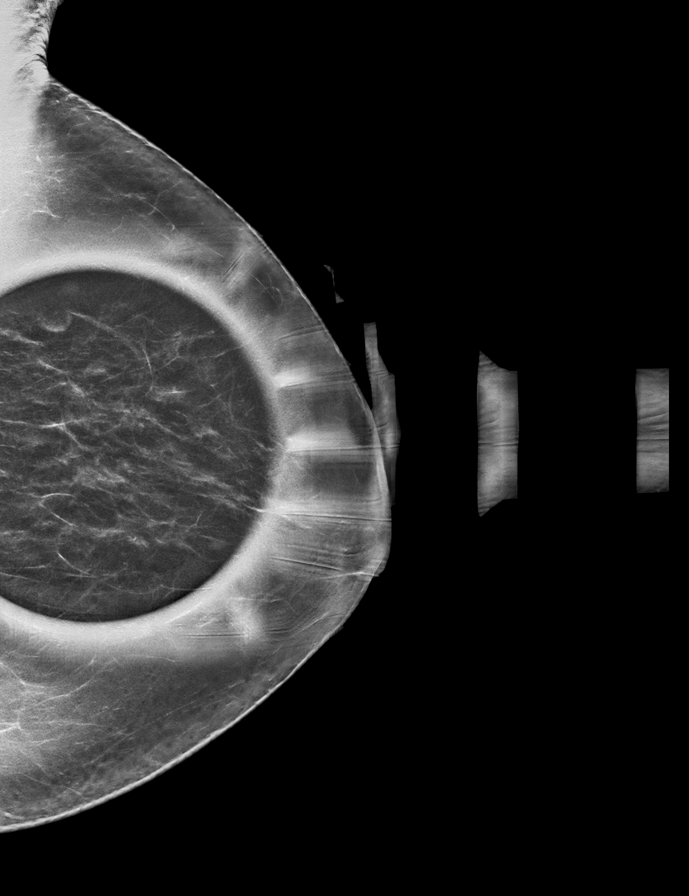

[L MLO (1 of 2)]
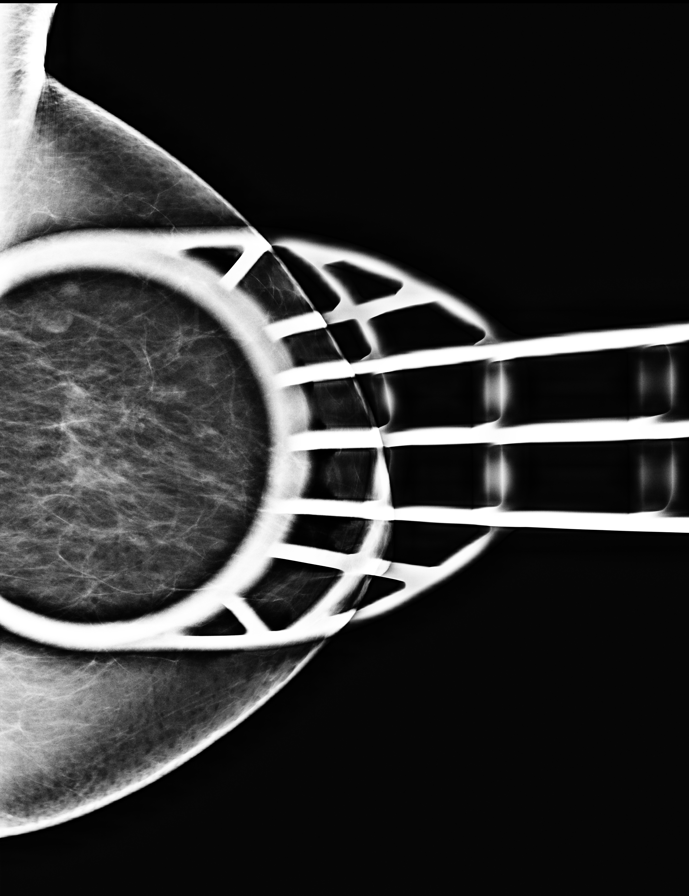

[L MLO (2 of 2)]
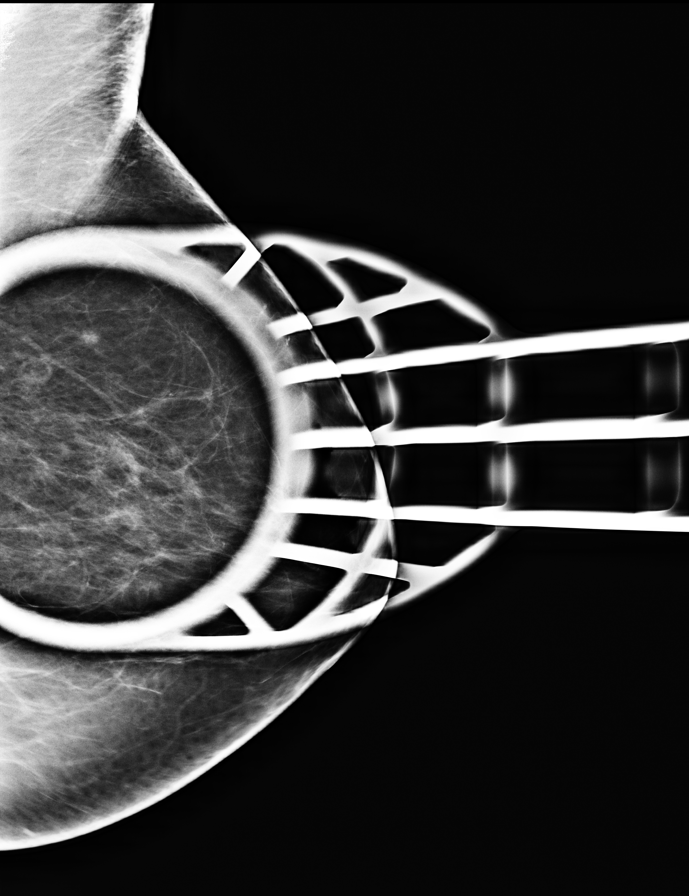

[L MLO synth-2D (2 of 2)]
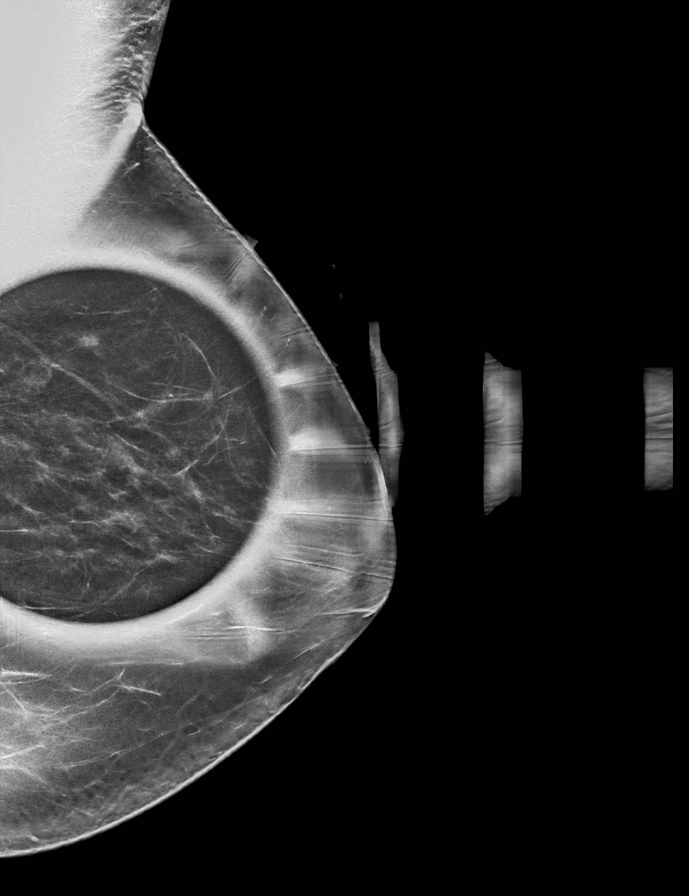

[L CC]
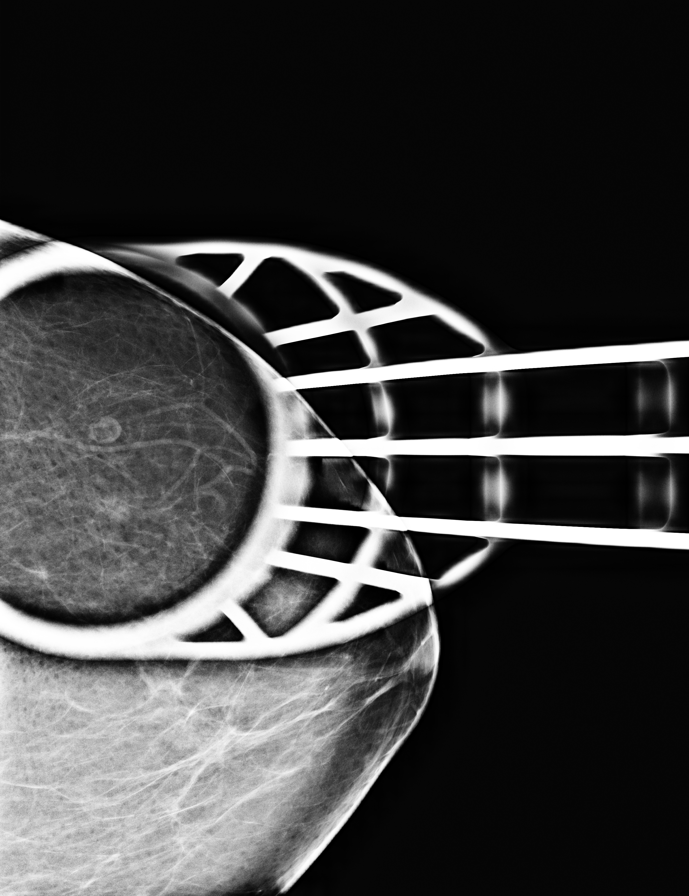

[R MLO synth-2D]
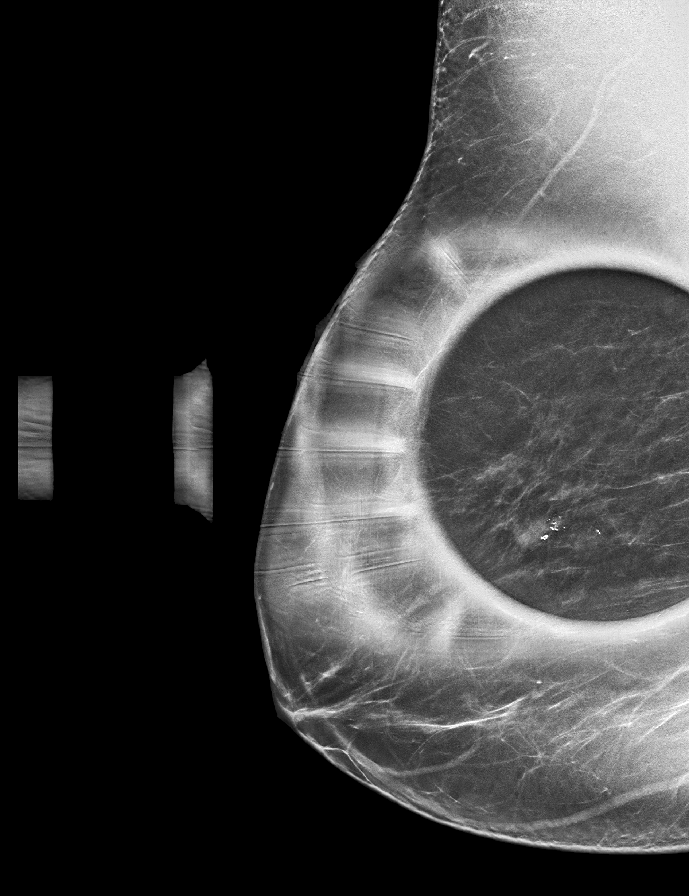

[8 of 35 positions shown; findings below may reference images not displayed]

ACR Breast Density Category b: There are scattered areas of
fibroglandular density.
FINDINGS: Spot compression CC and MLO views of bilateral breasts are
submitted. The previously questioned mass at the right breast 9
o'clock persists and appears spiculated with associated
calcification and biopsy clip. The previously noted mass in the
upper-outer quadrant left breast persists on additional views. The
mass in the left breast is unchanged compared to prior mammogram February 01, 2016.

Targeted ultrasound is performed, showing angulated hypoechoic
lesion at the right breast 9 o'clock 6 cm from nipple measuring
x 0.8 x 0.7 cm correlating to the mammographic mass. Ultrasound of
the right axilla is negative.

Ultrasound of the left breast demonstrate a minimal complicated cyst
at the left breast 12:30 o'clock 7 cm from nipple measuring 0.4 cm.
There is a normal intramammary lymph node at the left breast 2:30
o'clock 5 cm from nipple measuring 0.6 cm. One of these findings may
correlate to the mammographic finding. No other focal discrete
cystic or solid lesion is identified in the upper-outer quadrant
left breast. Ultrasound of the left axilla is negative.
IMPRESSION: Highly suspicious findings.

RECOMMENDATION:
Recommend ultrasound-guided core biopsy of right breast 9 o'clock
mass. Also recommend six-month follow-up left breast mammogram and
ultrasound. In 6 months, the patient would have 2 year stability is
established for the left breast finding.

I have discussed the findings and recommendations with the patient.
Results were also provided in writing at the conclusion of the
visit. If applicable, a reminder letter will be sent to the patient
regarding the next appointment.

BI-RADS CATEGORY  5: Highly suggestive of malignancy.

## 2019-07-15 ENCOUNTER — Other Ambulatory Visit: Payer: Self-pay

## 2019-07-15 DIAGNOSIS — R2231 Localized swelling, mass and lump, right upper limb: Secondary | ICD-10-CM

## 2019-07-20 ENCOUNTER — Ambulatory Visit: Payer: Medicare Other | Admitting: Surgery

## 2019-07-23 ENCOUNTER — Other Ambulatory Visit: Payer: Self-pay | Admitting: Surgery

## 2019-07-23 ENCOUNTER — Ambulatory Visit
Admission: RE | Admit: 2019-07-23 | Discharge: 2019-07-23 | Disposition: A | Discharge status: Home / Self Care | Payer: Medicare Other | Source: Ambulatory Visit | Attending: Surgery | Admitting: Surgery

## 2019-07-23 DIAGNOSIS — N6310 Unspecified lump in the right breast, unspecified quadrant: Secondary | ICD-10-CM | POA: Insufficient documentation

## 2019-07-23 DIAGNOSIS — R2231 Localized swelling, mass and lump, right upper limb: Secondary | ICD-10-CM

## 2019-07-23 DIAGNOSIS — R928 Other abnormal and inconclusive findings on diagnostic imaging of breast: Secondary | ICD-10-CM

## 2019-07-23 DIAGNOSIS — Z853 Personal history of malignant neoplasm of breast: Secondary | ICD-10-CM | POA: Insufficient documentation

## 2019-07-24 ENCOUNTER — Telehealth: Payer: Self-pay

## 2019-07-24 NOTE — Telephone Encounter (Signed)
-----   Message from Jules Husbands, MD sent at 07/24/2019  7:35 AM EDT ----- Please make sure we arrange u/s guided right breast biopsy, please make sure pt is aware.  ----- Message ----- From: Interface, Rad Results In Sent: 07/23/2019   5:01 PM EDT To: Jules Husbands, MD

## 2019-07-24 NOTE — Telephone Encounter (Signed)
Call to patient to make her aware that Dr Dahlia Byes wants her to go ahead and have [facility] do her biopsy of the right breast. She is aware to call us back to reschedule her follow up appointment with Dr Dahlia Byes for 3-4 days after her biopsy once this is scheduled.

## 2019-07-27 ENCOUNTER — Telehealth: Payer: Self-pay | Admitting: Internal Medicine

## 2019-07-27 DIAGNOSIS — N6310 Unspecified lump in the right breast, unspecified quadrant: Secondary | ICD-10-CM

## 2019-07-27 NOTE — Telephone Encounter (Signed)
Spoke with pt and she stated that she would prefer to stay with Dr. Bary Castilla and would like a referral sent over to him for the breast biopsy.

## 2019-07-27 NOTE — Telephone Encounter (Signed)
Referral made 

## 2019-07-27 NOTE — Telephone Encounter (Signed)
Pt has mammogram on 07/23/2019 at [facility] breast center and the mammogram was inconclusive. Pt needs a right biopsy in the exact location they inserted the balloon when she had radiation. Dr Bary Castilla is no longer there and pt does not want to go  back to [facility] breast center.pt would dr Derrel Nip recommendation.

## 2019-07-29 ENCOUNTER — Ambulatory Visit: Payer: Medicare Other | Admitting: Surgery

## 2019-08-22 ENCOUNTER — Other Ambulatory Visit: Payer: Self-pay | Admitting: Internal Medicine

## 2019-09-02 ENCOUNTER — Other Ambulatory Visit: Payer: Self-pay

## 2019-09-03 ENCOUNTER — Ambulatory Visit
Admission: RE | Admit: 2019-09-03 | Discharge: 2019-09-03 | Disposition: A | Discharge status: Home / Self Care | Payer: Medicare Other | Source: Ambulatory Visit | Attending: Radiation Oncology | Admitting: Radiation Oncology

## 2019-09-03 ENCOUNTER — Other Ambulatory Visit: Payer: Self-pay

## 2019-09-03 ENCOUNTER — Encounter: Payer: Self-pay | Admitting: Radiation Oncology

## 2019-09-03 VITALS — BP 120/67 | HR 86 | Temp 98.2°F | Resp 18 | Wt 231.9 lb

## 2019-09-03 DIAGNOSIS — Z170 Estrogen receptor positive status [ER+]: Secondary | ICD-10-CM | POA: Insufficient documentation

## 2019-09-03 DIAGNOSIS — Z923 Personal history of irradiation: Secondary | ICD-10-CM | POA: Diagnosis not present

## 2019-09-03 DIAGNOSIS — Z79811 Long term (current) use of aromatase inhibitors: Secondary | ICD-10-CM | POA: Insufficient documentation

## 2019-09-03 DIAGNOSIS — C50411 Malignant neoplasm of upper-outer quadrant of right female breast: Secondary | ICD-10-CM | POA: Diagnosis not present

## 2019-09-03 NOTE — Progress Notes (Signed)
Radiation Oncology Follow up Note  Name: Carol [name]   Date:   09/03/2019 MRN:  [id number] DOB: [birth year]    This 73 y.o. female presents to the clinic today for 61-month follow-up status post whole accelerated partial breast radiation to her right breast for stage I ER/PR positive invasive mammary carcinoma.  REFERRING PROVIDER: Crecencio Mc, MD  HPI: Patient is a 73 year old female now at 54 months having completed accelerated partial breast radiation to her right breast for stage I ER/PR positive invasive mammary carcinoma.  Seen today in routine follow-up she has felt a little knot in her lumpectomy scar.  She has had mammograms back.  In October showing irregular mass in the right breast near the patient's prior lumpectomy site suspicious for malignancy.  Patient has been seen by Dr. Tollie Pizza who is comfortable following this with a 63-month repeat of her mammograms and ultrasound.  She is currently on letrozole tolerating that well without side effect.  COMPLICATIONS OF TREATMENT: none  FOLLOW UP COMPLIANCE: keeps appointments   PHYSICAL EXAM:  BP 120/67 (BP Location: Left Arm, Patient Position: Sitting)   Pulse 86   Temp 98.2 F (36.8 C) (Tympanic)   Resp 18   Wt 231 lb 14.4 oz (105.2 kg)   BMI 42.42 kg/m  There is slight thickening the area lumpectomy site which is consistent with prior high-dose-rate remote afterloading treatment.  No other dominant mass or nodularity is noted in either breast in 2 positions examined.  No axillary or supraclavicular adenopathy is identified.  Well-developed well-nourished patient in NAD. HEENT reveals PERLA, EOMI, discs not visualized.  Oral cavity is clear. No oral mucosal lesions are identified. Neck is clear without evidence of cervical or supraclavicular adenopathy. Lungs are clear to A&P. Cardiac examination is essentially unremarkable with regular rate and rhythm without murmur rub or thrill. Abdomen is benign with no  organomegaly or masses noted. Motor sensory and DTR levels are equal and symmetric in the upper and lower extremities. Cranial nerves II through XII are grossly intact. Proprioception is intact. No peripheral adenopathy or edema is identified. No motor or sensory levels are noted. Crude visual fields are within normal range.  RADIOLOGY RESULTS: Mammogram and ultrasound reviewed consistent with above-stated findings.  PLAN: At this time I believe we are dealing with most likely changes affected by high-dose-rate remote afterloading secondary to accelerated partial breast radiation.  Dr. Tollie Pizza is following her in the 31-month basis he will biopsy the site should it be significantly changed on his next examination.  I am comfortable with that follow-up.  She continues on letrozole without side effect.  I have asked to see her back in 1 year for follow-up.  Patient is comfortable with my recommendations and assessment.  W I would like to take this opportunity to thank you for allowing me to participate in the care of your patient.Noreene Filbert, MD

## 2019-10-19 IMAGING — US US BREAST*L* LIMITED INC AXILLA
1 series · 13 of 13 positions shown · non-contrast
Comparison: Previous exam(s).

CLINICAL DATA: Callback from screening mammogram for possible mass
bilateral breasts.

EXAM:
2D DIGITAL DIAGNOSTIC BILATERAL MAMMOGRAM WITH ADJUNCT TOMO
ULTRASOUND BILATERAL BREAST

[Series 1: us breast*left* limited inc axilla · 0.07mm/px · 13 of 13 slices shown]
[im 1/13]
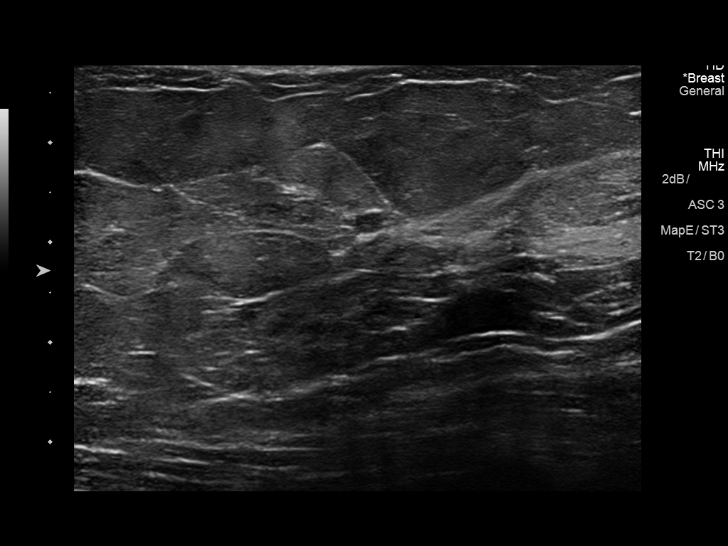
[im 2/13]
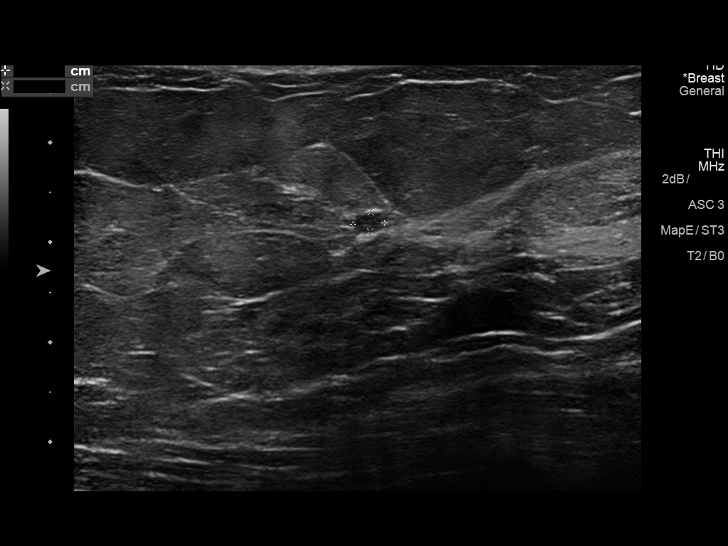
[im 3/13]
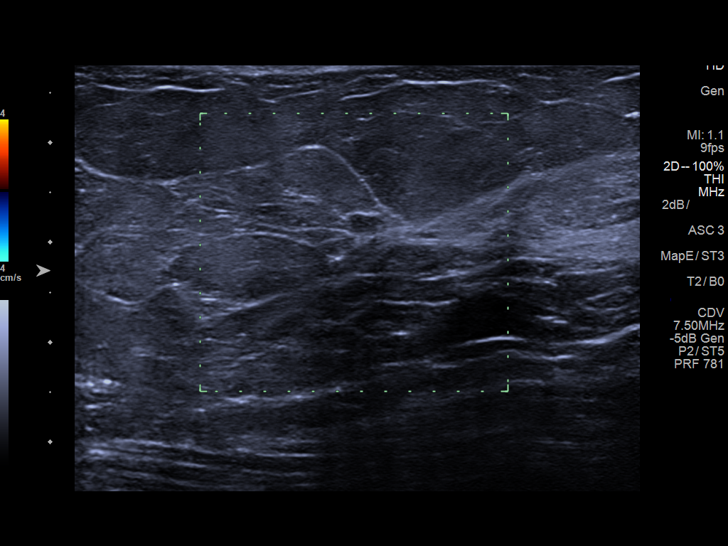
[im 4/13]
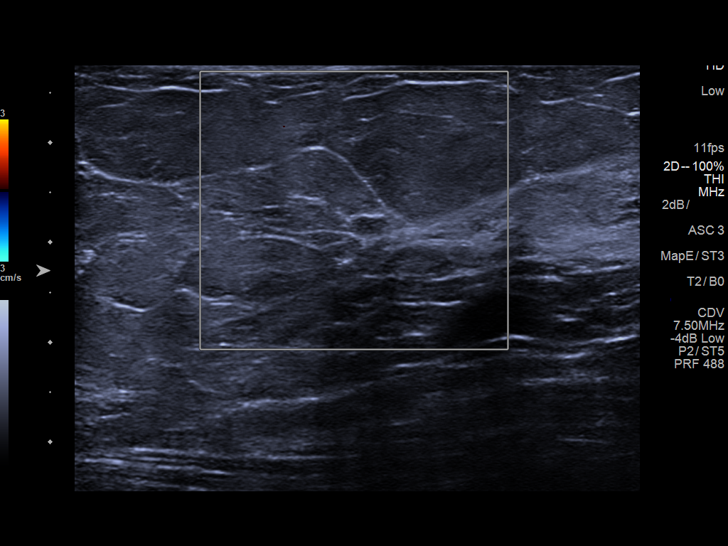
[im 5/13]
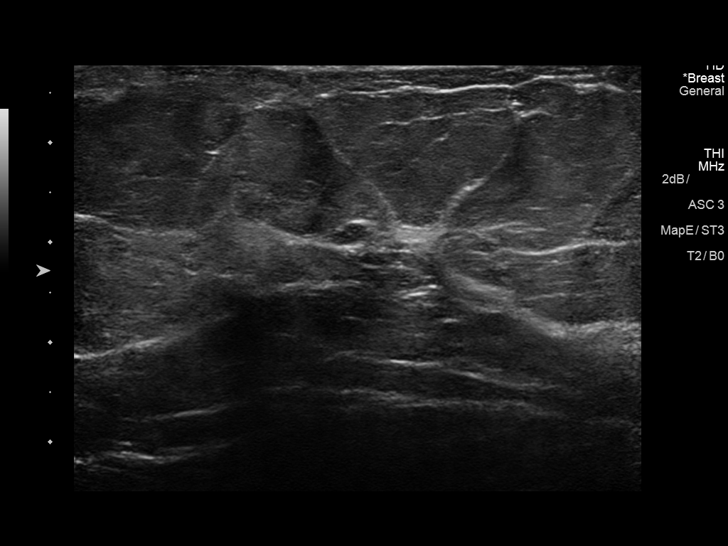
[im 6/13]
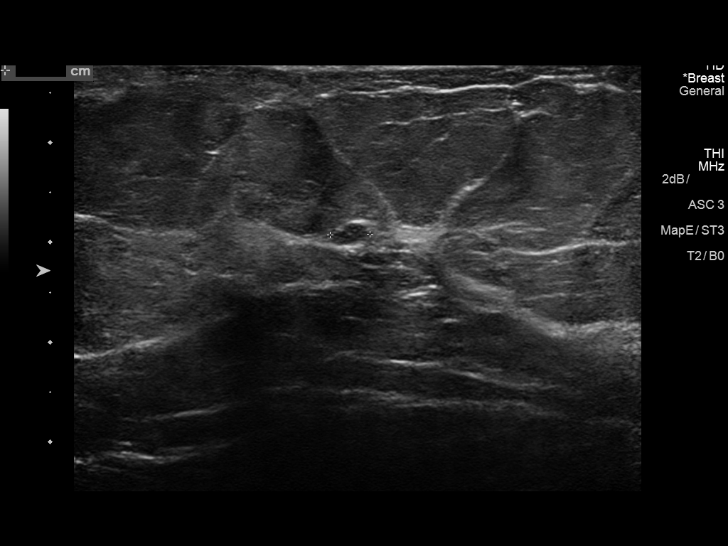
[im 7/13]
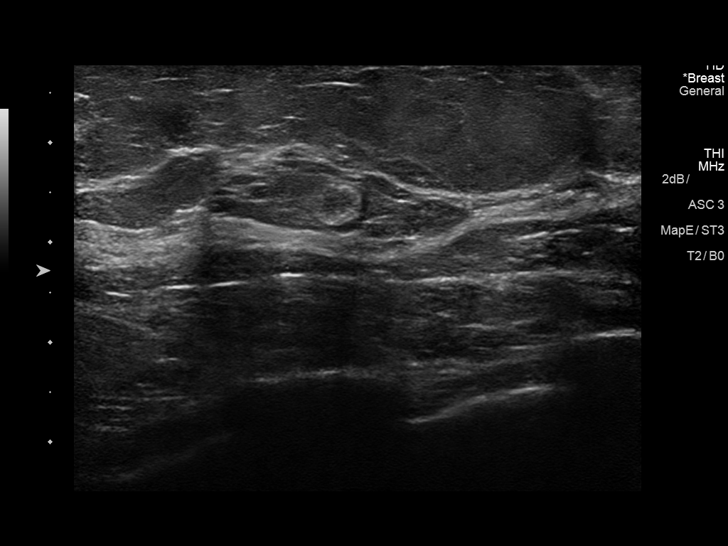
[im 8/13]
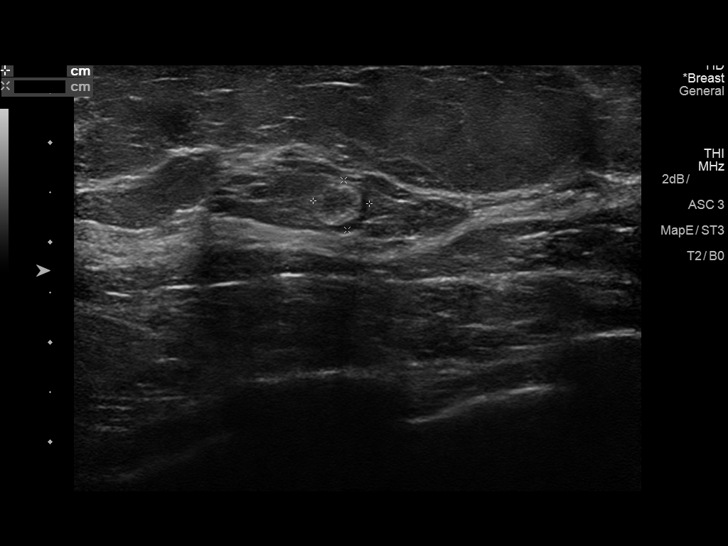
[im 9/13]
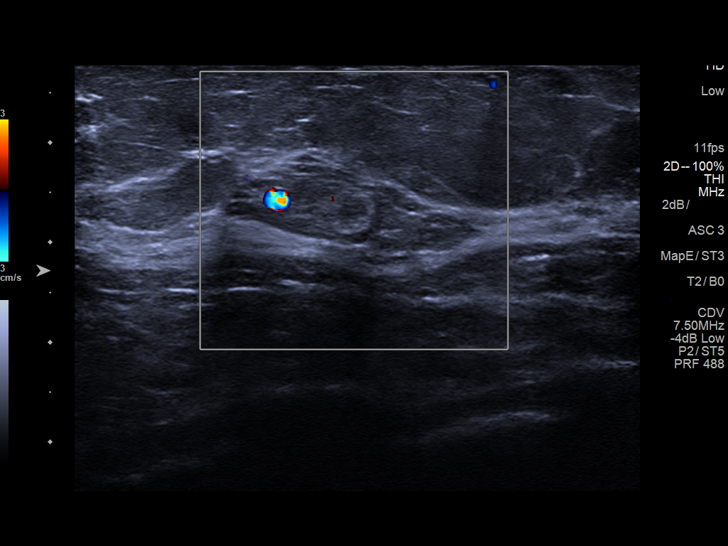
[im 10/13]
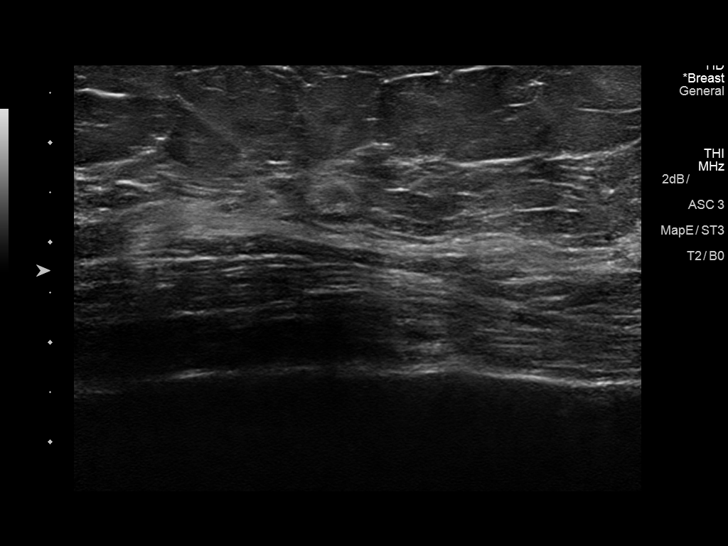
[im 11/13]
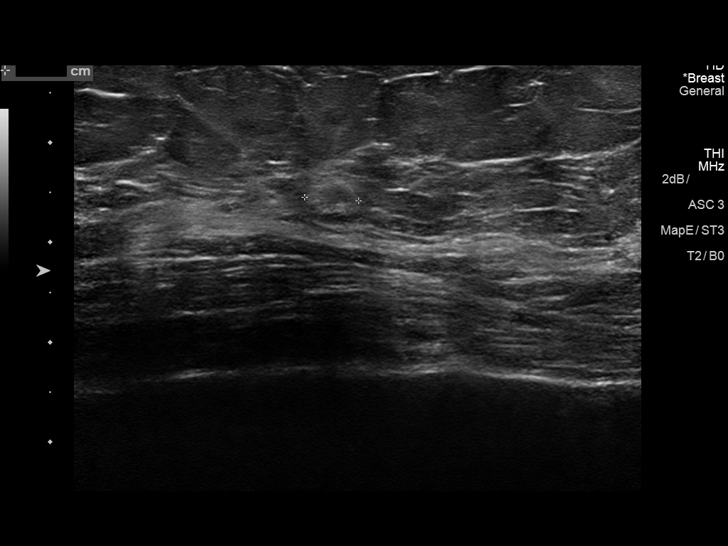
[im 12/13]
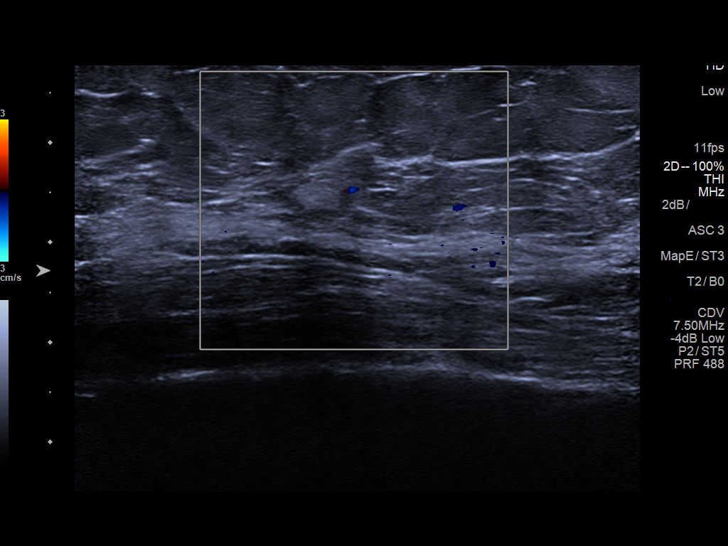
[im 13/13]
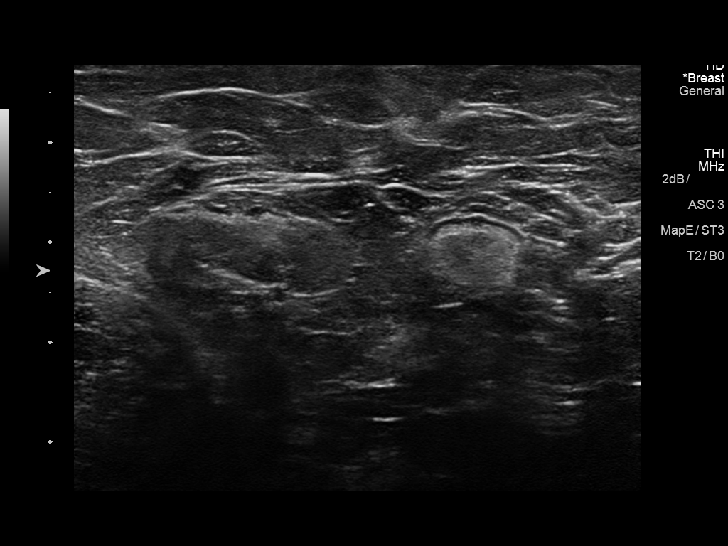

[13 of 13 positions shown; findings below may reference images not displayed]

ACR Breast Density Category b: There are scattered areas of
fibroglandular density.
FINDINGS: Spot compression CC and MLO views of bilateral breasts are
submitted. The previously questioned mass at the right breast 9
o'clock persists and appears spiculated with associated
calcification and biopsy clip. The previously noted mass in the
upper-outer quadrant left breast persists on additional views. The
mass in the left breast is unchanged compared to prior mammogram February 01, 2016.

Targeted ultrasound is performed, showing angulated hypoechoic
lesion at the right breast 9 o'clock 6 cm from nipple measuring
x 0.8 x 0.7 cm correlating to the mammographic mass. Ultrasound of
the right axilla is negative.

Ultrasound of the left breast demonstrate a minimal complicated cyst
at the left breast 12:30 o'clock 7 cm from nipple measuring 0.4 cm.
There is a normal intramammary lymph node at the left breast 2:30
o'clock 5 cm from nipple measuring 0.6 cm. One of these findings may
correlate to the mammographic finding. No other focal discrete
cystic or solid lesion is identified in the upper-outer quadrant
left breast. Ultrasound of the left axilla is negative.
IMPRESSION: Highly suspicious findings.

RECOMMENDATION:
Recommend ultrasound-guided core biopsy of right breast 9 o'clock
mass. Also recommend six-month follow-up left breast mammogram and
ultrasound. In 6 months, the patient would have 2 year stability is
established for the left breast finding.

I have discussed the findings and recommendations with the patient.
Results were also provided in writing at the conclusion of the
visit. If applicable, a reminder letter will be sent to the patient
regarding the next appointment.

BI-RADS CATEGORY  5: Highly suggestive of malignancy.

## 2019-10-20 ENCOUNTER — Encounter: Payer: Medicare Other | Admitting: Internal Medicine

## 2019-10-21 ENCOUNTER — Telehealth: Payer: Self-pay | Admitting: Internal Medicine

## 2019-10-21 DIAGNOSIS — E7800 Pure hypercholesterolemia, unspecified: Secondary | ICD-10-CM

## 2019-10-21 DIAGNOSIS — G2581 Restless legs syndrome: Secondary | ICD-10-CM

## 2019-10-21 DIAGNOSIS — I878 Other specified disorders of veins: Secondary | ICD-10-CM

## 2019-10-21 NOTE — Telephone Encounter (Signed)
Pt called and would like [facility] for 90days. Patient has a cpe scheduled on 12/28/2019

## 2019-10-21 NOTE — Telephone Encounter (Signed)
Patient called and would like to make an appt for her prolia injection. Is she do? Let me know so I can schedule.

## 2019-10-21 NOTE — Telephone Encounter (Signed)
Due 11/17/19 anytime after that date thanks.

## 2019-10-22 ENCOUNTER — Telehealth: Payer: Self-pay | Admitting: Internal Medicine

## 2019-10-22 MED ORDER — ATORVASTATIN CALCIUM 20 MG PO TABS: 20.0000 mg | ORAL_TABLET | Freq: Every day | ORAL | 3 refills | Status: DC

## 2019-10-22 MED ORDER — LEVOTHYROXINE SODIUM 50 MCG PO TABS: 50.0000 ug | ORAL_TABLET | Freq: Every day | ORAL | 0 refills | Status: DC

## 2019-10-22 MED ORDER — ROPINIROLE HCL 0.5 MG PO TABS: 0.5000 mg | ORAL_TABLET | Freq: Every day | ORAL | 3 refills | Status: DC

## 2019-10-22 MED ORDER — LETROZOLE 2.5 MG PO TABS: 2.5000 mg | ORAL_TABLET | Freq: Every day | ORAL | 3 refills | Status: DC

## 2019-10-22 MED ORDER — FUROSEMIDE 20 MG PO TABS: 20.0000 mg | ORAL_TABLET | Freq: Two times a day (BID) | ORAL | 3 refills | Status: DC

## 2019-10-22 NOTE — Telephone Encounter (Signed)
Pt steroids are reading at 221, last night reading was 578. Pt never has been over 120. Pt is asking for insulin. Please call her at [phone].

## 2019-10-22 NOTE — Telephone Encounter (Signed)
Medications have been refilled and sent to [facility].

## 2019-10-26 MED ORDER — LEVOTHYROXINE SODIUM 50 MCG PO TABS: 50.0000 ug | ORAL_TABLET | Freq: Every day | ORAL | 0 refills | Status: DC

## 2019-10-26 NOTE — Addendum Note (Signed)
Addended by: Adair Laundry on: 10/26/2019 02:38 PM   Modules accepted: Orders

## 2019-10-26 NOTE — Telephone Encounter (Signed)
Pt needs the levothyroxine (SYNTHROID) 50 MCG tablet sent to [facility] mail order  Her insurance has switched where her rx will go

## 2019-10-26 NOTE — Telephone Encounter (Signed)
Medication has been resent to [facility] and pharmacy has been changed in chart.

## 2019-10-29 ENCOUNTER — Other Ambulatory Visit: Payer: Self-pay

## 2019-10-29 DIAGNOSIS — G2581 Restless legs syndrome: Secondary | ICD-10-CM

## 2019-10-29 DIAGNOSIS — I878 Other specified disorders of veins: Secondary | ICD-10-CM

## 2019-10-29 DIAGNOSIS — E7800 Pure hypercholesterolemia, unspecified: Secondary | ICD-10-CM

## 2019-10-29 MED ORDER — LETROZOLE 2.5 MG PO TABS: 2.5000 mg | ORAL_TABLET | Freq: Every day | ORAL | 3 refills | Status: DC

## 2019-10-29 MED ORDER — ROPINIROLE HCL 0.5 MG PO TABS: 0.5000 mg | ORAL_TABLET | Freq: Every day | ORAL | 3 refills | Status: DC

## 2019-10-29 MED ORDER — FUROSEMIDE 20 MG PO TABS: 20.0000 mg | ORAL_TABLET | Freq: Two times a day (BID) | ORAL | 3 refills | Status: DC

## 2019-10-29 MED ORDER — ATORVASTATIN CALCIUM 20 MG PO TABS: 20.0000 mg | ORAL_TABLET | Freq: Every day | ORAL | 3 refills | Status: DC

## 2019-11-02 ENCOUNTER — Telehealth: Payer: Self-pay | Admitting: Internal Medicine

## 2019-11-02 NOTE — Telephone Encounter (Signed)
Pt called to discard previous message. States that pharmacy will take care of everything.

## 2019-11-02 NOTE — Telephone Encounter (Signed)
noted 

## 2019-11-02 NOTE — Telephone Encounter (Signed)
Patient has dropped [facility] and is going to [place] all five prescriptions should be transferred to [facility] pharmacy is currently holding all of patient's medicaitons, will not mail out. Per patient please remove [facility] from pharmacy list.

## 2019-11-09 ENCOUNTER — Telehealth: Payer: Self-pay | Admitting: Internal Medicine

## 2019-11-09 MED ORDER — LEVOTHYROXINE SODIUM 50 MCG PO TABS: 50.0000 ug | ORAL_TABLET | Freq: Every day | ORAL | 0 refills | Status: DC

## 2019-11-09 NOTE — Telephone Encounter (Signed)
Medication has been refilled and  Sent to [facility] pharmacy.

## 2019-11-09 NOTE — Telephone Encounter (Signed)
Pt needs the levothyroxine (SYNTHROID) 50 MCG tablet sent to [facility]. [facility] said they never received the rx and she only has 5day left

## 2019-11-18 ENCOUNTER — Other Ambulatory Visit: Payer: Self-pay

## 2019-11-18 ENCOUNTER — Ambulatory Visit (INDEPENDENT_AMBULATORY_CARE_PROVIDER_SITE_OTHER): Payer: Medicare Other

## 2019-11-18 DIAGNOSIS — M85852 Other specified disorders of bone density and structure, left thigh: Secondary | ICD-10-CM | POA: Diagnosis not present

## 2019-11-18 MED ORDER — DENOSUMAB 60 MG/ML ~~LOC~~ SOSY
60.0000 mg | PREFILLED_SYRINGE | Freq: Once | SUBCUTANEOUS | Status: AC
  Administered 2019-11-18: 60 mg via SUBCUTANEOUS

## 2019-11-18 NOTE — Progress Notes (Signed)
Pt presents today for a Prolia injection. Left arm, SQ. Pt tolerated injection well.

## 2019-11-25 ENCOUNTER — Telehealth: Payer: Self-pay | Admitting: Internal Medicine

## 2019-11-25 NOTE — Telephone Encounter (Signed)
Pt called to let Dr. Derrel Nip know that she got her 1st Covid Vaccine  She received [facility]

## 2019-11-26 NOTE — Telephone Encounter (Signed)
Spoke with pt and she stated that she received her vaccine on 11/24/2019. Chart has been updated.

## 2019-12-22 ENCOUNTER — Telehealth: Payer: Self-pay | Admitting: Internal Medicine

## 2019-12-22 DIAGNOSIS — R7303 Prediabetes: Secondary | ICD-10-CM

## 2019-12-22 DIAGNOSIS — E782 Mixed hyperlipidemia: Secondary | ICD-10-CM

## 2019-12-22 DIAGNOSIS — R5383 Other fatigue: Secondary | ICD-10-CM

## 2019-12-22 DIAGNOSIS — E039 Hypothyroidism, unspecified: Secondary | ICD-10-CM

## 2019-12-22 NOTE — Telephone Encounter (Signed)
Pt would like to have labs done prior to physical on 12/28/2018. I have ordered A1c, TSH, CBC, CMP and lipid panel. Is there anything else that needs to be ordered?

## 2019-12-22 NOTE — Telephone Encounter (Signed)
Great, Thanks.

## 2019-12-22 NOTE — Telephone Encounter (Signed)
Pt would like cpe labs drawn before her physical appt on 12/28/19  Please advise

## 2019-12-23 NOTE — Telephone Encounter (Signed)
Spoke with pt and she has been scheduled for a fasting lab appt on Friday. Pt is aware of appt date and time.

## 2019-12-24 ENCOUNTER — Other Ambulatory Visit: Payer: Self-pay

## 2019-12-24 ENCOUNTER — Ambulatory Visit (INDEPENDENT_AMBULATORY_CARE_PROVIDER_SITE_OTHER): Payer: Medicare Other

## 2019-12-24 VITALS — Ht 62.0 in | Wt 231.0 lb

## 2019-12-24 DIAGNOSIS — Z0000 Encounter for general adult medical examination without abnormal findings: Secondary | ICD-10-CM | POA: Diagnosis not present

## 2019-12-24 NOTE — Progress Notes (Addendum)
Subjective:   Carol [name] is a 74 y.o. female who presents for Medicare Annual (Subsequent) preventive examination.  Review of Systems:  No ROS.  Medicare Wellness Virtual Visit.  Visual/audio telehealth visit, UTA vital signs.   Ht/Wt provided. See social history for additional risk factors.   Cardiac Risk Factors include: advanced age (>67mn, >>80women);sedentary lifestyle     Objective:     Vitals: Ht '5\' 2"'$  (1.575 m)   Wt 231 lb (104.8 kg)   BMI 42.25 kg/m   Body mass index is 42.25 kg/m.  Advanced Directives 12/24/2019 09/03/2019 08/27/2018 08/20/2018 07/30/2018 03/20/2018 03/19/2018  Does Patient Have a Medical Advance Directive? Yes No Yes No Yes Yes Yes  Type of AParamedicof AOcheyedanLiving will - HYachatswill Living will Living will  Does patient want to make changes to medical advance directive? No - Patient declined - No - Patient declined No - Patient declined No - Patient declined - -  Copy of HOrleansin Chart? Yes - validated most recent copy scanned in chart (See row information) - - - - - -  Would patient like information on creating a medical advance directive? - No - Patient declined - - - - -    Tobacco Social History   Tobacco Use  Smoking Status Former Smoker  . Packs/day: 1.00  . Years: 40.00  . Pack years: 40.00  . Types: Cigarettes  . Quit date: 04/02/2003  . Years since quitting: 16.7  Smokeless Tobacco Never Used  Tobacco Comment   7-10 cigarettes for past 40 years      Counseling given: Not Answered Comment: 7-10 cigarettes for past 40 years    Clinical Intake:  Pre-visit preparation completed: Yes        Diabetes: No  How often do you need to have someone help you when you read instructions, pamphlets, or other written materials from your doctor or pharmacy?: 1 - Never  Interpreter Needed?: No     Past Medical History:  Diagnosis Date  .  Allergic rhinitis, cause unspecified   . Arthritis    hands  . Breast cancer ([place] 2019   8 mm ER/PR positive, HER-2/neu not overexpressed. Right breast- Oncotype score 14 (4% chance of recurrence on antiestrogen alone)  . Bronchitis    h/o  . Cataract   . Fibromyalgia   . HLD (hyperlipidemia)   . Hypothyroidism   . 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 Other abnormal glucose   . 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 Personal history of colonic polyps   . Personal history of radiation therapy 2019   f/u right breast cancer  . Pre-diabetes   . Presence of dental prosthetic device    implants - top and bottom  . Shingles   . Swelling of limb   . Thyroid goiter   . Tobacco use disorder   . Unspecified sinusitis (chronic)    Past Surgical History:  Procedure Laterality Date  . APPENDECTOMY  06/1997  . BREAST BIOPSY Right 11/05/2000    core with clip/neg  . BREAST BIOPSY Right 2011   benign, clip placed   . [facility]02/2017   [facility]  . CATARACT EXTRACTION W/PHACO Left 07/30/2018   Procedure: CATARACT EXTRACTION PHACO AND INTRAOCULAR LENS PLACEMENT (IB[place] LEFT;  Surgeon: BLeandrew Koyanagi MD;  [facility]  Service: Ophthalmology;  Laterality:  Left;  . CATARACT EXTRACTION W/PHACO Right 08/27/2018   Procedure: CATARACT EXTRACTION PHACO AND INTRAOCULAR LENS PLACEMENT ([place])  RIGHT;  Surgeon: Leandrew Koyanagi, MD;  Location: [facility];  Service: Ophthalmology;  Laterality: Right;  . COLONOSCOPY  10/15/2012   mutliple polyps.  Outlaw in [place].  Repeat 5 years.  . COLONOSCOPY W/ BIOPSIES  03/30/2004   Adenomatous polyp of the cecum, hyperplastic polyps of the splenic flexure, upper rectum and mid rectum.  . COLONOSCOPY WITH PROPOFOL N/A 03/19/2018   Tubular adenoma x2 without atypia.  Follow-up 2024.  Surgeon: Robert Bellow, MD;  Location[facility] ENDOSCOPY;  Service: Endoscopy;  Laterality:  N/A;  . COLPOSCOPY  10/16/2011   HGSIL pap smear.  Negative.  Marland Kitchen EXTERNAL EAR SURGERY  09/2008   external - not specified  (RIGHT)  . MASTECTOMY, PARTIAL Right 12/23/2017   T1b, N0; 8 mm ER: 100%; PR: 100%; Her 2 neu not overexpressed.  MammoSite placement.  Surgeon: Robert Bellow, MD;  Location: [facility] ORS;  Service: General;  Laterality: Right;  . SENTINEL NODE BIOPSY Right 12/23/2017   Procedure: SENTINEL NODE BIOPSY;  Surgeon: Robert Bellow, MD;  Location: [facility] ORS;  Service: General;  Laterality: Right;   Family History  Problem Relation Age of Onset  . Heart failure Father   . Hypertension Father   . Stroke Father   . Hyperlipidemia Father   . Heart failure Brother   . Diabetes Brother   . Heart disease Brother   . Diabetes Brother   . Heart disease Brother   . Hypertension Sister   . Depression Sister   . Hypertension Sister   . Asthma Sister   . Hyperlipidemia Sister   . Arthritis Other   . Coronary artery disease Other   . Hyperlipidemia Brother   . Hypertension Brother   . Breast cancer Maternal Aunt    Social History   Socioeconomic History  . Marital status: Married    Spouse name: Not on file  . Number of children: 2  . Years of education: Not on file  . Highest education level: Not on file  Occupational History  . Occupation: homemaker  Tobacco Use  . Smoking status: Former Smoker    Packs/day: 1.00    Years: 40.00    Pack years: 40.00    Types: Cigarettes    Quit date: 04/02/2003    Years since quitting: 16.7  . Smokeless tobacco: Never Used  . Tobacco comment: 7-10 cigarettes for past 40 years   Substance and Sexual Activity  . Alcohol use: Yes    Comment: rarely  . Drug use: No  . Sexual activity: Yes    Birth control/protection: Post-menopausal  Other Topics Concern  . Not on file  Social History Narrative   Marital status: married x 40 years; happily married; no abuse.      Children: 2 sons; one granddaughter (40), 1 adopted grandson  (1) in [place].      Lives: with husband.      Employment: unemployed/homemaker.      Tobacco: quit smoking 03/2012.  Electronic cigarette.      Alcohol: socially; 2 glasses per year.      Exercise:  None/sporadic      Seatbelt:  Always uses seat belts.   Smoke alarm and carbon monoxide detector in the home.       Guns:  No guns.      Caffeine QQP:YPPJKD 3 servings per day.  ADLs: independent with ADLs in 2018; drives; no assistant devices.       Advanced Directives: yes; DNR/DNI   Social Determinants of Health   Financial Resource Strain:   . Difficulty of Paying Living Expenses:   Food Insecurity:   . Worried About Charity fundraiser in the Last Year:   . Arboriculturist in the Last Year:   Transportation Needs:   . Film/video editor (Medical):   Marland Kitchen Lack of Transportation (Non-Medical):   Physical Activity:   . Days of Exercise per Week:   . Minutes of Exercise per Session:   Stress:   . Feeling of Stress :   Social Connections:   . Frequency of Communication with Friends and Family:   . Frequency of Social Gatherings with Friends and Family:   . Attends Religious Services:   . Active Member of Clubs or Organizations:   . Attends Archivist Meetings:   Marland Kitchen Marital Status:     Outpatient Encounter Medications as of 12/24/2019  Medication Sig  . Ascorbic Acid (VITAMIN C) 1000 MG tablet Take 1,000 mg by mouth daily.  Marland Kitchen aspirin 81 MG tablet Take 81 mg by mouth daily.   Marland Kitchen atorvastatin (LIPITOR) 20 MG tablet Take 1 tablet (20 mg total) by mouth daily.  . cetirizine (ZYRTEC) 10 MG tablet Take 10 mg by mouth every morning.   . Cholecalciferol (VITAMIN D3) 2000 UNITS capsule Take 2,000 Units by mouth daily.    . Coenzyme Q10 (COQ-10) 200 MG CAPS Take 200 mg by mouth daily.  . fluticasone (FLONASE) 50 MCG/ACT nasal spray Place 2 sprays into both nostrils daily. (Patient taking differently: Place 2 sprays into both nostrils daily as needed for allergies. )  .  furosemide (LASIX) 20 MG tablet Take 1 tablet (20 mg total) by mouth 2 (two) times daily.  . Glucosamine Sulfate 1000 MG CAPS Take 1,000 mg by mouth daily.   Marland Kitchen letrozole (FEMARA) 2.5 MG tablet Take 1 tablet (2.5 mg total) by mouth daily.  Marland Kitchen levothyroxine (SYNTHROID) 50 MCG tablet Take 1 tablet (50 mcg total) by mouth daily.  . Misc Natural Products (OSTEO BI-FLEX JOINT SHIELD) TABS Take 2 tablets by mouth daily. With magnesium   . Multiple Vitamin (MULTIVITAMIN) tablet Take 1 tablet by mouth daily.  . Olopatadine HCl (PATADAY OP) Place 1 drop into both eyes daily as needed (allergies).  . Polyvinyl Alcohol-Povidone (REFRESH OP) Place 1 drop into both eyes daily as needed (dry eyes).  Marland Kitchen rOPINIRole (REQUIP) 0.5 MG tablet Take 1 tablet (0.5 mg total) by mouth at bedtime.  . senna (SENOKOT) 8.6 MG tablet Take 1-6 tablets by mouth at bedtime.    No facility-administered encounter medications on file as of 12/24/2019.    Activities of Daily Living In your present state of health, do you have any difficulty performing the following activities: 12/24/2019  Hearing? N  Vision? N  Difficulty concentrating or making decisions? N  Walking or climbing stairs? N  Dressing or bathing? N  Doing errands, shopping? N  Preparing Food and eating ? Y  Comment She does not cook. Self feeds.  Using the Toilet? N  In the past six months, have you accidently leaked urine? N  Do you have problems with loss of bowel control? N  Managing your Medications? N  Managing your Finances? N  Housekeeping or managing your Housekeeping? Y  Comment Husband manages  Some recent data might be hidden    Patient  Care Team: Crecencio Mc, MD as PCP - General (Internal Medicine) Minna Merritts, MD as Consulting Physician (Cardiology) Bary Castilla Forest Gleason, MD (General Surgery) Crecencio Mc, MD (Internal Medicine)    Assessment:   This is a routine wellness examination for Carol [name].  Nurse connected with patient  12/24/19 at 11:00 AM EST by a telephone enabled telemedicine application and verified that I am speaking with the correct person using two identifiers. Patient stated full name and DOB. Patient gave permission to continue with virtual visit. Patient's location was at home and Nurse's location was at [place] office.   Patient is alert and oriented x3. Patient denies difficulty focusing or concentrating. Patient likes to read and complete crossword puzzles for brain stimulation.   Health Maintenance Due: See completed HM at the end of note.   Eye: Visual acuity not assessed. Virtual visit. Followed by their ophthalmologist. Cataracts extracted.   Dental: Visits every 4 months.    Hearing: Demonstrates normal hearing during visit. Hearing difficulties -borderline per patient. No hearing aid.   Safety:  Patient feels safe at home- yes Patient does have smoke detectors at home- yes Patient does wear sunscreen or protective clothing when in direct sunlight - yes Patient does wear seat belt when in a moving vehicle - yes Patient drives- yes Adequate lighting in walkways free from debris- yes Grab bars and handrails used as appropriate- yes Ambulates with an assistive device- no  Social: Alcohol intake - yes      Smoking history- former  Smokers in home? none Illicit drug use? none  Medication: Taking as directed and without issues.  Pill box in use -yes  Self managed - yes   Covid-19: Precautions and sickness symptoms discussed. Wears mask, social distancing, hand hygiene as appropriate.   Activities of Daily Living Patient denies needing assistance with: feeding themselves, getting from bed to chair, getting to the toilet, bathing/showering, dressing or managing money.  Assisted by husband with household chores and preparing meals.    Discussed the importance of a healthy diet, water intake and the benefits of aerobic exercise.   Physical activity- none. Encouraged to stay  active as tolerated.   Diet:  Regular Water: good intake Caffeine: 1-2 cups of caffeine  Other Providers Patient Care Team: Crecencio Mc, MD as PCP - General (Internal Medicine) Minna Merritts, MD as Consulting Physician (Cardiology) Bary Castilla, Forest Gleason, MD (General Surgery) Crecencio Mc, MD (Internal Medicine)   Exercise Activities and Dietary recommendations Current Exercise Habits: The patient does not participate in regular exercise at present  Goals    . DIET - REDUCE PORTION SIZE       Fall Risk Fall Risk  12/24/2019 12/23/2018 01/03/2018 03/06/2017 03/06/2017  Falls in the past year? 0 0 No No No  Follow up Falls evaluation completed;Falls prevention discussed - - - -    Timed Get Up and Go performed: no, virtual visit  Depression Screen PHQ 2/9 Scores 12/24/2019 01/03/2018 03/06/2017 03/06/2017  PHQ - 2 Score 0 0 0 0  PHQ- 9 Score - - - -     Cognitive Function MMSE - Mini Mental State Exam 12/24/2019  Not completed: Unable to complete        Immunization History  Administered Date(s) Administered  . Influenza Split 08/21/2010, 08/15/2014  . Influenza, High Dose Seasonal PF 11/20/2017, 09/05/2018, 07/30/2019  . Influenza, Seasonal, Injecte, Preservative Fre 10/06/2012  . Influenza,inj,Quad PF,6+ Mos 07/03/2016  . Moderna SARS-COVID-2 Vaccination 11/24/2019, 12/22/2019  .  Pneumococcal Conjugate-13 09/20/2014  . Pneumococcal Polysaccharide-23 10/06/2012  . Tdap 09/12/2011  . Zoster 10/28/2012  . Zoster Recombinat (Shingrix) 08/13/2018, 10/13/2018   Screening Tests Health Maintenance  Topic Date Due  . MAMMOGRAM  07/22/2021  . TETANUS/TDAP  09/11/2021  . COLONOSCOPY  03/19/2028  . INFLUENZA VACCINE  Completed  . DEXA SCAN  Completed  . Hepatitis C Screening  Completed  . PNA vac Low Risk Adult  Completed      Plan:   Keep all routine maintenance appointments.   Next scheduled fasting lab 12/25/19   Cpe 12/28/19   Medicare Attestation I  have personally reviewed: The patient's medical and social history Their use of alcohol, tobacco or illicit drugs Their current medications and supplements The patient's functional ability including ADLs,fall risks, home safety risks, cognitive, and hearing and visual impairment Diet and physical activities Evidence for depression   I have reviewed and discussed with patient certain preventive protocols, quality metrics, and best practice recommendations.      OBrien-Blaney, Dilara Navarrete L, LPN  11/08/5807     I have reviewed the above information and agree with above.   Deborra Medina, MD

## 2019-12-24 NOTE — Patient Instructions (Addendum)
  Ms. Carol [name] , Thank you for taking time to come for your Medicare Wellness Visit. I appreciate your ongoing commitment to your health goals. Please review the following plan we discussed and let me know if I can assist you in the future.   These are the goals we discussed: Goals    . DIET - REDUCE PORTION SIZE       This is a list of the screening recommended for you and due dates:  Health Maintenance  Topic Date Due  . Mammogram  07/22/2021  . Tetanus Vaccine  09/11/2021  . Colon Cancer Screening  03/19/2028  . Flu Shot  Completed  . DEXA scan (bone density measurement)  Completed  .  Hepatitis C: One time screening is recommended by Center for Disease Control  (CDC) for  adults born from [birth year] through 1964.   Completed  . Pneumonia vaccines  Completed

## 2019-12-25 ENCOUNTER — Ambulatory Visit: Payer: Medicare Other

## 2019-12-25 ENCOUNTER — Other Ambulatory Visit (INDEPENDENT_AMBULATORY_CARE_PROVIDER_SITE_OTHER): Payer: Medicare Other

## 2019-12-25 ENCOUNTER — Other Ambulatory Visit: Payer: Self-pay

## 2019-12-25 DIAGNOSIS — E782 Mixed hyperlipidemia: Secondary | ICD-10-CM

## 2019-12-25 DIAGNOSIS — R7303 Prediabetes: Secondary | ICD-10-CM | POA: Diagnosis not present

## 2019-12-25 DIAGNOSIS — E039 Hypothyroidism, unspecified: Secondary | ICD-10-CM | POA: Diagnosis not present

## 2019-12-25 DIAGNOSIS — R5383 Other fatigue: Secondary | ICD-10-CM | POA: Diagnosis not present

## 2019-12-25 LAB — COMPREHENSIVE METABOLIC PANEL
ALT: 25 U/L (ref 0–35)
AST: 22 U/L (ref 0–37)
Albumin: 3.9 g/dL (ref 3.5–5.2)
Alkaline Phosphatase: 59 U/L (ref 39–117)
BUN: 8 mg/dL (ref 6–23)
CO2: 33 mEq/L — ABNORMAL HIGH (ref 19–32)
Calcium: 9 mg/dL (ref 8.4–10.5)
Chloride: 102 mEq/L (ref 96–112)
Creatinine, Ser: 0.88 mg/dL (ref 0.40–1.20)
GFR: 63.03 mL/min (ref >60.00)
Glucose, Bld: 148 mg/dL — ABNORMAL HIGH (ref 70–99)
Potassium: 3.9 mEq/L (ref 3.5–5.1)
Sodium: 142 mEq/L (ref 135–145)
Total Bilirubin: 0.8 mg/dL (ref 0.2–1.2)
Total Protein: 6.8 g/dL (ref 6.0–8.3)

## 2019-12-25 LAB — TSH: TSH: 2.08 u[IU]/mL (ref 0.35–4.50)

## 2019-12-25 LAB — CBC WITH DIFFERENTIAL/PLATELET
Basophils Absolute: 0 10*3/uL (ref 0.0–0.1)
Basophils Relative: 0.8 % (ref 0.0–3.0)
Eosinophils Absolute: 0.3 10*3/uL (ref 0.0–0.7)
Eosinophils Relative: 6.2 % — ABNORMAL HIGH (ref 0.0–5.0)
HCT: 41 % (ref 36.0–46.0)
Hemoglobin: 14.2 g/dL (ref 12.0–15.0)
Lymphocytes Relative: 27.5 % (ref 12.0–46.0)
Lymphs Abs: 1.5 10*3/uL (ref 0.7–4.0)
MCHC: 34.7 g/dL (ref 30.0–36.0)
MCV: 89.1 fl (ref 78.0–100.0)
Monocytes Absolute: 0.7 10*3/uL (ref 0.1–1.0)
Monocytes Relative: 12.3 % — ABNORMAL HIGH (ref 3.0–12.0)
Neutro Abs: 3 10*3/uL (ref 1.4–7.7)
Neutrophils Relative %: 53.2 % (ref 43.0–77.0)
Platelets: 182 10*3/uL (ref 150.0–400.0)
RBC: 4.6 Mil/uL (ref 3.87–5.11)
RDW: 14 % (ref 11.5–15.5)
WBC: 5.6 10*3/uL (ref 4.0–10.5)

## 2019-12-25 LAB — LIPID PANEL
Cholesterol: 127 mg/dL (ref 0–200)
HDL: 46.3 mg/dL (ref >39.00)
LDL Cholesterol: 56 mg/dL (ref 0–99)
NonHDL: 80.38
Total CHOL/HDL Ratio: 3
Triglycerides: 123 mg/dL (ref 0.0–149.0)
VLDL: 24.6 mg/dL (ref 0.0–40.0)

## 2019-12-25 LAB — HEMOGLOBIN A1C: Hgb A1c MFr Bld: 6.6 % — ABNORMAL HIGH (ref 4.6–6.5)

## 2019-12-28 ENCOUNTER — Encounter: Payer: Self-pay | Admitting: Internal Medicine

## 2019-12-28 ENCOUNTER — Other Ambulatory Visit: Payer: Self-pay

## 2019-12-28 ENCOUNTER — Ambulatory Visit (INDEPENDENT_AMBULATORY_CARE_PROVIDER_SITE_OTHER): Payer: Medicare Other | Admitting: Internal Medicine

## 2019-12-28 VITALS — BP 136/78 | HR 84 | Temp 96.4°F | Resp 15 | Ht 62.0 in | Wt 231.0 lb

## 2019-12-28 DIAGNOSIS — L600 Ingrowing nail: Secondary | ICD-10-CM | POA: Diagnosis not present

## 2019-12-28 DIAGNOSIS — C50411 Malignant neoplasm of upper-outer quadrant of right female breast: Secondary | ICD-10-CM | POA: Diagnosis not present

## 2019-12-28 DIAGNOSIS — E119 Type 2 diabetes mellitus without complications: Secondary | ICD-10-CM | POA: Diagnosis not present

## 2019-12-28 DIAGNOSIS — Z6841 Body Mass Index (BMI) 40.0 and over, adult: Secondary | ICD-10-CM

## 2019-12-28 DIAGNOSIS — R928 Other abnormal and inconclusive findings on diagnostic imaging of breast: Secondary | ICD-10-CM

## 2019-12-28 DIAGNOSIS — Z170 Estrogen receptor positive status [ER+]: Secondary | ICD-10-CM

## 2019-12-28 DIAGNOSIS — E782 Mixed hyperlipidemia: Secondary | ICD-10-CM | POA: Diagnosis not present

## 2019-12-28 DIAGNOSIS — Z0001 Encounter for general adult medical examination with abnormal findings: Secondary | ICD-10-CM

## 2019-12-28 DIAGNOSIS — E039 Hypothyroidism, unspecified: Secondary | ICD-10-CM

## 2019-12-28 NOTE — Patient Instructions (Signed)
Podiatry referral is in process  You have progressed to type 2 Diabetes.  Please return in 3 months for follow up   Diabetes Mellitus and Standards of Medical Care Managing diabetes (diabetes mellitus) can be complicated. Your diabetes treatment may be managed by a team of health care providers, including:  A physician who specializes in diabetes (endocrinologist).  A nurse practitioner or physician assistant.  Nurses.  A diet and nutrition specialist (registered dietitian).  A certified diabetes educator (CDE).  An exercise specialist.  A pharmacist.  An eye doctor.  A foot specialist (podiatrist).  A dentist.  A primary care provider.  A mental health provider. Your health care providers follow guidelines to help you get the best quality of care. The following schedule is a general guideline for your diabetes management plan. Your health care providers may give you more specific instructions. Physical exams Upon being diagnosed with diabetes mellitus, and each year after that, your health care provider will ask about your medical and family history. He or she will also do a physical exam. Your exam may include:  Measuring your height, weight, and body mass index (BMI).  Checking your blood pressure. This will be done at every routine medical visit. Your target blood pressure may vary depending on your medical conditions, your age, and other factors.  Thyroid gland exam.  Skin exam.  Screening for damage to your nerves (peripheral neuropathy). This may include checking the pulse in your legs and feet and checking the level of sensation in your hands and feet.  A complete foot exam to inspect the structure and skin of your feet, including checking for cuts, bruises, redness, blisters, sores, or other problems.  Screening for blood vessel (vascular) problems, which may include checking the pulse in your legs and feet and checking your temperature. Blood tests Depending  on your treatment plan and your personal needs, you may have the following tests done:  HbA1c (hemoglobin A1c). This test provides information about blood sugar (glucose) control over the previous 2-3 months. It is used to adjust your treatment plan, if needed. This test will be done: ? At least 2 times a year, if you are meeting your treatment goals. ? 4 times a year, if you are not meeting your treatment goals or if treatment goals have changed.  Lipid testing, including total, LDL, and HDL cholesterol and triglyceride levels. ? The goal for LDL is less than 100 mg/dL (5.5 mmol/L). If you are at high risk for complications, the goal is less than 70 mg/dL (3.9 mmol/L). ? The goal for HDL is 40 mg/dL (2.2 mmol/L) or higher for men and 50 mg/dL (2.8 mmol/L) or higher for women. An HDL cholesterol of 60 mg/dL (3.3 mmol/L) or higher gives some protection against heart disease. ? The goal for triglycerides is less than 150 mg/dL (8.3 mmol/L).  Liver function tests.  Kidney function tests.  Thyroid function tests. Dental and eye exams  Visit your dentist two times a year.  If you have type 1 diabetes, your health care provider may recommend an eye exam 3-5 years after you are diagnosed, and then once a year after your first exam. ? For children with type 1 diabetes, a health care provider may recommend an eye exam when your child is age 85 or older and has had diabetes for 3-5 years. After the first exam, your child should get an eye exam once a year.  If you have type 2 diabetes, your health care provider  may recommend an eye exam as soon as you are diagnosed, and then once a year after your first exam. Immunizations   The yearly flu (influenza) vaccine is recommended for everyone 6 months or older who has diabetes.  The pneumonia (pneumococcal) vaccine is recommended for everyone 2 years or older who has diabetes. If you are 67 or older, you may get the pneumonia vaccine as a series of two  separate shots.  The hepatitis B vaccine is recommended for adults shortly after being diagnosed with diabetes.  Adults and children with diabetes should receive all other vaccines according to age-specific recommendations from the Centers for Disease Control and Prevention (CDC). Mental and emotional health Screening for symptoms of eating disorders, anxiety, and depression is recommended at the time of diagnosis and afterward as needed. If your screening shows that you have symptoms (positive screening result), you may need more evaluation and you may work with a mental health care provider. Treatment plan Your treatment plan will be reviewed at every medical visit. You and your health care provider will discuss:  How you are taking your medicines, including insulin.  Any side effects you are experiencing.  Your blood glucose target goals.  The frequency of your blood glucose monitoring.  Lifestyle habits, such as activity level as well as tobacco, alcohol, and substance use. Diabetes self-management education Your health care provider will assess how well you are monitoring your blood glucose levels and whether you are taking your insulin correctly. He or she may refer you to:  A certified diabetes educator to manage your diabetes throughout your life, starting at diagnosis.  A registered dietitian who can create or review your personal nutrition plan.  An exercise specialist who can discuss your activity level and exercise plan. Summary  Managing diabetes (diabetes mellitus) can be complicated. Your diabetes treatment may be managed by a team of health care providers.  Your health care providers follow guidelines in order to help you get the best quality of care.  Standards of care including having regular physical exams, blood tests, blood pressure monitoring, immunizations, screening tests, and education about how to manage your diabetes.  Your health care providers may also  give you more specific instructions based on your individual health. This information is not intended to replace advice given to you by your health care provider. Make sure you discuss any questions you have with your health care provider. Document Revised: 06/20/2018 Document Reviewed: 06/29/2016 Elsevier Patient Education  [place].

## 2019-12-28 NOTE — Assessment & Plan Note (Signed)
MANAGED currently with [facility]. Last mammogram in October 2020 was suspicious but both Byrnett and [facility] agree that the area of concern is scar tissue

## 2019-12-28 NOTE — Progress Notes (Signed)
Patient ID: [name], female    DOB: 08/11/1946  Age: 74 y.o. MRN: [id number]  The patient is here for annual PREVENTIVE examination and management of other chronic and acute problems.   The risk factors are reflected in the social history.  The roster of all physicians providing medical care to patient - is listed in the Snapshot section of the chart.  Activities of daily living:  The patient is 100% independent in all ADLs: dressing, toileting, feeding as well as independent mobility  Home safety : The patient has smoke detectors in the home. They wear seatbelts.  There are no firearms at home. There is no violence in the home.   There is no risks for hepatitis, STDs or HIV. There is no   history of blood transfusion. They have no travel history to infectious disease endemic areas of the world.  The patient has seen their dentist in the last six month. They have seen their eye doctor in the last year. They admit to slight hearing difficulty with regard to whispered voices and some television programs.  They have deferred audiologic testing in the last year.  They do not  have excessive sun exposure. Discussed the need for sun protection: hats, long sleeves and use of sunscreen if there is significant sun exposure.   Diet: the importance of a healthy diet is discussed. They do have a healthy diet.  The benefits of regular aerobic exercise were discussed. She walks 4 times per week ,  20 minutes.   Depression screen: there are no signs or vegative symptoms of depression- irritability, change in appetite, anhedonia, sadness/tearfullness.  Cognitive assessment: the patient manages all their financial and personal affairs and is actively engaged. They could relate day,date,year and events; recalled 2/3 objects at 3 minutes; performed clock-face test normally.  The following portions of the patient's history were reviewed and updated as appropriate: allergies, current medications,  past family history, past medical history,  past surgical history, past social history  and problem list.  Visual acuity was not assessed per patient preference since she has regular follow up with her ophthalmologist. Hearing and body mass index were assessed and reviewed.   During the course of the visit the patient was educated and counseled about appropriate screening and preventive services including : fall prevention , diabetes screening, nutrition counseling, colorectal cancer screening, and recommended immunizations.    CC: The primary encounter diagnosis was Encounter for general adult medical examination with abnormal findings. Diagnoses of Malignant neoplasm of upper-outer quadrant of right breast in female, estrogen receptor positive ([place]), Type 2 diabetes mellitus without complication, without long-term current use of insulin ([facility]), Ingrown toenail, Abnormal mammogram of right breast, Class 3 severe obesity with serious comorbidity and body mass index (BMI) of 40.0 to 44.9 in adult, unspecified obesity type ([place]), Mixed hyperlipidemia, and Hypothyroidism (acquired) were also pertinent to this visit.  1) progression from pre diabetes to type 2 Diabetes discussed in detail today. 2) abnormal mammogram discussed:  She has had 2 opinions on need for biopsy (Rad Onc and Surgery) and both feel that the abnormality is scar tissue 3)  History of breast Ca:  Taking femara.  4) Patient has received both doses of the available COVID 19 vaccine without complications.  Patient continues to mask when outside of the home except when walking in yard or at safe distances from others .  Patient denies any change in mood or development of unhealthy behaviors resuting from the pandemic's restriction  of activities and socialization.    History Lakrista has a past medical history of Allergic rhinitis, cause unspecified, Arthritis, Breast cancer ([place]) (2019), Bronchitis, Cataract, Fibromyalgia, HLD  (hyperlipidemia), Hypothyroidism, Menieres disease (2011), Other abnormal glucose, Papanicolaou smear of cervix with high grade squamous intraepithelial lesion (HGSIL), Personal history of colonic polyps, Personal history of radiation therapy (2019), Pre-diabetes, Presence of dental prosthetic device, Shingles, Swelling of limb, Thyroid goiter, Tobacco use disorder, and Unspecified sinusitis (chronic).   She has a past surgical history that includes Appendectomy (06/1997); External ear surgery (09/2008); Colposcopy (10/16/2011); Colonoscopy (10/15/2012); Colonoscopy w/ biopsies (03/30/2004); Mastectomy, partial (Right, 12/23/2017); Sentinel node biopsy (Right, 12/23/2017); Colonoscopy with propofol (N/A, 03/19/2018); Cataract extraction w/PHACO (Left, 07/30/2018); Cataract extraction w/PHACO (Right, 08/27/2018); Breast biopsy (Right, 11/05/2000); Breast biopsy (Right, 2011); and Breast biopsy (Right, 12/02/2017).   Her family history includes Arthritis in an other family member; Asthma in her sister; Breast cancer in her maternal aunt; Coronary artery disease in an other family member; Depression in her sister; Diabetes in her brother and brother; Heart disease in her brother and brother; Heart failure in her brother and father; Hyperlipidemia in her brother, father, and sister; Hypertension in her brother, father, sister, and sister; Stroke in her father.She reports that she quit smoking about 16 years ago. Her smoking use included cigarettes. She has a 40.00 pack-year smoking history. She has never used smokeless tobacco. She reports current alcohol use. She reports that she does not use drugs.  Outpatient Medications Prior to Visit  Medication Sig Dispense Refill  . Ascorbic Acid (VITAMIN C) 1000 MG tablet Take 1,000 mg by mouth daily.    Marland Kitchen aspirin 81 MG tablet Take 81 mg by mouth daily.     Marland Kitchen atorvastatin (LIPITOR) 20 MG tablet Take 1 tablet (20 mg total) by mouth daily. 90 tablet 3  . cetirizine (ZYRTEC) 10  MG tablet Take 10 mg by mouth every morning.     . Cholecalciferol (VITAMIN D3) 2000 UNITS capsule Take 2,000 Units by mouth daily.      . Coenzyme Q10 (COQ-10) 200 MG CAPS Take 200 mg by mouth daily.    . fluticasone (FLONASE) 50 MCG/ACT nasal spray Place 2 sprays into both nostrils daily. (Patient taking differently: Place 2 sprays into both nostrils daily as needed for allergies. ) 16 g 6  . furosemide (LASIX) 20 MG tablet Take 1 tablet (20 mg total) by mouth 2 (two) times daily. 180 tablet 3  . Glucosamine Sulfate 1000 MG CAPS Take 1,000 mg by mouth daily.     Marland Kitchen letrozole (FEMARA) 2.5 MG tablet Take 1 tablet (2.5 mg total) by mouth daily. 90 tablet 3  . levothyroxine (SYNTHROID) 50 MCG tablet Take 1 tablet (50 mcg total) by mouth daily. 90 tablet 0  . Misc Natural Products (OSTEO BI-FLEX JOINT SHIELD) TABS Take 2 tablets by mouth daily. With magnesium     . Multiple Vitamin (MULTIVITAMIN) tablet Take 1 tablet by mouth daily.    . Olopatadine HCl (PATADAY OP) Place 1 drop into both eyes daily as needed (allergies).    . Polyvinyl Alcohol-Povidone (REFRESH OP) Place 1 drop into both eyes daily as needed (dry eyes).    Marland Kitchen rOPINIRole (REQUIP) 0.5 MG tablet Take 1 tablet (0.5 mg total) by mouth at bedtime. 90 tablet 3  . senna (SENOKOT) 8.6 MG tablet Take 1-6 tablets by mouth at bedtime.      No facility-administered medications prior to visit.    Review of Systems  Patient denies headache, fevers, malaise, unintentional weight loss, skin rash, eye pain, sinus congestion and sinus pain, sore throat, dysphagia,  hemoptysis , cough, dyspnea, wheezing, chest pain, palpitations, orthopnea, edema, abdominal pain, nausea, melena, diarrhea, constipation, flank pain, dysuria, hematuria, urinary  Frequency, nocturia, numbness, tingling, seizures,  Focal weakness, Loss of consciousness,  Tremor, insomnia, depression, anxiety, and suicidal ideation.      Objective:  BP 136/78 (BP Location: Left Arm,  Patient Position: Sitting, Cuff Size: Normal)   Pulse 84   Temp (!) 96.4 F (35.8 C) (Temporal)   Resp 15   Ht 5\' 2"  (1.575 m)   Wt 231 lb (104.8 kg)   SpO2 96%   BMI 42.25 kg/m   Physical Exam  General appearance: alert, cooperative and appears stated age Head: Normocephalic, without obvious abnormality, atraumatic Eyes: conjunctivae/corneas clear. PERRL, EOM's intact. Fundi benign. Ears: normal TM's and external ear canals both ears Nose: Nares normal. Septum midline. Mucosa normal. No drainage or sinus tenderness. Throat: lips, mucosa, and tongue normal; teeth and gums normal Neck: no adenopathy, no carotid bruit, no JVD, supple, symmetrical, trachea midline and thyroid not enlarged, symmetric, no tenderness/mass/nodules Lungs: clear to auscultation bilaterally Breasts: palpable mass /focal thickening right breast at prior surgical site.  Left breast normal without masses or tenderness Heart: regular rate and rhythm, S1, S2 normal, no murmur, click, rub or gallop Abdomen: soft, non-tender; bowel sounds normal; no masses,  no organomegaly Extremities: extremities normal, atraumatic, no cyanosis or edema Pulses: 2+ and symmetric Skin: Skin color, texture, turgor normal. No rashes or lesions Neurologic: Alert and oriented X 3, normal strength and tone. Normal symmetric reflexes. Normal coordination and gait.    Assessment & Plan:   Problem List Items Addressed This Visit      Unprioritized   Hyperlipidemia    She has 3 vessel disease by 2017 coronary calcium scoring and  is tolerating atorvastatin without side effects. LD is at goal,   Lab Results  Component Value Date   CHOL 127 12/25/2019   HDL 46.30 12/25/2019   LDLCALC 56 12/25/2019   TRIG 123.0 12/25/2019   CHOLHDL 3 12/25/2019   Lab Results  Component Value Date   ALT 25 12/25/2019   AST 22 12/25/2019   ALKPHOS 59 12/25/2019   BILITOT 0.8 12/25/2019         Encounter for general adult medical examination  with abnormal findings - Primary    age appropriate education and counseling updated, referrals for preventative services and immunizations addressed, dietary and smoking counseling addressed, most recent labs reviewed.  I have personally reviewed and have noted:  1) the patient's medical and social history 2) The pt's use of alcohol, tobacco, and illicit drugs 3) The patient's current medications and supplements 4) Functional ability including ADL's, fall risk, home safety risk, hearing and visual impairment 5) Diet and physical activities 6) Evidence for depression or mood disorder 7) The patient's height, weight, and BMI have been recorded in the chart  I have made referrals, and provided counseling and education based on review of the above      Obesity    Now with type 2 diabetes. I have addressed  BMI and recommended a low glycemic index diet utilizing smaller more frequent meals to increase metabolism.  I have also recommended that patient start exercising with a goal of 30 minutes of aerobic exercise a minimum of 5 days per week.       Hypothyroidism (acquired)    Thyroid  function is WNL on current dose.  No current changes needed.   Lab Results  Component Value Date   TSH 2.08 12/25/2019         Malignant neoplasm of upper-outer quadrant of right female breast ([place])    MANAGED currently with Femara. Last mammogram in October 2020 was suspicious but both Byrnett and Chrystal agree that the area of concern is scar tissue      Type 2 diabetes mellitus without complication, without long-term current use of insulin ([facility])    New diagnosis,  A total of 30 minutes of face to face time was spent with patient  counselling about this new diagnosis  and coordination of care       Relevant Orders   Ambulatory referral to Podiatry   Microalbumin / creatinine urine ratio   Abnormal mammogram of right breast    Patient was advised by Dr Doreatha Lew to return in January 2021 for  ultrasound and possible core biopsy,  Which she has not done.  She is under the impression that earlier follow up is needed .  I have reviewed his notes from October and will notify her and advise the same/        Other Visit Diagnoses    Ingrown toenail       Relevant Orders   Ambulatory referral to Podiatry      I am having Gladis Riffle maintain her Glucosamine Sulfate, Osteo Bi-Flex Joint Shield, Vitamin D3, multivitamin, fluticasone, aspirin, cetirizine, vitamin C, CoQ-10, senna, Olopatadine HCl (PATADAY OP), Polyvinyl Alcohol-Povidone (REFRESH OP), atorvastatin, letrozole, rOPINIRole, furosemide, and levothyroxine.  No orders of the defined types were placed in this encounter.   There are no discontinued medications.  Follow-up: No follow-ups on file.   Crecencio Mc, MD

## 2019-12-29 ENCOUNTER — Telehealth: Payer: Self-pay | Admitting: Internal Medicine

## 2019-12-29 DIAGNOSIS — R928 Other abnormal and inconclusive findings on diagnostic imaging of breast: Secondary | ICD-10-CM | POA: Insufficient documentation

## 2019-12-29 NOTE — Assessment & Plan Note (Signed)
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 LD is at goal,   Lab Results  Component Value Date   CHOL 127 12/25/2019   HDL 46.30 12/25/2019   LDLCALC 56 12/25/2019   TRIG 123.0 12/25/2019   CHOLHDL 3 12/25/2019   Lab Results  Component Value Date   ALT 25 12/25/2019   AST 22 12/25/2019   ALKPHOS 59 12/25/2019   BILITOT 0.8 12/25/2019

## 2019-12-29 NOTE — Assessment & Plan Note (Signed)
Thyroid function is WNL on current dose.  No current changes needed.   Lab Results  Component Value Date   TSH 2.08 12/25/2019

## 2019-12-29 NOTE — Assessment & Plan Note (Signed)
Now with type 2 diabetes. I have addressed  BMI and recommended a low glycemic index diet utilizing smaller more frequent meals to increase metabolism.  I have also recommended that patient start exercising with a goal of 30 minutes of aerobic exercise a minimum of 5 days per week.

## 2019-12-29 NOTE — Telephone Encounter (Signed)
She was supposed to follow up with Dr Bary Castilla in January (3 moths after the evaluation for the abnormal mammogram)  For a repeat ultrasound  I do not see that she has done this.  Please confirm with patient

## 2019-12-29 NOTE — Assessment & Plan Note (Addendum)
Patient was advised by Dr Doreatha Lew to return in January 2021 for ultrasound and possible core biopsy,  Which she has not done.  She is under the impression that earlier follow up is needed .  I have reviewed his notes from October and will notify her and advise the same/

## 2019-12-29 NOTE — Assessment & Plan Note (Addendum)
New diagnosis,  A total of 30 minutes of face to face time was spent with patient  counselling about this new diagnosis  and coordination of care

## 2019-12-29 NOTE — Assessment & Plan Note (Signed)

## 2019-12-31 NOTE — Telephone Encounter (Signed)
Pt stated that it got postponed until next month due to her seeing Dr. Donella Stade in January but she will be seeing Dr. Bary Castilla next month.

## 2020-01-27 ENCOUNTER — Ambulatory Visit: Payer: Medicare Other | Admitting: Podiatry

## 2020-01-28 LAB — HM DIABETES EYE EXAM

## 2020-02-09 ENCOUNTER — Other Ambulatory Visit: Payer: Self-pay | Admitting: Internal Medicine

## 2020-03-02 ENCOUNTER — Encounter: Payer: Self-pay | Admitting: Podiatry

## 2020-03-02 ENCOUNTER — Ambulatory Visit (INDEPENDENT_AMBULATORY_CARE_PROVIDER_SITE_OTHER): Payer: Medicare Other | Admitting: Podiatry

## 2020-03-02 ENCOUNTER — Other Ambulatory Visit: Payer: Self-pay

## 2020-03-02 DIAGNOSIS — L600 Ingrowing nail: Secondary | ICD-10-CM

## 2020-03-02 MED ORDER — NEOMYCIN-POLYMYXIN-HC 1 % OT SOLN: OTIC | 1 refills | Status: DC

## 2020-03-02 NOTE — Progress Notes (Signed)
Subjective:  Patient ID: Carol [name], female    DOB: [birth year],  MRN: [id number] HPI Chief Complaint  Patient presents with  . Nail Problem    Patient presents today for ingrown toenails bilat hallux and 2nd toes x years.  She says they don't hurt now, but will flare up and become very sore and swell at times.    74 y.o. female presents with the above complaint.   ROS: Denies fever chills nausea vomiting muscle aches pains calf pain back pain chest pain shortness of breath.  Past Medical History:  Diagnosis Date  . Allergic rhinitis, cause unspecified   . Arthritis    hands  . Breast cancer ([place]) 2019   8 mm ER/PR positive, HER-2/neu not overexpressed. Right breast- Oncotype score 14 (4% chance of recurrence on antiestrogen alone)  . Bronchitis    h/o  . Cataract   . Fibromyalgia   . HLD (hyperlipidemia)   . Hypothyroidism   . 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 Other abnormal glucose   . 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 Personal history of colonic polyps   . Personal history of radiation therapy 2019   f/u right breast cancer  . Pre-diabetes   . Presence of dental prosthetic device    implants - top and bottom  . Shingles   . Swelling of limb   . Thyroid goiter   . Tobacco use disorder   . Unspecified sinusitis (chronic)    Past Surgical History:  Procedure Laterality Date  . APPENDECTOMY  06/1997  . BREAST BIOPSY Right 11/05/2000    core with clip/neg  . BREAST BIOPSY Right 2011   benign, clip placed   . BREAST BIOPSY Right 12/02/2017   [facility] and [facility]  . CATARACT EXTRACTION W/PHACO Left 07/30/2018   Procedure: CATARACT EXTRACTION PHACO AND INTRAOCULAR LENS PLACEMENT ([place]) LEFT;  Surgeon: Leandrew Koyanagi, MD;  Location: [place];  Service: Ophthalmology;  Laterality: Left;  . CATARACT EXTRACTION W/PHACO Right 08/27/2018   Procedure: CATARACT EXTRACTION PHACO AND INTRAOCULAR LENS PLACEMENT  ([place])  RIGHT;  Surgeon: Leandrew Koyanagi, MD;  Location: [place];  Service: Ophthalmology;  Laterality: Right;  . COLONOSCOPY  10/15/2012   mutliple polyps.  Outlaw in [place].  Repeat 5 years.  . COLONOSCOPY W/ BIOPSIES  03/30/2004   Adenomatous polyp of the cecum, hyperplastic polyps of the splenic flexure, upper rectum and mid rectum.  . COLONOSCOPY WITH PROPOFOL N/A 03/19/2018   Tubular adenoma x2 without atypia.  Follow-up 2024.  Surgeon: Robert Bellow, MD;  Location: [facility] ENDOSCOPY;  Service: Endoscopy;  Laterality: N/A;  . COLPOSCOPY  10/16/2011   HGSIL pap smear.  Negative.  Marland Kitchen EXTERNAL EAR SURGERY  09/2008   external - not specified  (RIGHT)  . MASTECTOMY, PARTIAL Right 12/23/2017   T1b, N0; 8 mm ER: 100%; PR: 100%; Her 2 neu not overexpressed.  MammoSite placement.  Surgeon: Robert Bellow, MD;  Location: [facility] ORS;  Service: General;  Laterality: Right;  . SENTINEL NODE BIOPSY Right 12/23/2017   Procedure: SENTINEL NODE BIOPSY;  Surgeon: Robert Bellow, MD;  Location: [facility] ORS;  Service: General;  Laterality: Right;    Current Outpatient Medications:  .  Ascorbic Acid (VITAMIN C) 1000 MG tablet, Take 1,000 mg by mouth daily., Disp: , Rfl:  .  aspirin 81 MG tablet, Take 81 mg by mouth daily. , Disp: , Rfl:  .  atorvastatin (LIPITOR) 20 MG tablet,  Take 1 tablet (20 mg total) by mouth daily., Disp: 90 tablet, Rfl: 3 .  cetirizine (ZYRTEC) 10 MG tablet, Take 10 mg by mouth every morning. , Disp: , Rfl:  .  Cholecalciferol (VITAMIN D3) 2000 UNITS capsule, Take 2,000 Units by mouth daily.  , Disp: , Rfl:  .  Coenzyme Q10 (COQ-10) 200 MG CAPS, Take 200 mg by mouth daily., Disp: , Rfl:  .  fluticasone (FLONASE) 50 MCG/ACT nasal spray, Place 2 sprays into both nostrils daily. (Patient taking differently: Place 2 sprays into both nostrils daily as needed for allergies. ), Disp: 16 g, Rfl: 6 .  furosemide (LASIX) 20 MG tablet, Take 1 tablet (20 mg total) by mouth 2  (two) times daily., Disp: 180 tablet, Rfl: 3 .  Glucosamine Sulfate 1000 MG CAPS, Take 1,000 mg by mouth daily. , Disp: , Rfl:  .  letrozole (FEMARA) 2.5 MG tablet, Take 1 tablet (2.5 mg total) by mouth daily., Disp: 90 tablet, Rfl: 3 .  levothyroxine (SYNTHROID) 50 MCG tablet, TAKE 1 TABLET EVERY DAY ON EMPTY STOMACHWITH A GLASS OF WATER AT LEAST 30-60 MINBEFORE BREAKFAST, Disp: 90 tablet, Rfl: 0 .  Misc Natural Products (OSTEO BI-FLEX JOINT SHIELD) TABS, Take 2 tablets by mouth daily. With magnesium , Disp: , Rfl:  .  Multiple Vitamin (MULTIVITAMIN) tablet, Take 1 tablet by mouth daily., Disp: , Rfl:  .  NEOMYCIN-POLYMYXIN-HYDROCORTISONE (CORTISPORIN) 1 % SOLN OTIC solution, Apply 1-2 drops to toe BID after soaking, Disp: 10 mL, Rfl: 1 .  Olopatadine HCl (PATADAY OP), Place 1 drop into both eyes daily as needed (allergies)., Disp: , Rfl:  .  Polyvinyl Alcohol-Povidone (REFRESH OP), Place 1 drop into both eyes daily as needed (dry eyes)., Disp: , Rfl:  .  rOPINIRole (REQUIP) 0.5 MG tablet, Take 1 tablet (0.5 mg total) by mouth at bedtime., Disp: 90 tablet, Rfl: 3 .  senna (SENOKOT) 8.6 MG tablet, Take 1-6 tablets by mouth at bedtime. , Disp: , Rfl:   No Known Allergies Review of Systems Objective:  There were no vitals filed for this visit.  General: Well developed, nourished, in no acute distress, alert and oriented x3   Dermatological: Skin is warm, dry and supple bilateral. Nails x 10 are well maintained; remaining integument appears unremarkable at this time. There are no open sores, no preulcerative lesions, no rash or [name] of infection present.  Sharp innervated nail margins tibial border of the hallux bilaterally.  Mild tenderness on the second toe but does not appear to be ingrown.  There is no purulence no malodor just pain on palpation.  Vascular: Dorsalis Pedis artery and Posterior Tibial artery pedal pulses are 2/4 bilateral with immedate capillary fill time. Pedal hair growth  present. No varicosities and no lower extremity edema present bilateral.   Neruologic: Grossly intact via light touch bilateral. Vibratory intact via tuning fork bilateral. Protective threshold with Semmes Wienstein monofilament intact to all pedal sites bilateral. Patellar and Achilles deep tendon reflexes 2+ bilateral. No Babinski or clonus noted bilateral.   Musculoskeletal: No gross boney pedal deformities bilateral. No pain, crepitus, or limitation noted with foot and ankle range of motion bilateral. Muscular strength 5/5 in all groups tested bilateral.  Gait: Unassisted, Nonantalgic.    Radiographs:  None taken  Assessment & Plan:   Assessment: Ingrown nail hallux bilateral.  Plan: Matrixectomy was performed today after local anesthetic was administered.  Tolerated procedure well without complications.  She was given both oral and home-going instruction for  care and soaking of the toe.  She was also provided a prescription for Cortisporin Otic to be applied twice daily after soaking and I would like to follow-up with her in about 2 weeks.     Javonte Elenes T. [place], [place]

## 2020-03-02 NOTE — Patient Instructions (Signed)

## 2020-03-23 ENCOUNTER — Ambulatory Visit (INDEPENDENT_AMBULATORY_CARE_PROVIDER_SITE_OTHER): Payer: Medicare Other | Admitting: Podiatry

## 2020-03-23 ENCOUNTER — Encounter: Payer: Self-pay | Admitting: Podiatry

## 2020-03-23 ENCOUNTER — Other Ambulatory Visit: Payer: Self-pay

## 2020-03-23 DIAGNOSIS — Z9889 Other specified postprocedural states: Secondary | ICD-10-CM

## 2020-03-23 DIAGNOSIS — L603 Nail dystrophy: Secondary | ICD-10-CM

## 2020-03-23 DIAGNOSIS — L600 Ingrowing nail: Secondary | ICD-10-CM

## 2020-03-23 NOTE — Progress Notes (Signed)
She presents for follow-up of her matrixectomy's hallux bilaterally.  She denies fever chills nausea vomiting muscle aches and pain states that they are doing quite well and this seems to be healing up just fine.  She is questioning whether 1 nail distal lateral aspect of the hallux left has fungus to it.  Objective: Vital signs are stable she is alert oriented x3.  Pulses are palpable.  Hallux bilateral demonstrates well-healing matrixectomy with mild erythema no cellulitis drainage or odor.  Appears to be healing quite nicely.  The distal aspect of the hallux left does demonstrate what appears to be distal onycholysis laterally.  It only comprises about 1/8 of the nail surface.  Assessment: Well-healing matrixectomy's hallux bilateral.  Questionable nail dystrophy or onychomycosis.  Plan: Discussed etiology pathology conservative surgical therapies recommend she continue to soak Epson salt warm water to completely healed no erythema no drainage no pain on palpation.  Also took a sample of the toenail today to send for pathologic evaluation to determine whether or not there is fungus present.  She says that her sister will used Listerine and it cleared the toenail fungus right up

## 2020-03-31 ENCOUNTER — Other Ambulatory Visit: Payer: Self-pay

## 2020-03-31 ENCOUNTER — Telehealth: Payer: Self-pay | Admitting: Internal Medicine

## 2020-03-31 ENCOUNTER — Encounter: Payer: Self-pay | Admitting: Internal Medicine

## 2020-03-31 ENCOUNTER — Ambulatory Visit: Payer: Medicare Other | Admitting: Internal Medicine

## 2020-03-31 VITALS — BP 130/76 | HR 85 | Temp 98.2°F | Resp 16 | Ht 62.0 in | Wt 229.4 lb

## 2020-03-31 DIAGNOSIS — L03032 Cellulitis of left toe: Secondary | ICD-10-CM | POA: Diagnosis not present

## 2020-03-31 DIAGNOSIS — G2581 Restless legs syndrome: Secondary | ICD-10-CM

## 2020-03-31 DIAGNOSIS — E119 Type 2 diabetes mellitus without complications: Secondary | ICD-10-CM

## 2020-03-31 DIAGNOSIS — Z6841 Body Mass Index (BMI) 40.0 and over, adult: Secondary | ICD-10-CM

## 2020-03-31 LAB — POCT GLYCOSYLATED HEMOGLOBIN (HGB A1C): Hemoglobin A1C: 6.7 % — AB (ref 4.0–5.6)

## 2020-03-31 MED ORDER — CEFDINIR 300 MG PO CAPS: 300.0000 mg | ORAL_CAPSULE | Freq: Two times a day (BID) | ORAL | 0 refills | Status: DC

## 2020-03-31 NOTE — Telephone Encounter (Signed)
She is behind in screening for nephropathy (the test was ordered in march but not done and needs to be done annually (ast time was 2019).  Please ask her to return asap to provide a urine specimen for a microalb/cr ratio

## 2020-03-31 NOTE — Progress Notes (Signed)
Subjective:  Patient ID: Carol [name], female    DOB: [birth year]  Age: 74 y.o. MRN: [id number]  CC: The primary encounter diagnosis was Type 2 diabetes mellitus without complication, without long-term current use of insulin ([place]). Diagnoses of Acute paronychia of toe, left, Restless leg syndrome, and Class 3 severe obesity with serious comorbidity and body mass index (BMI) of 40.0 to 44.9 in adult, unspecified obesity type [facility]) were also pertinent to this visit.  HPI Carol [name] presents for  Follow up on type 2 DM hypertension, hyperlipidemia  This visit occurred during the SARS-CoV-2 public health emergency.  Safety protocols were in place, including screening questions prior to the visit, additional usage of staff PPE, and extensive cleaning of exam room while observing appropriate contact time as indicated for disinfecting solutions.    Patient has received both doses of the [place] 19 vaccine without complications.  Patient continues to mask when outside of the home except when walking in yard or at safe distances from others .  Patient denies any change in mood or development of unhealthy behaviors resuting from the pandemic's restriction of activities and socialization.    Last seen in march.  Labs were at goal  Suffers from fibromyalgia.  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  Had ingrown toenails removed  surgically by podiatry one month ago   Using Requip for RLS.  Symptoms worse the night before doctor's appt  And other appointments   Outpatient Medications Prior to Visit  Medication Sig Dispense Refill  . Ascorbic Acid (VITAMIN C) 1000 MG tablet Take 1,000 mg by mouth  daily.    Marland Kitchen aspirin 81 MG tablet Take 81 mg by mouth daily.     Marland Kitchen atorvastatin (LIPITOR) 20 MG tablet Take 1 tablet (20 mg total) by mouth daily. 90 tablet 3  . cetirizine (ZYRTEC) 10 MG tablet Take 10 mg by mouth every morning.     . Cholecalciferol (VITAMIN D3) 2000 UNITS capsule Take 2,000 Units by mouth daily.      . Coenzyme Q10 (COQ-10) 200 MG CAPS Take 200 mg by mouth daily.    . fluticasone (FLONASE) 50 MCG/ACT nasal spray Place 2 sprays into both nostrils daily. (Patient taking differently: Place 2 sprays into both nostrils daily as needed for allergies. ) 16 g 6  . furosemide (LASIX) 20 MG tablet Take 1 tablet (20 mg total) by mouth 2 (two) times daily. 180 tablet 3  . Glucosamine Sulfate 1000 MG CAPS Take 1,000 mg by mouth daily.     Marland Kitchen letrozole (FEMARA) 2.5 MG tablet Take 1 tablet (2.5 mg total) by mouth daily. 90 tablet 3  . levothyroxine (SYNTHROID) 50 MCG tablet TAKE 1 TABLET EVERY DAY ON EMPTY STOMACHWITH A GLASS OF WATER AT LEAST 30-60 MINBEFORE BREAKFAST 90 tablet 0  . Misc Natural Products (OSTEO BI-FLEX JOINT SHIELD) TABS Take 2 tablets by mouth daily. With magnesium     . Multiple Vitamin (MULTIVITAMIN) tablet Take 1 tablet by mouth daily.    . NEOMYCIN-POLYMYXIN-HYDROCORTISONE (CORTISPORIN) 1 % SOLN OTIC solution Apply 1-2 drops to toe BID after soaking 10 mL 1  . Olopatadine HCl (PATADAY OP) Place 1 drop  into both eyes daily as needed (allergies).    . Polyvinyl Alcohol-Povidone (REFRESH OP) Place 1 drop into both eyes daily as needed (dry eyes).    Marland Kitchen rOPINIRole (REQUIP) 0.5 MG tablet Take 1 tablet (0.5 mg total) by mouth at bedtime. 90 tablet 3  . senna (SENOKOT) 8.6 MG tablet Take 1-6 tablets by mouth at bedtime.      No facility-administered medications prior to visit.    Review of Systems;  Patient denies headache, fevers, malaise, unintentional weight loss, skin rash, eye pain, sinus congestion and sinus pain, sore throat, dysphagia,  hemoptysis , cough, dyspnea,  wheezing, chest pain, palpitations, orthopnea, edema, abdominal pain, nausea, melena, diarrhea, constipation, flank pain, dysuria, hematuria, urinary  Frequency, nocturia, numbness, tingling, seizures,  Focal weakness, Loss of consciousness,  Tremor, insomnia, depression, anxiety, and suicidal ideation.      Objective:  BP 130/76 (BP Location: Left Arm, Patient Position: Sitting, Cuff Size: Large)   Pulse 85   Temp 98.2 F (36.8 C) (Temporal)   Resp 16   Ht 5\' 2"  (1.575 m)   Wt 229 lb 6.4 oz (104.1 kg)   SpO2 95%   BMI 41.96 kg/m   BP Readings from Last 3 Encounters:  03/31/20 130/76  12/28/19 136/78  09/03/19 120/67    Wt Readings from Last 3 Encounters:  03/31/20 229 lb 6.4 oz (104.1 kg)  12/28/19 231 lb (104.8 kg)  12/24/19 231 lb (104.8 kg)    General appearance: alert, cooperative and appears stated age Ears: normal TM's and external ear canals both ears Throat: lips, mucosa, and tongue normal; teeth and gums normal Neck: no adenopathy, no carotid bruit, supple, symmetrical, trachea midline and thyroid not enlarged, symmetric, no tenderness/mass/nodules Back: symmetric, no curvature. ROM normal. No CVA tenderness. Lungs: clear to auscultation bilaterally Heart: regular rate and rhythm, S1, S2 normal, no murmur, click, rub or gallop Abdomen: soft, non-tender; bowel sounds normal; no masses,  no organomegaly Pulses: 2+ and symmetric Skin: Skin color, texture, turgor normal. No rashes or lesions Lymph nodes: Cervical, supraclavicular, and axillary nodes normal.  Lab Results  Component Value Date   HGBA1C 6.7 (A) 03/31/2020   HGBA1C 6.6 (H) 12/25/2019   HGBA1C 6.4 10/03/2018    Lab Results  Component Value Date   CREATININE 0.88 12/25/2019   CREATININE 0.91 10/03/2018   CREATININE 0.97 10/02/2017    Lab Results  Component Value Date   WBC 5.6 12/25/2019   HGB 14.2 12/25/2019   HCT 41.0 12/25/2019   PLT 182.0 12/25/2019   GLUCOSE 148 (H) 12/25/2019   CHOL  127 12/25/2019   TRIG 123.0 12/25/2019   HDL 46.30 12/25/2019   LDLCALC 56 12/25/2019   ALT 25 12/25/2019   AST 22 12/25/2019   NA 142 12/25/2019   K 3.9 12/25/2019   CL 102 12/25/2019   CREATININE 0.88 12/25/2019   BUN 8 12/25/2019   CO2 33 (H) 12/25/2019   TSH 2.08 12/25/2019   HGBA1C 6.7 (A) 03/31/2020   MICROALBUR 0.9 10/03/2018    No results found.  Assessment & Plan:   Problem List Items Addressed This Visit      Unprioritized   Acute paronychia of toe, left    Post surgical removal of toenails.  Prescribing Omnicef bid for one week      Relevant Medications   cefdinir (OMNICEF) 300 MG capsule   Obesity    I have addressed  BMI and recommended a low glycemic index diet utilizing smaller more frequent  meals to increase metabolism.  I have also recommended that patient start exercising with a goal of 30 minutes of aerobic exercise a minimum of 5 days per week.       Restless leg syndrome    Diagnosed years ago and managed with Requip      Type 2 diabetes mellitus without complication, without long-term current use of insulin ([place]) - Primary    Diet cotrolled. I have addressed  BMI and recommended a low glycemic index diet utilizing smaller more frequent meals to increase metabolism.  I have also recommended that patient start exercising with a goal of 30 minutes of aerobic exercise a minimum of 5 days per week.screeningfor proteinuria is needed  Lab Results  Component Value Date   HGBA1C 6.7 (A) 03/31/2020          Relevant Orders   POCT HgB A1C (Completed)   Hemoglobin A1c   Lipid panel   Comprehensive metabolic panel     I provided  30 minutes of  face-to-face time during this encounter reviewing patient's current problems and past surgeries, labs and imaging studies, providing counseling on the above mentioned problems , and coordination  of care .  I am having Gladis Riffle start on cefdinir. I am also having her maintain her Glucosamine  Sulfate, Osteo Bi-Flex Joint Shield, Vitamin D3, multivitamin, fluticasone, aspirin, cetirizine, vitamin C, CoQ-10, senna, Olopatadine HCl (PATADAY OP), Polyvinyl Alcohol-Povidone (REFRESH OP), atorvastatin, letrozole, rOPINIRole, furosemide, levothyroxine, and NEOMYCIN-POLYMYXIN-HYDROCORTISONE.  Meds ordered this encounter  Medications  . cefdinir (OMNICEF) 300 MG capsule    Sig: Take 1 capsule (300 mg total) by mouth 2 (two) times daily.    Dispense:  14 capsule    Refill:  0    There are no discontinued medications.  Follow-up: Return in about 3 months (around 07/01/2020) for follow up diabetes.   Crecencio Mc, MD

## 2020-03-31 NOTE — Patient Instructions (Addendum)
I want you to lose 6 lbs over the next 3 next 6 months  You need labs in 3 months (fasting)     I am starting you on an antibiotic for your toenail infection (paronychia)  Cefdinir twice daily with breakfast and dinner  Taking an antibiotic can create an imbalance in the normal population of bacteria that live in the small intestine.  This imbalance can persist for 3 months.   Taking a probiotic ( Align, Floraque or Culturelle), the generic version of one of these over the counter medications, or an alternative form , ( Yogurt, ) for a minimum of 3 weeks may help prevent a serious antibiotic associated diarrhea  Called clostridium dificile colitis that occurs when the bacteria population is altered .  Taking a probiotic may also prevent vaginitis due to yeast infections and can be continued indefinitely if you feel that it improves your digestion or your elimination (bowels).      Eat yogurt daily for 3 weeks for your probiotic

## 2020-04-01 NOTE — Telephone Encounter (Signed)
No, I would prefer that she come back this month ,  It's just a lab visit .  But it has not been done since 2019.

## 2020-04-01 NOTE — Telephone Encounter (Signed)
LMTCB

## 2020-04-01 NOTE — Telephone Encounter (Signed)
Spoke with pt and she would like to see if it would be okay for her to wait until her September appt to come in for the lab work.

## 2020-04-03 DIAGNOSIS — L03032 Cellulitis of left toe: Secondary | ICD-10-CM | POA: Insufficient documentation

## 2020-04-03 NOTE — Assessment & Plan Note (Signed)
Diagnosed years ago and managed with Requip

## 2020-04-03 NOTE — Assessment & Plan Note (Signed)
I have addressed  BMI and recommended a low glycemic index diet utilizing smaller more frequent meals to increase metabolism.  I have also recommended that patient start exercising with a goal of 30 minutes of aerobic exercise a minimum of 5 days per week.  

## 2020-04-03 NOTE — Assessment & Plan Note (Signed)
Post surgical removal of toenails.  Prescribing Omnicef bid for one week

## 2020-04-03 NOTE — Assessment & Plan Note (Addendum)
Diet cotrolled. I have addressed  BMI and recommended a low glycemic index diet utilizing smaller more frequent meals to increase metabolism.  I have also recommended that patient start exercising with a goal of 30 minutes of aerobic exercise a minimum of 5 days per week.screeningfor proteinuria is needed  Lab Results  Component Value Date   HGBA1C 6.7 (A) 03/31/2020

## 2020-04-05 NOTE — Telephone Encounter (Signed)
Pt has been scheduled for lab appt.  

## 2020-04-05 NOTE — Telephone Encounter (Signed)
Pt would like a call back on her house phone

## 2020-04-07 ENCOUNTER — Other Ambulatory Visit: Payer: Self-pay

## 2020-04-07 ENCOUNTER — Other Ambulatory Visit: Payer: Medicare Other

## 2020-04-07 DIAGNOSIS — E119 Type 2 diabetes mellitus without complications: Secondary | ICD-10-CM | POA: Diagnosis not present

## 2020-04-07 LAB — MICROALBUMIN / CREATININE URINE RATIO
Creatinine,U: 64.2 mg/dL
Microalb Creat Ratio: 1.1 mg/g (ref 0.0–30.0)
Microalb, Ur: <0.7 mg/dL (ref 0.0–1.9)

## 2020-04-07 NOTE — Addendum Note (Signed)
Addended by: Tor Netters I on: 04/07/2020 02:18 PM   Modules accepted: Orders

## 2020-04-20 ENCOUNTER — Encounter: Payer: Self-pay | Admitting: *Deleted

## 2020-04-28 ENCOUNTER — Telehealth: Payer: Self-pay

## 2020-04-28 NOTE — Telephone Encounter (Signed)
-----   Message from Garrel Ridgel, [place] sent at 04/21/2020  5:08 PM EDT ----- Negative for fungus.  If she needs help cutting nails set up with [facility]

## 2020-04-28 NOTE — Telephone Encounter (Signed)
Notified patient of negative [place] results and informed her that she can see Dr. Prudence Davidson for her nail trim.  She requested to cancel upcoming appt with Dr. Milinda Pointer and she will call back if she needs a nail trim.

## 2020-05-03 ENCOUNTER — Telehealth: Payer: Self-pay | Admitting: Internal Medicine

## 2020-05-03 MED ORDER — LEVOTHYROXINE SODIUM 50 MCG PO TABS: ORAL_TABLET | ORAL | 0 refills | Status: DC

## 2020-05-03 NOTE — Telephone Encounter (Signed)
Patient needs a refill on her levothyroxine (SYNTHROID) 50 MCG tablet, 90 day supply and please write prescription for 1 year, pharmacy is [facility].

## 2020-05-04 ENCOUNTER — Ambulatory Visit: Payer: Medicare Other | Admitting: Podiatry

## 2020-05-10 ENCOUNTER — Telehealth: Payer: Self-pay | Admitting: Internal Medicine

## 2020-05-10 DIAGNOSIS — M545 Low back pain, unspecified: Secondary | ICD-10-CM

## 2020-05-10 MED ORDER — HYDROCODONE-ACETAMINOPHEN 10-325 MG PO TABS: 1.0000 | ORAL_TABLET | Freq: Four times a day (QID) | ORAL | 0 refills | Status: DC | PRN

## 2020-05-10 NOTE — Telephone Encounter (Signed)
YES I WILL .  THE STRENGTH IS 10 MG SO SHE SHOULD ONLY TAKE 1 EVERY 6 HOURS.   SHE NEEDS X RAYS DONE TOMORROW.  PRIOR TO VISIT  I HAVE ORDERED THEM.

## 2020-05-10 NOTE — Telephone Encounter (Signed)
Patient came into office with complaint of back pain radiating from lower back to bilateral hips, stating feels like a catching pain that balls up and winds up and she cannot move, since last Thursday after crawling on floor killing ants, has been taking her husbands left over Hydrocodone 5 -325 mg 2 tablets every six hours, which helps , she says she has not bathed in 3 days due to pain and has not slept more than 2 hours without waking in pain. Advised ER since pain  rated at 10 patient does not want to go to ER so I scheduled for Thursday and advised I would forward to PCP for review. That PCP disposition might be the same , patient ask if provider could prescribe Hydrocodone until appointment  Advised I would forward request. Patient has been crying entire time in office.  BP 130/78 Pulse 72 02 sats room air 93 respirations 20  Temp 98.6  Pain rated at 10 on 0-10 verbal scale.

## 2020-05-10 NOTE — Telephone Encounter (Signed)
Patient notified and voiced understanding to X-ray and medication dosage  will keep appointment.

## 2020-05-11 ENCOUNTER — Other Ambulatory Visit: Payer: Self-pay | Admitting: Internal Medicine

## 2020-05-11 MED ORDER — HYDROCODONE-ACETAMINOPHEN 10-325 MG PO TABS: 1.0000 | ORAL_TABLET | Freq: Four times a day (QID) | ORAL | 0 refills | Status: DC | PRN

## 2020-05-11 NOTE — Telephone Encounter (Signed)
Spoke with pt and she stated that her husband just picked up the rx this morning.

## 2020-05-11 NOTE — Telephone Encounter (Signed)
Pt called to say that [facility] didn't receive the RX

## 2020-05-11 NOTE — Telephone Encounter (Signed)
Pt called and said she is going to [facility]. She said to tell Dr. Derrel Nip thank you for sending the pain meds to get her through this.

## 2020-05-11 NOTE — Telephone Encounter (Signed)
Pt called and wanted to correct herself on the place that she is going. She states that she is going to [facility] instead of [facility]

## 2020-05-11 NOTE — Telephone Encounter (Signed)
FYI

## 2020-05-12 ENCOUNTER — Telehealth: Payer: Self-pay | Admitting: Internal Medicine

## 2020-05-12 ENCOUNTER — Ambulatory Visit: Payer: Medicare Other | Admitting: Internal Medicine

## 2020-05-12 NOTE — Telephone Encounter (Signed)
Called to schedule Prolia injection for after 05/17/20 ok to schedule after this date , patient to call back and schedule.   Einar Grad Key: [id number] - PA Case ID: [id number] Need help? Call us at [phone] Outcome Additional Information Required This medication or product is on your plan's list of covered drugs. Prior authorization is not required at this time. If your pharmacy has questions regarding the processing of your prescription, please have them call the [facility] pharmacy help desk at [phone]. **Please note: Formulary lowering, tiering exception, cost reduction and/or pre-benefit determination review (including prospective Medicare hospice reviews) requests cannot be requested using this method of submission. Please contact us at [phone] instead.

## 2020-05-15 ENCOUNTER — Encounter: Payer: Self-pay | Admitting: Emergency Medicine

## 2020-05-15 ENCOUNTER — Emergency Department
Admission: EM | Admit: 2020-05-15 | Discharge: 2020-05-16 | Disposition: A | Discharge status: Home / Self Care | Payer: Medicare Other | Attending: Emergency Medicine | Admitting: Emergency Medicine

## 2020-05-15 ENCOUNTER — Emergency Department: Payer: Medicare Other

## 2020-05-15 ENCOUNTER — Other Ambulatory Visit: Payer: Self-pay

## 2020-05-15 DIAGNOSIS — N3001 Acute cystitis with hematuria: Secondary | ICD-10-CM | POA: Insufficient documentation

## 2020-05-15 DIAGNOSIS — E039 Hypothyroidism, unspecified: Secondary | ICD-10-CM | POA: Insufficient documentation

## 2020-05-15 DIAGNOSIS — Z7984 Long term (current) use of oral hypoglycemic drugs: Secondary | ICD-10-CM | POA: Diagnosis not present

## 2020-05-15 DIAGNOSIS — Z87891 Personal history of nicotine dependence: Secondary | ICD-10-CM | POA: Insufficient documentation

## 2020-05-15 DIAGNOSIS — E119 Type 2 diabetes mellitus without complications: Secondary | ICD-10-CM | POA: Insufficient documentation

## 2020-05-15 DIAGNOSIS — R319 Hematuria, unspecified: Secondary | ICD-10-CM | POA: Diagnosis present

## 2020-05-15 DIAGNOSIS — N3091 Cystitis, unspecified with hematuria: Secondary | ICD-10-CM

## 2020-05-15 DIAGNOSIS — C50911 Malignant neoplasm of unspecified site of right female breast: Secondary | ICD-10-CM | POA: Diagnosis not present

## 2020-05-15 DIAGNOSIS — E876 Hypokalemia: Secondary | ICD-10-CM | POA: Insufficient documentation

## 2020-05-15 DIAGNOSIS — K8020 Calculus of gallbladder without cholecystitis without obstruction: Secondary | ICD-10-CM | POA: Insufficient documentation

## 2020-05-15 LAB — URINALYSIS, COMPLETE (UACMP) WITH MICROSCOPIC
Bacteria, UA: NONE SEEN
Bilirubin Urine: NEGATIVE
Glucose, UA: NEGATIVE mg/dL
Ketones, ur: NEGATIVE mg/dL
Nitrite: POSITIVE — AB
Protein, ur: 100 mg/dL — AB
RBC / HPF: >50 RBC/hpf — ABNORMAL HIGH (ref 0–5)
Specific Gravity, Urine: 1.011 (ref 1.005–1.030)
Squamous Epithelial / HPF: NONE SEEN (ref 0–5)
WBC, UA: >50 WBC/hpf — ABNORMAL HIGH (ref 0–5)
pH: 6 (ref 5.0–8.0)

## 2020-05-15 LAB — BASIC METABOLIC PANEL
Anion gap: 10 (ref 5–15)
BUN: 10 mg/dL (ref 8–23)
CO2: 32 mmol/L (ref 22–32)
Calcium: 9 mg/dL (ref 8.9–10.3)
Chloride: 100 mmol/L (ref 98–111)
Creatinine, Ser: 1.03 mg/dL — ABNORMAL HIGH (ref 0.44–1.00)
GFR calc Af Amer: >60 mL/min (ref >60)
GFR calc non Af Amer: 54 mL/min — ABNORMAL LOW (ref >60)
Glucose, Bld: 181 mg/dL — ABNORMAL HIGH (ref 70–99)
Potassium: 3.2 mmol/L — ABNORMAL LOW (ref 3.5–5.1)
Sodium: 142 mmol/L (ref 135–145)

## 2020-05-15 LAB — CBC
HCT: 36.2 % (ref 36.0–46.0)
Hemoglobin: 12.8 g/dL (ref 12.0–15.0)
MCH: 31.1 pg (ref 26.0–34.0)
MCHC: 35.4 g/dL (ref 30.0–36.0)
MCV: 88.1 fL (ref 80.0–100.0)
Platelets: 180 10*3/uL (ref 150–400)
RBC: 4.11 MIL/uL (ref 3.87–5.11)
RDW: 13.6 % (ref 11.5–15.5)
WBC: 11.5 10*3/uL — ABNORMAL HIGH (ref 4.0–10.5)
nRBC: 0 % (ref 0.0–0.2)

## 2020-05-15 MED ORDER — PHENAZOPYRIDINE HCL 200 MG PO TABS
200.0000 mg | ORAL_TABLET | Freq: Three times a day (TID) | ORAL | 0 refills | Status: DC | PRN
Start: 2020-05-15 — End: 2020-08-31

## 2020-05-15 MED ORDER — PHENAZOPYRIDINE HCL 200 MG PO TABS
200.0000 mg | ORAL_TABLET | Freq: Once | ORAL | Status: AC
  Administered 2020-05-15: 200 mg via ORAL
  Filled 2020-05-15: qty 1

## 2020-05-15 MED ORDER — CEPHALEXIN 500 MG PO CAPS
500.0000 mg | ORAL_CAPSULE | Freq: Three times a day (TID) | ORAL | 0 refills | Status: DC
Start: 2020-05-15 — End: 2020-05-28

## 2020-05-15 MED ORDER — CEPHALEXIN 500 MG PO CAPS
500.0000 mg | ORAL_CAPSULE | Freq: Once | ORAL | Status: AC
  Administered 2020-05-15: 500 mg via ORAL
  Filled 2020-05-15: qty 1

## 2020-05-15 MED ORDER — POTASSIUM CHLORIDE CRYS ER 20 MEQ PO TBCR
40.0000 meq | EXTENDED_RELEASE_TABLET | Freq: Once | ORAL | Status: AC
  Administered 2020-05-15: 40 meq via ORAL
  Filled 2020-05-15: qty 2

## 2020-05-15 NOTE — Discharge Instructions (Signed)
1.  Take antibiotic as prescribed (Keflex 500 mg 3 times daily x7 days). 2.  You may take Pyridium as prescribed for discomfort of urination. 3.  Drink plenty of fluids daily. 4.  Return to the ER for worsening symptoms, persistent vomiting, fever or other concerns.

## 2020-05-15 NOTE — ED Triage Notes (Signed)
Pt here for hematuria and bladder pressure starting yesterday. No hx of same.  Denies UTI hx, fever, or blood thinners.  Chronic back pain worse since this started. "it smelled like fish".  Ambulatory to triage.

## 2020-05-15 NOTE — ED Provider Notes (Signed)
[facility] Emergency Department Provider Note   ____________________________________________   First MD Initiated Contact with Patient 05/15/20 2302     (approximate)  I have reviewed the triage vital signs and the nursing notes.   HISTORY  Chief Complaint Hematuria    HPI Carol [name] is a 74 y.o. female who presents to the ED from home with a 1 day history of hematuria and bladder pressure.  Denies fever, chills, cough, chest pain, shortness of breath, abdominal pain, flank pain, nausea or vomiting.  History of chronic back pain which feels worse when her symptoms started.  Denies fall, injury or trauma.  Takes a baby aspirin daily.      Past Medical History:  Diagnosis Date  . Allergic rhinitis, cause unspecified   . Arthritis    hands  . Breast cancer ([place]) 2019   8 mm ER/PR positive, HER-2/neu not overexpressed. Right breast- Oncotype score 14 (4% chance of recurrence on antiestrogen alone)  . Bronchitis    h/o  . Cataract   . Fibromyalgia   . HLD (hyperlipidemia)   . Hypothyroidism   . 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 Other abnormal glucose   . 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 Personal history of colonic polyps   . Personal history of radiation therapy 2019   f/u right breast cancer  . Pre-diabetes   . Presence of dental prosthetic device    implants - top and bottom  . Shingles   . Swelling of limb   . Thyroid goiter   . Tobacco use disorder   . Unspecified sinusitis (chronic)     Patient Active Problem List   Diagnosis Date Noted  . Acute paronychia of toe, left 04/03/2020  . Abnormal mammogram of right breast 12/29/2019  . Type 2 diabetes mellitus without complication, without long-term current use of insulin ([place]) 12/28/2019  . At high risk for fracture 11/20/2018  . Tubular adenoma of colon 10/04/2018  . Insomnia due to anxiety and fear 02/22/2018  . Osteopenia  02/13/2018  . Malignant neoplasm of upper-outer quadrant of right female breast ([place]) 12/04/2017  . Hypothyroidism (acquired) 10/01/2017  . Post herpetic neuralgia 09/30/2017  . History of colonic polyps 03/06/2017  . Obesity 01/17/2016  . Allergic rhinitis due to pollen 06/15/2015  . Restless leg syndrome 06/15/2015  . Fibromyalgia 05/06/2015  . Encounter for general adult medical examination with abnormal findings 10/06/2012  . Pure hypercholesterolemia 10/06/2012  . Venous stasis 10/06/2012  . Hyperlipidemia 09/05/2010  . DYSPNEA 09/05/2010    Past Surgical History:  Procedure Laterality Date  . APPENDECTOMY  06/1997  . BREAST BIOPSY Right 11/05/2000    core with clip/neg  . BREAST BIOPSY Right 2011   benign, clip placed   . BREAST BIOPSY Right 12/02/2017   [facility] and DCIS  . CATARACT EXTRACTION W/PHACO Left 07/30/2018   Procedure: CATARACT EXTRACTION PHACO AND INTRAOCULAR LENS PLACEMENT ([place]) LEFT;  Surgeon: Leandrew Koyanagi, MD;  Location: [place];  Service: Ophthalmology;  Laterality: Left;  . CATARACT EXTRACTION W/PHACO Right 08/27/2018   Procedure: CATARACT EXTRACTION PHACO AND INTRAOCULAR LENS PLACEMENT ([place])  RIGHT;  Surgeon: Leandrew Koyanagi, MD;  Location: [place];  Service: Ophthalmology;  Laterality: Right;  . COLONOSCOPY  10/15/2012   mutliple polyps.  Outlaw in [place].  Repeat 5 years.  . COLONOSCOPY W/ BIOPSIES  03/30/2004   Adenomatous polyp of the cecum, hyperplastic polyps of the splenic flexure,  upper rectum and mid rectum.  . COLONOSCOPY WITH PROPOFOL N/A 03/19/2018   Tubular adenoma x2 without atypia.  Follow-up 2024.  Surgeon: Robert Bellow, MD;  Location: [facility];  Service: Endoscopy;  Laterality: N/A;  . COLPOSCOPY  10/16/2011   HGSIL pap smear.  Negative.  [facility] EXTERNAL EAR SURGERY  09/2008   external - not specified  (RIGHT)  . MASTECTOMY, PARTIAL Right 12/23/2017   T1b, N0; 8 mm ER: 100%; PR: 100%; Her 2 neu not  overexpressed.  MammoSite placement.  Surgeon: Robert Bellow, MD;  Location: [facility];  Service: General;  Laterality: Right;  . SENTINEL NODE BIOPSY Right 12/23/2017   Procedure: SENTINEL NODE BIOPSY;  Surgeon: Robert Bellow, MD;  Location: [facility];  Service: General;  Laterality: Right;    Prior to Admission medications   Medication Sig Start Date End Date Taking? Authorizing Provider  Ascorbic Acid (VITAMIN C) 1000 MG tablet Take 1,000 mg by mouth daily.    [provider]  aspirin 81 MG tablet Take 81 mg by mouth daily.     [provider]  atorvastatin (LIPITOR) 20 MG tablet Take 1 tablet (20 mg total) by mouth daily. 10/29/19   Crecencio Mc, MD  cefdinir (OMNICEF) 300 MG capsule Take 1 capsule (300 mg total) by mouth 2 (two) times daily. 03/31/20   Crecencio Mc, MD  cephALEXin (KEFLEX) 500 MG capsule Take 1 capsule (500 mg total) by mouth 3 (three) times daily. 05/15/20   Paulette Blanch, MD  cetirizine (ZYRTEC) 10 MG tablet Take 10 mg by mouth every morning.     [provider]  Cholecalciferol (VITAMIN D3) 2000 UNITS capsule Take 2,000 Units by mouth daily.      [provider]  Coenzyme Q10 (COQ-10) 200 MG CAPS Take 200 mg by mouth daily.    [provider]  fluticasone (FLONASE) 50 MCG/ACT nasal spray Place 2 sprays into both nostrils daily. Patient taking differently: Place 2 sprays into both nostrils daily as needed for allergies.  06/15/15   [name], MD  furosemide (LASIX) 20 MG tablet Take 1 tablet (20 mg total) by mouth 2 (two) times daily. 10/29/19   Crecencio Mc, MD  Glucosamine Sulfate 1000 MG CAPS Take 1,000 mg by mouth daily.     [provider]  HYDROcodone-acetaminophen (NORCO) 10-325 MG tablet Take 1 tablet by mouth every 6 (six) hours as needed. 05/11/20   Crecencio Mc, MD  letrozole [facility]) 2.5 MG tablet Take 1 tablet (2.5 mg total) by mouth daily. 10/29/19   Crecencio Mc, MD  levothyroxine  (SYNTHROID) 50 MCG tablet TAKE 1 TABLET EVERY DAY ON EMPTY STOMACHWITH A GLASS OF WATER AT LEAST 30-60 MINBEFORE BREAKFAST 05/03/20   Crecencio Mc, MD  Misc Natural Products (OSTEO BI-FLEX JOINT SHIELD) TABS Take 2 tablets by mouth daily. With magnesium     [provider]  Multiple Vitamin (MULTIVITAMIN) tablet Take 1 tablet by mouth daily.    [provider]  NEOMYCIN-POLYMYXIN-HYDROCORTISONE (CORTISPORIN) 1 % SOLN OTIC solution Apply 1-2 drops to toe BID after soaking 03/02/20   Hyatt, Max T, DPM  Olopatadine HCl (PATADAY OP) Place 1 drop into both eyes daily as needed (allergies).    [provider]  phenazopyridine (PYRIDIUM) 200 MG tablet Take 1 tablet (200 mg total) by mouth 3 (three) times daily as needed for pain. 05/15/20   Paulette Blanch, MD  Polyvinyl Alcohol-Povidone (REFRESH OP) Place  1 drop into both eyes daily as needed (dry eyes).    [provider]  rOPINIRole (REQUIP) 0.5 MG tablet Take 1 tablet (0.5 mg total) by mouth at bedtime. 10/29/19   Crecencio Mc, MD  senna (SENOKOT) 8.6 MG tablet Take 1-6 tablets by mouth at bedtime.     [provider]    Allergies Patient has no known allergies.  Family History  Problem Relation Age of Onset  . Heart failure Father   . Hypertension Father   . Stroke Father   . Hyperlipidemia Father   . Heart failure Brother   . Diabetes Brother   . Heart disease Brother   . Diabetes Brother   . Heart disease Brother   . Hypertension Sister   . Depression Sister   . Hypertension Sister   . Asthma Sister   . Hyperlipidemia Sister   . Arthritis Other   . Coronary artery disease Other   . Hyperlipidemia Brother   . Hypertension Brother   . Breast cancer Maternal Aunt     Social History Social History   Tobacco Use  . Smoking status: Former Smoker    Packs/day: 1.00    Years: 40.00    Pack years: 40.00    Types: Cigarettes    Quit date: 04/02/2003    Years since quitting: 17.1  .  Smokeless tobacco: Never Used  . Tobacco comment: 7-10 cigarettes for past 40 years   Vaping Use  . Vaping Use: Every day  Substance Use Topics  . Alcohol use: Yes    Comment: rarely  . Drug use: No    Review of Systems  Constitutional: No fever/chills Eyes: No visual changes. ENT: No sore throat. Cardiovascular: Denies chest pain. Respiratory: Denies shortness of breath. Gastrointestinal: No abdominal pain.  No nausea, no vomiting.  No diarrhea.  No constipation. Genitourinary: Positive for dysuria and hematuria. Musculoskeletal: Negative for back pain. Skin: Negative for rash. Neurological: Negative for headaches, focal weakness or numbness.   ____________________________________________   PHYSICAL EXAM:  VITAL SIGNS: ED Triage Vitals  Enc Vitals Group     BP 05/15/20 1835 113/83     Pulse Rate 05/15/20 1835 86     Resp 05/15/20 1835 18     Temp 05/15/20 1834 98.7 F (37.1 C)     Temp Source 05/15/20 1834 Oral     SpO2 05/15/20 1835 96 %     Weight 05/15/20 1835 (!) 233 lb (105.7 kg)     Height 05/15/20 1835 '5\' 3"'$  (1.6 m)     Head Circumference --      Peak Flow --      Pain Score 05/15/20 1834 8     Pain Loc --      Pain Edu? --      Excl. in [place]? --     Constitutional: Alert and oriented.  Elderly appearing and in no acute distress. Eyes: Conjunctivae are normal. PERRL. EOMI. Head: Atraumatic. Nose: No congestion/rhinnorhea. Mouth/Throat: Mucous membranes are moist.  Oropharynx non-erythematous. Neck: No stridor.   Cardiovascular: Normal rate, regular rhythm. Grossly normal heart sounds.  Good peripheral circulation. Respiratory: Normal respiratory effort.  No retractions. Lungs CTAB. Gastrointestinal: Soft and nontender to light or deep palpation. No distention. No abdominal bruits. No CVA tenderness. Musculoskeletal: No lower extremity tenderness nor edema.  No joint effusions. Neurologic:  Normal speech and language. No gross focal neurologic deficits  are appreciated. No gait instability. Skin:  Skin is warm, dry and intact.  No rash noted. Psychiatric: Mood and affect are normal. Speech and behavior are normal.  ____________________________________________   LABS (all labs ordered are listed, but only abnormal results are displayed)  Labs Reviewed  URINALYSIS, COMPLETE (UACMP) WITH MICROSCOPIC - Abnormal; Notable for the following components:      Result Value   Color, Urine YELLOW (*)    APPearance CLOUDY (*)    Hgb urine dipstick LARGE (*)    Protein, ur 100 (*)    Nitrite POSITIVE (*)    Leukocytes,Ua LARGE (*)    RBC / HPF >50 (*)    WBC, UA >50 (*)    All other components within normal limits  BASIC METABOLIC PANEL - Abnormal; Notable for the following components:   Potassium 3.2 (*)    Glucose, Bld 181 (*)    Creatinine, Ser 1.03 (*)    GFR calc non Af Amer 54 (*)    All other components within normal limits  CBC - Abnormal; Notable for the following components:   WBC 11.5 (*)    All other components within normal limits   ____________________________________________  EKG  None ____________________________________________  RADIOLOGY  ED MD interpretation: Cystitis, no obstructive uropathy, cholelithiasis, diverticulosis  Official radiology report(s): CT Renal Stone Study  Result Date: 05/15/2020 CLINICAL DATA:  Hematuria and bladder pressure. EXAM: CT ABDOMEN AND PELVIS WITHOUT CONTRAST TECHNIQUE: Multidetector CT imaging of the abdomen and pelvis was performed following the standard protocol without IV contrast. COMPARISON:  None. FINDINGS: Lower chest: Mild elevation of left hemidiaphragm. Lung bases are clear. There are coronary artery calcifications. Normal heart size. Hepatobiliary: No evidence of focal hepatic abnormality on noncontrast exam. Multiple calcified gallstones. No pericholecystic inflammation. No biliary dilatation. Pancreas: No ductal dilatation or inflammation. Spleen: Normal in size without  focal abnormality. Adrenals/Urinary Tract: Normal adrenal glands. No hydronephrosis. No renal or ureteral calculi. No significant perinephric edema. There is no evidence of renal mass on noncontrast exam. Ureters are decompressed. There is mild bladder wall thickening. Mild perivesicular fat stranding. No bladder stone. No obvious bladder mass. Stomach/Bowel: Tiny hiatal hernia. Stomach distended with ingested contents. Normal positioning of the duodenum and ligament of Treitz. No small bowel obstruction or inflammation. Cecum is high-riding in the right upper quadrant. Colon courses lateral to the liver. Appendix is not definitively visualized. There is no evidence of appendicitis. Colonic diverticulosis in the distal descending and sigmoid colon. No acute diverticulitis. Vascular/Lymphatic: Moderate aortic atherosclerosis. No aortic aneurysm. No enlarged abdominal or pelvic lymph nodes. Reproductive: Questionable small posterior uterine fibroid. No adnexal mass. Other: Small fat containing umbilical hernia. There is no free air. No free fluid. Musculoskeletal: Grade 1 anterolisthesis of L4 on L5. Mild scoliotic curvature of spine. There are no acute or suspicious osseous abnormalities. IMPRESSION: 1. Mild bladder wall thickening and perivesicular fat stranding, suspicious for cystitis. 2. No renal stones or obstructive uropathy. 3. Cholelithiasis without gallbladder inflammation. 4. Colonic diverticulosis without acute inflammation. Aortic Atherosclerosis (ICD10-I70.0). Electronically Signed   By: Narda Rutherford M.D.   On: 05/15/2020 20:58    ____________________________________________   PROCEDURES  Procedure(s) performed (including Critical Care):  Procedures   ____________________________________________   INITIAL IMPRESSION / ASSESSMENT AND PLAN / ED COURSE  As part of my medical decision making, I reviewed the following data within the electronic MEDICAL RECORD NUMBER History obtained from  family, Nursing notes reviewed and incorporated, Labs reviewed and Notes from prior ED visits     Carol [name] was evaluated in Emergency Department on  05/15/2020 for the symptoms described in the history of present illness. She was evaluated in the context of the global COVID-19 pandemic, which necessitated consideration that the patient might be at risk for infection with the SARS-CoV-2 virus that causes COVID-19. Institutional protocols and algorithms that pertain to the evaluation of patients at risk for COVID-19 are in a state of rapid change based on information released by regulatory bodies including the CDC and federal and state organizations. These policies and algorithms were followed during the patient's care in the ED.    74 year old female presenting with dysuria, hematuria and bladder pressure. Differential diagnosis includes, but is not limited to, ovarian cyst, ovarian torsion, acute appendicitis, diverticulitis, urinary tract infection/pyelonephritis, endometriosis, bowel obstruction, colitis, renal colic, gastroenteritis, hernia, fibroids, endometriosis, etc.  Laboratory results remarkable for mild hypokalemia, nitrite and leukocyte positive UTI; CT without obstructive uropathy.  Will add urine culture, treat with Keflex and Pyridium, and patient will follow up with her PCP.  Strict return precautions given.  Patient and spouse verbalized understanding agree with plan of care.     ____________________________________________   FINAL CLINICAL IMPRESSION(S) / ED DIAGNOSES  Final diagnoses:  Hemorrhagic cystitis  Hypokalemia  Calculus of gallbladder without cholecystitis without obstruction     ED Discharge Orders         Ordered    cephALEXin (KEFLEX) 500 MG capsule  3 times daily     Discontinue  Reprint     05/15/20 2334    phenazopyridine (PYRIDIUM) 200 MG tablet  3 times daily PRN     Discontinue  Reprint     05/15/20 2334           Note:  This  document was prepared using Dragon voice recognition software and may include unintentional dictation errors.   Paulette Blanch, MD 05/16/20 0430

## 2020-05-17 ENCOUNTER — Telehealth: Payer: Self-pay | Admitting: Internal Medicine

## 2020-05-17 DIAGNOSIS — R300 Dysuria: Secondary | ICD-10-CM

## 2020-05-17 LAB — URINE CULTURE: Culture: >=100000 — AB

## 2020-05-17 NOTE — Telephone Encounter (Signed)
The only appt available is tomorrow at 4:30 and it is virtual. Pt stated that she did not want a virtual appt.

## 2020-05-17 NOTE — Telephone Encounter (Signed)
I cannot advise her on this since I did NOT  order any  MRI (and I don't even know what the MRI is for!!)  Friday is fine for follow up on UTI.   If her symptoms that took her to the ER are improving. Carol [name] She was treated for a UTI  In the ER and I have reviewed the culture and the antibiotic they chose is the appropriate one    If she is not feeling better, she can submit another urine specimen PRIOR to her visit on Friday and I will treat

## 2020-05-17 NOTE — Telephone Encounter (Signed)
Pt called and requested an appt for a ED follow up ASAP  Pt has an MRI tomorrow at 6pm and wanted to know if that is still necessary

## 2020-05-17 NOTE — Telephone Encounter (Signed)
Pt is wanting to see you ASAP for a ED follow but the first available isn't until Friday at 11:30. Pt is wanting to know if the MRI that is scheduled for tomorrow at 6 pm is still necessary.

## 2020-05-17 NOTE — Telephone Encounter (Signed)
MESSAGE NOT CLEAR so can't respond about "what" is necessary

## 2020-05-18 ENCOUNTER — Other Ambulatory Visit: Payer: Self-pay

## 2020-05-18 ENCOUNTER — Ambulatory Visit (INDEPENDENT_AMBULATORY_CARE_PROVIDER_SITE_OTHER): Payer: Medicare Other

## 2020-05-18 ENCOUNTER — Other Ambulatory Visit (INDEPENDENT_AMBULATORY_CARE_PROVIDER_SITE_OTHER): Payer: Medicare Other

## 2020-05-18 DIAGNOSIS — R300 Dysuria: Secondary | ICD-10-CM | POA: Diagnosis not present

## 2020-05-18 DIAGNOSIS — M85852 Other specified disorders of bone density and structure, left thigh: Secondary | ICD-10-CM

## 2020-05-18 MED ORDER — HYDROCODONE-ACETAMINOPHEN 10-325 MG PO TABS: 1.0000 | ORAL_TABLET | Freq: Four times a day (QID) | ORAL | 0 refills | Status: DC | PRN

## 2020-05-18 MED ORDER — DENOSUMAB 60 MG/ML ~~LOC~~ SOSY
60.0000 mg | PREFILLED_SYRINGE | Freq: Once | SUBCUTANEOUS | Status: AC
  Administered 2020-05-18: 60 mg via SUBCUTANEOUS

## 2020-05-18 NOTE — Progress Notes (Signed)
Patient presented for 6-month Prolia injection SQ to right arm. Patient tolerated well. 

## 2020-05-18 NOTE — Addendum Note (Signed)
Addended by: Adair Laundry on: 05/18/2020 09:09 AM   Modules accepted: Orders

## 2020-05-18 NOTE — Telephone Encounter (Signed)
Spoke with pt and she has been scheduled for a lab appt this afternoon to have her urine rechecked and scheduled for an ED follow up next Wednesday.   Pt also stated that the orthopedic doctor that she saw last week injected both of her hips but that it really has not helped much. Pt was wanting to know if she could get a refill on the hydrocodone until she could get in to see the orthopedic again on Monday.

## 2020-05-18 NOTE — Telephone Encounter (Signed)
Yes I have refilled the hydrocodone

## 2020-05-18 NOTE — Addendum Note (Signed)
Addended by: Crecencio Mc on: 05/18/2020 01:05 PM   Modules accepted: Orders

## 2020-05-18 NOTE — Telephone Encounter (Signed)
LMTCB

## 2020-05-19 LAB — URINALYSIS, ROUTINE W REFLEX MICROSCOPIC
Bilirubin Urine: NEGATIVE
Hgb urine dipstick: NEGATIVE
Ketones, ur: NEGATIVE
Leukocytes,Ua: NEGATIVE
Specific Gravity, Urine: 1.01 (ref 1.000–1.030)
Total Protein, Urine: NEGATIVE
Urine Glucose: NEGATIVE
Urobilinogen, UA: 0.2 (ref 0.0–1.0)
pH: 7 (ref 5.0–8.0)

## 2020-05-19 LAB — URINE CULTURE
MICRO NUMBER:: 10786840
Result:: NO GROWTH
SPECIMEN QUALITY:: ADEQUATE

## 2020-05-19 NOTE — Telephone Encounter (Signed)
noted 

## 2020-05-19 NOTE — Telephone Encounter (Signed)
Pt returned your call and said if you were calling her back about the medication she got it and thank you. She doesn't need a call back. She will see you next Weds.

## 2020-05-25 ENCOUNTER — Encounter: Payer: Self-pay | Admitting: Internal Medicine

## 2020-05-25 ENCOUNTER — Ambulatory Visit (INDEPENDENT_AMBULATORY_CARE_PROVIDER_SITE_OTHER): Payer: Medicare Other

## 2020-05-25 ENCOUNTER — Other Ambulatory Visit: Payer: Self-pay

## 2020-05-25 ENCOUNTER — Ambulatory Visit: Payer: Medicare Other | Admitting: Internal Medicine

## 2020-05-25 VITALS — BP 128/68 | HR 73 | Temp 98.5°F | Ht 62.99 in | Wt 224.2 lb

## 2020-05-25 DIAGNOSIS — I700 Atherosclerosis of aorta: Secondary | ICD-10-CM | POA: Diagnosis not present

## 2020-05-25 DIAGNOSIS — M48061 Spinal stenosis, lumbar region without neurogenic claudication: Secondary | ICD-10-CM

## 2020-05-25 DIAGNOSIS — E7800 Pure hypercholesterolemia, unspecified: Secondary | ICD-10-CM

## 2020-05-25 DIAGNOSIS — R198 Other specified symptoms and signs involving the digestive system and abdomen: Secondary | ICD-10-CM

## 2020-05-25 DIAGNOSIS — K8020 Calculus of gallbladder without cholecystitis without obstruction: Secondary | ICD-10-CM | POA: Diagnosis not present

## 2020-05-25 DIAGNOSIS — N3091 Cystitis, unspecified with hematuria: Secondary | ICD-10-CM | POA: Insufficient documentation

## 2020-05-25 DIAGNOSIS — T402X5A Adverse effect of other opioids, initial encounter: Secondary | ICD-10-CM | POA: Insufficient documentation

## 2020-05-25 DIAGNOSIS — I2584 Coronary atherosclerosis due to calcified coronary lesion: Secondary | ICD-10-CM

## 2020-05-25 DIAGNOSIS — I2510 Atherosclerotic heart disease of native coronary artery without angina pectoris: Secondary | ICD-10-CM | POA: Diagnosis not present

## 2020-05-25 DIAGNOSIS — K5903 Drug induced constipation: Secondary | ICD-10-CM

## 2020-05-25 HISTORY — DX: Cystitis, unspecified with hematuria: N30.91

## 2020-05-25 MED ORDER — ATORVASTATIN CALCIUM 40 MG PO TABS: 40.0000 mg | ORAL_TABLET | Freq: Every day | ORAL | 1 refills | Status: DC

## 2020-05-25 MED ORDER — LACTULOSE 20 GM/30ML PO SOLN: ORAL | 3 refills | Status: DC

## 2020-05-25 MED ORDER — HYDROCODONE-ACETAMINOPHEN 10-325 MG PO TABS: 1.0000 | ORAL_TABLET | Freq: Four times a day (QID) | ORAL | 0 refills | Status: DC | PRN

## 2020-05-25 NOTE — Patient Instructions (Addendum)
Stop your current laxative  Take 2 dulcolax (stimulant laxative "bisacodyl ",  Not the stool softener docusate) and start drinking Lactulose    30 ccs every 4 hours until you have a large BM  I will refill the hydrocodone for 30  Days .     I have increased your atorvastatin to 40 mg daily.  We will repeat your labs in 6 weeks    I'm not sure what caused by your UTI.  Severe constipation may have caused the bladder to not empty often enough.    Taking an antibiotic can create an imbalance in the normal population of bacteria that live in the small intestine.  This imbalance can persist for 3 months.   Taking a probiotic ( Align, Floraque or Culturelle), the generic version of one of these over the counter medications, or an alternative form (kombucha,  Yogurt, or another dietary source) for a minimum of 3 weeks may help prevent a serious antibiotic associated diarrhea  Called clostridium dificile colitis that occurs when the bacteria population is altered .  Taking a probiotic may also prevent vaginitis due to yeast infections and can be continued indefinitely if you feel that it improves your digestion or your elimination (bowels).

## 2020-05-25 NOTE — Progress Notes (Signed)
Subjective:  Patient ID: Carol [name], female    DOB: 1945-11-27  Age: 74 y.o. MRN: [id number]  CC: The primary encounter diagnosis was Alternating constipation and diarrhea. Diagnoses of Coronary atherosclerosis due to calcified coronary lesion, Abdominal aortic atherosclerosis ([place]), Asymptomatic gallstones, Pure hypercholesterolemia, Spinal stenosis at L4-L5 level, Constipation due to opioid therapy, and Hemorrhagic cystitis were also pertinent to this visit.  HPI Carol [name] presents for FOLLOW UP O MULTIPLE ISSUES .  LAST SEEN June 2021  This visit occurred during the SARS-CoV-2 public health emergency.  Safety protocols were in place, including screening questions prior to the visit, additional usage of staff PPE, and extensive cleaning of exam room while observing appropriate contact time as indicated for disinfecting solutions.    Treated by ER on August 1 for hemorrhagic cystitis. CT abd pelvis negative for renal and bladder masses.  No hydronephrosis. Treated for UTI with Keflex 500 mg tid x 7 days  And AZO and a probiotic.  Symptoms resolved . She has chronic constipation managed with an herbal laxative , but has been having more difficulty since starting opioids for pain management of spinal stenosis   CAD and aortic atherosclerosis was noted and discussed today.  She is already taking atorvastatin 20  And asa 81 mg daily  Lab Results  Component Value Date   CHOL 127 12/25/2019   HDL 46.30 12/25/2019   LDLCALC 56 12/25/2019   TRIG 123.0 12/25/2019   CHOLHDL 3 12/25/2019    MRI Lumbar spine done by Orthopedics Dr Alfonso Ramus after steroid injection in both hips was not helpful  She was offered  ESI in lumbar spine but she is in constant pain ,  Now using a walker to ambulate, and her right leg is getting weak..  BMs have become painful and liquid since she started taking vicodin.  She has been taking an herbal laxative  For chronic constipation and now taking an  additional 2 doses along with  MOM   Outpatient Medications Prior to Visit  Medication Sig Dispense Refill  . Ascorbic Acid (VITAMIN C) 1000 MG tablet Take 1,000 mg by mouth daily.    Marland Kitchen aspirin 81 MG tablet Take 81 mg by mouth daily.     . cephALEXin (KEFLEX) 500 MG capsule Take 1 capsule (500 mg total) by mouth 3 (three) times daily. 21 capsule 0  . cetirizine (ZYRTEC) 10 MG tablet Take 10 mg by mouth every morning.     . Cholecalciferol (VITAMIN D3) 2000 UNITS capsule Take 2,000 Units by mouth daily.      . Coenzyme Q10 (COQ-10) 200 MG CAPS Take 200 mg by mouth daily.    . fluticasone (FLONASE) 50 MCG/ACT nasal spray Place 2 sprays into both nostrils daily. (Patient taking differently: Place 2 sprays into both nostrils daily as needed for allergies. ) 16 g 6  . furosemide (LASIX) 20 MG tablet Take 1 tablet (20 mg total) by mouth 2 (two) times daily. 180 tablet 3  . Glucosamine Sulfate 1000 MG CAPS Take 1,000 mg by mouth daily.     Marland Kitchen letrozole (FEMARA) 2.5 MG tablet Take 1 tablet (2.5 mg total) by mouth daily. 90 tablet 3  . levothyroxine (SYNTHROID) 50 MCG tablet TAKE 1 TABLET EVERY DAY ON EMPTY STOMACHWITH A GLASS OF WATER AT LEAST 30-60 MINBEFORE BREAKFAST 90 tablet 0  . Misc Natural Products (OSTEO BI-FLEX JOINT SHIELD) TABS Take 2 tablets by mouth daily. With magnesium     . Multiple Vitamin (  MULTIVITAMIN) tablet Take 1 tablet by mouth daily.    . NEOMYCIN-POLYMYXIN-HYDROCORTISONE (CORTISPORIN) 1 % SOLN OTIC solution Apply 1-2 drops to toe BID after soaking 10 mL 1  . Olopatadine HCl (PATADAY OP) Place 1 drop into both eyes daily as needed (allergies).    . phenazopyridine (PYRIDIUM) 200 MG tablet Take 1 tablet (200 mg total) by mouth 3 (three) times daily as needed for pain. 6 tablet 0  . Polyvinyl Alcohol-Povidone (REFRESH OP) Place 1 drop into both eyes daily as needed (dry eyes).    Marland Kitchen rOPINIRole (REQUIP) 0.5 MG tablet Take 1 tablet (0.5 mg total) by mouth at bedtime. 90 tablet 3  .  senna (SENOKOT) 8.6 MG tablet Take 1-6 tablets by mouth at bedtime.     Marland Kitchen atorvastatin (LIPITOR) 20 MG tablet Take 1 tablet (20 mg total) by mouth daily. 90 tablet 3  . cefdinir (OMNICEF) 300 MG capsule Take 1 capsule (300 mg total) by mouth 2 (two) times daily. 14 capsule 0  . HYDROcodone-acetaminophen (NORCO) 10-325 MG tablet Take 1 tablet by mouth every 6 (six) hours as needed. 30 tablet 0   No facility-administered medications prior to visit.    Review of Systems;  Patient denies headache, fevers, malaise, unintentional weight loss, skin rash, eye pain, sinus congestion and sinus pain, sore throat, dysphagia,  hemoptysis , cough, dyspnea, wheezing, chest pain, palpitations, orthopnea, edema, abdominal pain, nausea, melena, diarrhea, constipation, flank pain, dysuria, hematuria, urinary  Frequency, nocturia, numbness, tingling, seizures,  Focal weakness, Loss of consciousness,  Tremor, insomnia, depression, anxiety, and suicidal ideation.      Objective:  BP 128/68 (BP Location: Left Arm, Patient Position: Sitting)   Pulse 73   Temp 98.5 F (36.9 C)   Ht 5' 2.99" (1.6 m)   Wt 224 lb 3.2 oz (101.7 kg)   SpO2 97%   BMI 39.73 kg/m   BP Readings from Last 3 Encounters:  05/25/20 128/68  05/15/20 127/82  03/31/20 130/76    Wt Readings from Last 3 Encounters:  05/25/20 224 lb 3.2 oz (101.7 kg)  05/15/20 (!) 233 lb (105.7 kg)  03/31/20 229 lb 6.4 oz (104.1 kg)    General appearance: alert, cooperative and appears to be in pain with movement.   Ears: normal TM's and external ear canals both ears Throat: lips, mucosa, and tongue normal; teeth and gums normal Neck: no adenopathy, no carotid bruit, supple, symmetrical, trachea midline and thyroid not enlarged, symmetric, no tenderness/mass/nodules Back: symmetric, no curvature. ROM normal. No CVA tenderness. Lungs: clear to auscultation bilaterally Heart: regular rate and rhythm, S1, S2 normal, no murmur, click, rub or  gallop Abdomen: soft, distended, non-tender; bowel sounds hypoactive ; no masses,  no organomegaly Pulses: 2+ and symmetric Skin: Skin color, texture, turgor normal. No rashes or lesions Lymph nodes: Cervical, supraclavicular, and axillary nodes normal.  Lab Results  Component Value Date   HGBA1C 6.7 (A) 03/31/2020   HGBA1C 6.6 (H) 12/25/2019   HGBA1C 6.4 10/03/2018    Lab Results  Component Value Date   CREATININE 1.03 (H) 05/15/2020   CREATININE 0.88 12/25/2019   CREATININE 0.91 10/03/2018    Lab Results  Component Value Date   WBC 11.5 (H) 05/15/2020   HGB 12.8 05/15/2020   HCT 36.2 05/15/2020   PLT 180 05/15/2020   GLUCOSE 181 (H) 05/15/2020   CHOL 127 12/25/2019   TRIG 123.0 12/25/2019   HDL 46.30 12/25/2019   LDLCALC 56 12/25/2019   ALT 25 12/25/2019  AST 22 12/25/2019   NA 142 05/15/2020   K 3.2 (L) 05/15/2020   CL 100 05/15/2020   CREATININE 1.03 (H) 05/15/2020   BUN 10 05/15/2020   CO2 32 05/15/2020   TSH 2.08 12/25/2019   HGBA1C 6.7 (A) 03/31/2020   MICROALBUR <0.7 04/07/2020    CT Renal Stone Study  Result Date: 05/15/2020 CLINICAL DATA:  Hematuria and bladder pressure. EXAM: CT ABDOMEN AND PELVIS WITHOUT CONTRAST TECHNIQUE: Multidetector CT imaging of the abdomen and pelvis was performed following the standard protocol without IV contrast. COMPARISON:  None. FINDINGS: Lower chest: Mild elevation of left hemidiaphragm. Lung bases are clear. There are coronary artery calcifications. Normal heart size. Hepatobiliary: No evidence of focal hepatic abnormality on noncontrast exam. Multiple calcified gallstones. No pericholecystic inflammation. No biliary dilatation. Pancreas: No ductal dilatation or inflammation. Spleen: Normal in size without focal abnormality. Adrenals/Urinary Tract: Normal adrenal glands. No hydronephrosis. No renal or ureteral calculi. No significant perinephric edema. There is no evidence of renal mass on noncontrast exam. Ureters are  decompressed. There is mild bladder wall thickening. Mild perivesicular fat stranding. No bladder stone. No obvious bladder mass. Stomach/Bowel: Tiny hiatal hernia. Stomach distended with ingested contents. Normal positioning of the duodenum and ligament of Treitz. No small bowel obstruction or inflammation. Cecum is high-riding in the right upper quadrant. Colon courses lateral to the liver. Appendix is not definitively visualized. There is no evidence of appendicitis. Colonic diverticulosis in the distal descending and sigmoid colon. No acute diverticulitis. Vascular/Lymphatic: Moderate aortic atherosclerosis. No aortic aneurysm. No enlarged abdominal or pelvic lymph nodes. Reproductive: Questionable small posterior uterine fibroid. No adnexal mass. Other: Small fat containing umbilical hernia. There is no free air. No free fluid. Musculoskeletal: Grade 1 anterolisthesis of L4 on L5. Mild scoliotic curvature of spine. There are no acute or suspicious osseous abnormalities. IMPRESSION: 1. Mild bladder wall thickening and perivesicular fat stranding, suspicious for cystitis. 2. No renal stones or obstructive uropathy. 3. Cholelithiasis without gallbladder inflammation. 4. Colonic diverticulosis without acute inflammation. Aortic Atherosclerosis (ICD10-I70.0). Electronically Signed   By: Keith Rake M.D.   On: 05/15/2020 20:58    Assessment & Plan:   Problem List Items Addressed This Visit      Unprioritized   Pure hypercholesterolemia   Relevant Medications   atorvastatin (LIPITOR) 40 MG tablet   Abdominal aortic atherosclerosis ([place])    Discussed CT with patient today.  Increase atorvastatin to 40 mg daily       Relevant Medications   atorvastatin (LIPITOR) 40 MG tablet   Asymptomatic gallstones    Noted on CT of abdomen august 1 .  Reassured that no surgery is indicated for asymptomatic gallstones.       Constipation due to opioid therapy    Acute on chronic due to use of hydrocodone.   Dulcolax 10 mg qhs and lactulose prn . Plain films ordered.       Coronary atherosclerosis due to calcified coronary lesion    Discussed CT findings with patient and husband.  She has agreed to increase atorvastatin to 40 mg daily.       Relevant Medications   atorvastatin (LIPITOR) 40 MG tablet   Hemorrhagic cystitis    Treated by ER physician on [place] 1 with keflex with resolution of all symptoms . Culture from Aug 1 grew E Coli sensitive to cephalosporins.  Repeat culture on Aug 4 was negative       Spinal stenosis at L4-L5 level    Severe,  With  milder stenosis at higher levels.  She is in constant pain requiring use of opioids 24/.  Using a walker, and having right leg weakness.  Agree with neurosurgical referral made by Orthopedics       Other Visit Diagnoses    Alternating constipation and diarrhea    -  Primary   Relevant Orders   DG Abd 1 View      I have discontinued Alexyss H. Cubero's cefdinir. I have also changed her atorvastatin. Additionally, I am having her start on Lactulose. Lastly, I am having her maintain her Glucosamine Sulfate, Osteo Bi-Flex Joint Shield, Vitamin D3, multivitamin, fluticasone, aspirin, cetirizine, vitamin C, CoQ-10, senna, Olopatadine HCl (PATADAY OP), Polyvinyl Alcohol-Povidone (REFRESH OP), letrozole, rOPINIRole, furosemide, NEOMYCIN-POLYMYXIN-HYDROCORTISONE, levothyroxine, cephALEXin, phenazopyridine, and HYDROcodone-acetaminophen.  Meds ordered this encounter  Medications  . Lactulose 20 GM/30ML SOLN    Sig: 30 ml every 4 hours until constipation is relieved    Dispense:  236 mL    Refill:  3  . atorvastatin (LIPITOR) 40 MG tablet    Sig: Take 1 tablet (40 mg total) by mouth daily.    Dispense:  90 tablet    Refill:  1    KEEP ON FILE FOR FUTURE REFILLS  . HYDROcodone-acetaminophen (NORCO) 10-325 MG tablet    Sig: Take 1 tablet by mouth every 6 (six) hours as needed.    Dispense:  120 tablet    Refill:  0   A total of 40 minutes  was spent with patient more than half of which was spent in counseling patient on the above mentioned issues , reviewing and explaining recent labs and imaging studies done, and coordination of care. Medications Discontinued During This Encounter  Medication Reason  . cefdinir (OMNICEF) 300 MG capsule   . atorvastatin (LIPITOR) 20 MG tablet   . HYDROcodone-acetaminophen (NORCO) 10-325 MG tablet     Follow-up: No follow-ups on file.   Crecencio Mc, MD

## 2020-05-25 NOTE — Assessment & Plan Note (Addendum)
Treated by ER physician on [place] 1 with keflex with resolution of all symptoms . Culture from Aug 1 grew E Coli sensitive to cephalosporins.  Repeat culture on Aug 4 was negative

## 2020-05-25 NOTE — Assessment & Plan Note (Addendum)
Noted on CT of abdomen august 1 .  Reassured that no surgery is indicated for asymptomatic gallstones.

## 2020-05-25 NOTE — Assessment & Plan Note (Signed)
Acute on chronic due to use of hydrocodone.  Dulcolax 10 mg qhs and lactulose prn . Plain films ordered.

## 2020-05-25 NOTE — Assessment & Plan Note (Signed)
Discussed CT findings with patient and husband.  She has agreed to increase atorvastatin to 40 mg daily.

## 2020-05-25 NOTE — Assessment & Plan Note (Signed)
Severe,  With milder stenosis at higher levels.  She is in constant pain requiring use of opioids 24/.  Using a walker, and having right leg weakness.  Agree with neurosurgical referral made by Orthopedics

## 2020-05-25 NOTE — Assessment & Plan Note (Signed)
Discussed CT with patient today.  Increase atorvastatin to 40 mg daily

## 2020-05-28 ENCOUNTER — Other Ambulatory Visit: Payer: Self-pay

## 2020-05-28 ENCOUNTER — Ambulatory Visit
Admission: EM | Admit: 2020-05-28 | Discharge: 2020-05-28 | Disposition: A | Discharge status: Home / Self Care | Payer: Medicare Other | Attending: Emergency Medicine | Admitting: Emergency Medicine

## 2020-05-28 DIAGNOSIS — N390 Urinary tract infection, site not specified: Secondary | ICD-10-CM

## 2020-05-28 DIAGNOSIS — N3001 Acute cystitis with hematuria: Secondary | ICD-10-CM | POA: Diagnosis present

## 2020-05-28 LAB — URINALYSIS, COMPLETE (UACMP) WITH MICROSCOPIC
Bilirubin Urine: NEGATIVE
Glucose, UA: NEGATIVE mg/dL
Ketones, ur: NEGATIVE mg/dL
Nitrite: NEGATIVE
Protein, ur: 100 mg/dL — AB
RBC / HPF: >50 RBC/hpf (ref 0–5)
Specific Gravity, Urine: 1.015 (ref 1.005–1.030)
WBC, UA: >50 WBC/hpf (ref 0–5)
pH: 6.5 (ref 5.0–8.0)

## 2020-05-28 MED ORDER — CEPHALEXIN 500 MG PO CAPS
500.0000 mg | ORAL_CAPSULE | Freq: Two times a day (BID) | ORAL | 0 refills | Status: DC
Start: 2020-05-28 — End: 2020-07-25

## 2020-05-28 NOTE — ED Provider Notes (Addendum)
[facility]    CSN: [id number] Arrival date & time: 05/28/20  1130      History   Chief Complaint Chief Complaint  Patient presents with  . Urinary Tract Infection    HPI Carol [name] is a 74 y.o. female.   HPI  74 year old female presents with symptoms of UTI that started today.  She also has blood in her urine which prompted the visit.  I have reviewed her medical records in detail.  She was seen in the emergency room on 8/ 1 with  UTI cultures grew out E. coli and she was treated appropriately with Keflex TID for 7 days.  Follow-up visit on 05/18/2020 her primary care physician a normal urine  This morning  she awoke with pressure prepubic area hematuria frequency and urgency with insatiety.  Her husband is already picked up Azo for her.  She denies any fever chills has had no nausea or vomiting and denies any vaginal discharge.  She states that she does not wipe herself adequately due to her back pain.     Past Medical History:  Diagnosis Date  . Allergic rhinitis, cause unspecified   . Arthritis    hands  . Breast cancer ([place]) 2019   8 mm ER/PR positive, HER-2/neu not overexpressed. Right breast- Oncotype score 14 (4% chance of recurrence on antiestrogen alone)  . Bronchitis    h/o  . Cataract   . Fibromyalgia   . HLD (hyperlipidemia)   . Hypothyroidism   . 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 Other abnormal glucose   . 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 Personal history of colonic polyps   . Personal history of radiation therapy 2019   f/u right breast cancer  . Pre-diabetes   . Presence of dental prosthetic device    implants - top and bottom  . Shingles   . Swelling of limb   . Thyroid goiter   . Tobacco use disorder   . Unspecified sinusitis (chronic)     Patient Active Problem List   Diagnosis Date Noted  . Coronary atherosclerosis due to calcified coronary lesion 05/25/2020  .  Abdominal aortic atherosclerosis ([place]) 05/25/2020  . Asymptomatic gallstones 05/25/2020  . Spinal stenosis at L4-L5 level 05/25/2020  . Constipation due to opioid therapy 05/25/2020  . Hemorrhagic cystitis 05/25/2020  . Acute paronychia of toe, left 04/03/2020  . Abnormal mammogram of right breast 12/29/2019  . Type 2 diabetes mellitus without complication, without long-term current use of insulin ([place]) 12/28/2019  . At high risk for fracture 11/20/2018  . Tubular adenoma of colon 10/04/2018  . Insomnia due to anxiety and fear 02/22/2018  . Osteopenia 02/13/2018  . Malignant neoplasm of upper-outer quadrant of right female breast ([place]) 12/04/2017  . Hypothyroidism (acquired) 10/01/2017  . Post herpetic neuralgia 09/30/2017  . History of colonic polyps 03/06/2017  . Obesity 01/17/2016  . Allergic rhinitis due to pollen 06/15/2015  . Restless leg syndrome 06/15/2015  . Fibromyalgia 05/06/2015  . Encounter for general adult medical examination with abnormal findings 10/06/2012  . Pure hypercholesterolemia 10/06/2012  . Venous stasis 10/06/2012  . Hyperlipidemia 09/05/2010  . DYSPNEA 09/05/2010    Past Surgical History:  Procedure Laterality Date  . APPENDECTOMY  06/1997  . BREAST BIOPSY Right 11/05/2000    core with clip/neg  . BREAST BIOPSY Right 2011   benign, clip placed   . BREAST BIOPSY Right 12/02/2017   [facility] and [facility]  .  CATARACT EXTRACTION W/PHACO Left 07/30/2018   Procedure: CATARACT EXTRACTION PHACO AND INTRAOCULAR LENS PLACEMENT ([place]) LEFT;  Surgeon: Leandrew Koyanagi, MD;  Location: [place];  Service: Ophthalmology;  Laterality: Left;  . CATARACT EXTRACTION W/PHACO Right 08/27/2018   Procedure: CATARACT EXTRACTION PHACO AND INTRAOCULAR LENS PLACEMENT ([place])  RIGHT;  Surgeon: Leandrew Koyanagi, MD;  Location: [place];  Service: Ophthalmology;  Laterality: Right;  . COLONOSCOPY  10/15/2012   mutliple polyps.  Outlaw in [place].  Repeat 5  years.  . COLONOSCOPY W/ BIOPSIES  03/30/2004   Adenomatous polyp of the cecum, hyperplastic polyps of the splenic flexure, upper rectum and mid rectum.  . COLONOSCOPY WITH PROPOFOL N/A 03/19/2018   Tubular adenoma x2 without atypia.  Follow-up 2024.  Surgeon: Robert Bellow, MD;  Location: [facility];  Service: Endoscopy;  Laterality: N/A;  . COLPOSCOPY  10/16/2011   HGSIL pap smear.  Negative.  Marland Kitchen EXTERNAL EAR SURGERY  09/2008   external - not specified  (RIGHT)  . MASTECTOMY, PARTIAL Right 12/23/2017   T1b, N0; 8 mm ER: 100%; PR: 100%; Her 2 neu not overexpressed.  MammoSite placement.  Surgeon: Robert Bellow, MD;  Location: [facility];  Service: General;  Laterality: Right;  . SENTINEL NODE BIOPSY Right 12/23/2017   Procedure: SENTINEL NODE BIOPSY;  Surgeon: Robert Bellow, MD;  Location: [facility];  Service: General;  Laterality: Right;    OB History    Gravida  2   Para      Term      Preterm      AB      Living        SAB      TAB      Ectopic      Multiple      Live Births  2        Obstetric Comments  1st Menstrual Cycle:  13 1st Pregnancy: 21          Home Medications    Prior to Admission medications   Medication Sig Start Date End Date Taking? Authorizing Provider  Ascorbic Acid (VITAMIN C) 1000 MG tablet Take 1,000 mg by mouth daily.   Yes [provider]  aspirin 81 MG tablet Take 81 mg by mouth daily.    Yes [provider]  atorvastatin (LIPITOR) 40 MG tablet Take 1 tablet (40 mg total) by mouth daily. 05/25/20  Yes Crecencio Mc, MD  cetirizine (ZYRTEC) 10 MG tablet Take 10 mg by mouth every morning.    Yes [provider]  Cholecalciferol (VITAMIN D3) 2000 UNITS capsule Take 2,000 Units by mouth daily.     Yes [provider]  Coenzyme Q10 (COQ-10) 200 MG CAPS Take 200 mg by mouth daily.   Yes [provider]  fluticasone (FLONASE) 50 MCG/ACT nasal spray Place 2 sprays into both nostrils  daily. Patient taking differently: Place 2 sprays into both nostrils daily as needed for allergies.  06/15/15  Yes [name], MD  furosemide (LASIX) 20 MG tablet Take 1 tablet (20 mg total) by mouth 2 (two) times daily. 10/29/19  Yes Crecencio Mc, MD  Glucosamine Sulfate 1000 MG CAPS Take 1,000 mg by mouth daily.    Yes [provider]  HYDROcodone-acetaminophen (NORCO) 10-325 MG tablet Take 1 tablet by mouth every 6 (six) hours as needed. 05/25/20  Yes Crecencio Mc, MD  Lactulose 20 GM/30ML SOLN 30 ml every 4 hours until constipation is relieved  05/25/20  Yes Crecencio Mc, MD  letrozole [facility]) 2.5 MG tablet Take 1 tablet (2.5 mg total) by mouth daily. 10/29/19  Yes Crecencio Mc, MD  levothyroxine (SYNTHROID) 50 MCG tablet TAKE 1 TABLET EVERY DAY ON EMPTY STOMACHWITH A GLASS OF WATER AT LEAST 30-60 MINBEFORE BREAKFAST 05/03/20  Yes Crecencio Mc, MD  Misc Natural Products (OSTEO BI-FLEX JOINT SHIELD) TABS Take 2 tablets by mouth daily. With magnesium    Yes [provider]  Multiple Vitamin (MULTIVITAMIN) tablet Take 1 tablet by mouth daily.   Yes [provider]  NEOMYCIN-POLYMYXIN-HYDROCORTISONE (CORTISPORIN) 1 % SOLN OTIC solution Apply 1-2 drops to toe BID after soaking 03/02/20  Yes Hyatt, Max T, DPM  Olopatadine HCl (PATADAY OP) Place 1 drop into both eyes daily as needed (allergies).   Yes [provider]  phenazopyridine (PYRIDIUM) 200 MG tablet Take 1 tablet (200 mg total) by mouth 3 (three) times daily as needed for pain. 05/15/20  Yes Paulette Blanch, MD  Polyvinyl Alcohol-Povidone (REFRESH OP) Place 1 drop into both eyes daily as needed (dry eyes).   Yes [provider]  rOPINIRole (REQUIP) 0.5 MG tablet Take 1 tablet (0.5 mg total) by mouth at bedtime. 10/29/19  Yes Crecencio Mc, MD  senna (SENOKOT) 8.6 MG tablet Take 1-6 tablets by mouth at bedtime.    Yes [provider]  cephALEXin (KEFLEX) 500 MG capsule Take 1 capsule  (500 mg total) by mouth 2 (two) times daily. 05/28/20   Lorin Picket, PA-C    Family History Family History  Problem Relation Age of Onset  . Heart failure Father   . Hypertension Father   . Stroke Father   . Hyperlipidemia Father   . Heart failure Brother   . Diabetes Brother   . Heart disease Brother   . Diabetes Brother   . Heart disease Brother   . Hypertension Sister   . Depression Sister   . Hypertension Sister   . Asthma Sister   . Hyperlipidemia Sister   . Arthritis Other   . Coronary artery disease Other   . Hyperlipidemia Brother   . Hypertension Brother   . Breast cancer Maternal Aunt     Social History Social History   Tobacco Use  . Smoking status: Former Smoker    Packs/day: 1.00    Years: 40.00    Pack years: 40.00    Types: Cigarettes    Quit date: 04/02/2003    Years since quitting: 17.1  . Smokeless tobacco: Never Used  . Tobacco comment: 7-10 cigarettes for past 40 years   Vaping Use  . Vaping Use: Every day  Substance Use Topics  . Alcohol use: Yes    Comment: rarely  . Drug use: No     Allergies   Patient has no known allergies.   Review of Systems Review of Systems  Constitutional: Positive for activity change. Negative for appetite change, chills, fatigue and fever.  Genitourinary: Positive for decreased urine volume, dysuria, frequency, hematuria and urgency. Negative for vaginal discharge.  All other systems reviewed and are negative.    Physical Exam Triage Vital Signs ED Triage Vitals  Enc Vitals Group     BP 05/28/20 1151 136/60     Pulse Rate 05/28/20 1151 84     Resp 05/28/20 1151 18     Temp 05/28/20 1151 98 F (36.7 C)     Temp Source 05/28/20 1151 Oral     SpO2 05/28/20  1151 97 %     Weight 05/28/20 1155 228 lb (103.4 kg)     Height 05/28/20 1155 '5\' 3"'$  (1.6 m)     Head Circumference --      Peak Flow --      Pain Score 05/28/20 1154 3     Pain Loc --      Pain Edu? --      Excl. in [place]? --    No data  found.  Updated Vital Signs BP 136/60 (BP Location: Left Arm)   Pulse 84   Temp 98 F (36.7 C) (Oral)   Resp 18   Ht '5\' 3"'$  (1.6 m)   Wt 228 lb (103.4 kg)   SpO2 97%   BMI 40.39 kg/m   Visual Acuity Right Eye Distance:   Left Eye Distance:   Bilateral Distance:    Right Eye Near:   Left Eye Near:    Bilateral Near:     Physical Exam Vitals and nursing note reviewed.  Constitutional:      General: She is not in acute distress.    Appearance: Normal appearance. She is obese. She is not ill-appearing or toxic-appearing.  HENT:     Head: Normocephalic and atraumatic.  Eyes:     Conjunctiva/sclera: Conjunctivae normal.  Pulmonary:     Effort: Pulmonary effort is normal.     Breath sounds: Normal breath sounds.  Abdominal:     Tenderness: There is no right CVA tenderness or left CVA tenderness.  Musculoskeletal:        General: Normal range of motion.     Cervical back: Normal range of motion and neck supple.  Skin:    General: Skin is warm and dry.  Neurological:     General: No focal deficit present.     Mental Status: She is alert and oriented to person, place, and time.  Psychiatric:        Mood and Affect: Mood normal.        Behavior: Behavior normal.        Thought Content: Thought content normal.        Judgment: Judgment normal.      UC Treatments / Results  Labs (all labs ordered are listed, but only abnormal results are displayed) Labs Reviewed  URINALYSIS, COMPLETE (UACMP) WITH MICROSCOPIC - Abnormal; Notable for the following components:      Result Value   Color, Urine AMBER (*)    APPearance HAZY (*)    Hgb urine dipstick LARGE (*)    Protein, ur 100 (*)    Leukocytes,Ua LARGE (*)    Bacteria, UA MANY (*)    All other components within normal limits  URINE CULTURE    EKG   Radiology No results found.  Procedures Procedures (including critical care time)  Medications Ordered in UC Medications - No data to display  Initial  Impression / Assessment and Plan / UC Course  I have reviewed the triage vital signs and the nursing notes.  Pertinent labs & imaging results that were available during my care of the patient were reviewed by me and considered in my medical decision making (see chart for details).   74 year old female presents with symptoms of UTI.  Review of her medical records revealed that she was treated for UTI on 05/15/20 which grew E. coli and she was treated appropriately with Keflex 500 mg 3 times daily for 7 days.  Repeat urine at her primary care office on 8 4  showed no signs of immune UTI.  Unfortunately this morning she awoke with the return of her symptoms including hematuria.  Urinalysis today showed large hemoglobin and leukocytes many bacteria RBCs greater than 50 and WBCs greater than 50 with white blood cell clumps.  I have cultured her urine again.  I will start her again on Keflex since Macrodantin MIC numbers were higher than the Keflex.  Prescribed Keflex 500 mg twice daily for 7 days.  Her husband has already picked up Azo for her dysuria.  Recommend she follow-up with her primary care in a week or sooner if she is not improving.   Final Clinical Impressions(s) / UC Diagnoses   Final diagnoses:  Lower urinary tract infectious disease  Hematuria due to acute cystitis   Discharge Instructions   None    ED Prescriptions    Medication Sig Dispense Auth. Provider   cephALEXin (KEFLEX) 500 MG capsule Take 1 capsule (500 mg total) by mouth 2 (two) times daily. 14 capsule Lorin Picket, PA-C     PDMP not reviewed this encounter.   Lorin Picket, PA-C 05/28/20 1240    Lorin Picket, PA-C 05/28/20 1242

## 2020-05-28 NOTE — ED Triage Notes (Signed)
Patient in today w/ c/o UTI. Patient states she has blood in her urine. Patient also states she was in the ER on 8/1 for same sx.

## 2020-05-31 LAB — URINE CULTURE
Culture: >=100000 — AB
Special Requests: NORMAL

## 2020-06-01 ENCOUNTER — Encounter: Payer: Self-pay | Admitting: Internal Medicine

## 2020-06-01 ENCOUNTER — Ambulatory Visit (INDEPENDENT_AMBULATORY_CARE_PROVIDER_SITE_OTHER): Payer: Medicare Other | Admitting: Internal Medicine

## 2020-06-01 ENCOUNTER — Ambulatory Visit (INDEPENDENT_AMBULATORY_CARE_PROVIDER_SITE_OTHER): Payer: Medicare Other

## 2020-06-01 ENCOUNTER — Other Ambulatory Visit: Payer: Self-pay

## 2020-06-01 VITALS — BP 122/68 | HR 105 | Temp 98.7°F | Resp 16 | Ht 63.0 in | Wt 219.0 lb

## 2020-06-01 DIAGNOSIS — K5903 Drug induced constipation: Secondary | ICD-10-CM

## 2020-06-01 DIAGNOSIS — N3001 Acute cystitis with hematuria: Secondary | ICD-10-CM

## 2020-06-01 DIAGNOSIS — M48061 Spinal stenosis, lumbar region without neurogenic claudication: Secondary | ICD-10-CM | POA: Diagnosis not present

## 2020-06-01 DIAGNOSIS — N3091 Cystitis, unspecified with hematuria: Secondary | ICD-10-CM | POA: Diagnosis not present

## 2020-06-01 DIAGNOSIS — T402X5A Adverse effect of other opioids, initial encounter: Secondary | ICD-10-CM

## 2020-06-01 NOTE — Patient Instructions (Signed)
Stop using the lactulose  Resume your senna tablet and double the dose if you do not have a bowel movement in 24 hours  Make sure you are taking a probiotic

## 2020-06-01 NOTE — Progress Notes (Signed)
Subjective:  Patient ID: Carol [name], female    DOB: 28-Oct-1945  Age: 74 y.o. MRN: [id number]  CC: The primary encounter diagnosis was Acute cystitis with hematuria. Diagnoses of Constipation due to opioid therapy, Hemorrhagic cystitis, and Spinal stenosis at L4-L5 level were also pertinent to this visit.  HPI Carol [name] presents for ER follow up for recurrent UTI  This visit occurred during the SARS-CoV-2 public health emergency.  Safety protocols were in place, including screening questions prior to the visit, additional usage of staff PPE, and extensive cleaning of exam room while observing appropriate contact time as indicated for disinfecting solutions.     She was treated on August 1 for E Coli UTI  Resistant to ampicillin and septra ,  sensitive to cephalosporins, with Keflex .  UTI resolved by 8/4 culture.  Symptoms recurred with suprapubic pressure and hematuria on August 14. She was prescribed Keflex again by ER physician for E Coli ,  Culture grew same organism with identical sensitivities.   The cause of the recurrence appears to be fecal contamination .  She has been been having liquid stools  up to 4-5 times daily due to continued use of lactulose which she has been taking every 4 hours since August 11 to resolve obstipation and ileus noted on plain films of abdomen.   Starting to feel nauseated every time she take the lactulose  Had a semi solid stool for the first time today.   She has a history of Chronic constipation since she was diagnosed with  fibromyalgia  Many years ago,  She has always used an herbal supplement containing  senna daily for the past 20 years.  However it was ineffective once she began taking  hydrocodone for severe back pain.  She has a   Neurosurgery appt with Dr Ronnald Ramp on Sept 2   Outpatient Medications Prior to Visit  Medication Sig Dispense Refill  . Ascorbic Acid (VITAMIN C) 1000 MG tablet Take 1,000 mg by mouth daily.    Marland Kitchen  aspirin 81 MG tablet Take 81 mg by mouth daily.     Marland Kitchen atorvastatin (LIPITOR) 40 MG tablet Take 1 tablet (40 mg total) by mouth daily. 90 tablet 1  . cephALEXin (KEFLEX) 500 MG capsule Take 1 capsule (500 mg total) by mouth 2 (two) times daily. 14 capsule 0  . cetirizine (ZYRTEC) 10 MG tablet Take 10 mg by mouth every morning.     . Cholecalciferol (VITAMIN D3) 2000 UNITS capsule Take 2,000 Units by mouth daily.      . Coenzyme Q10 (COQ-10) 200 MG CAPS Take 200 mg by mouth daily.    . fluticasone (FLONASE) 50 MCG/ACT nasal spray Place 2 sprays into both nostrils daily. (Patient taking differently: Place 2 sprays into both nostrils daily as needed for allergies. ) 16 g 6  . furosemide (LASIX) 20 MG tablet Take 1 tablet (20 mg total) by mouth 2 (two) times daily. 180 tablet 3  . Glucosamine Sulfate 1000 MG CAPS Take 1,000 mg by mouth daily.     Marland Kitchen HYDROcodone-acetaminophen (NORCO) 10-325 MG tablet Take 1 tablet by mouth every 6 (six) hours as needed. 120 tablet 0  . letrozole (FEMARA) 2.5 MG tablet Take 1 tablet (2.5 mg total) by mouth daily. 90 tablet 3  . levothyroxine (SYNTHROID) 50 MCG tablet TAKE 1 TABLET EVERY DAY ON EMPTY STOMACHWITH A GLASS OF WATER AT LEAST 30-60 MINBEFORE BREAKFAST 90 tablet 0  . Misc Natural Products (OSTEO BI-FLEX  JOINT SHIELD) TABS Take 2 tablets by mouth daily. With magnesium     . Multiple Vitamin (MULTIVITAMIN) tablet Take 1 tablet by mouth daily.    . NEOMYCIN-POLYMYXIN-HYDROCORTISONE (CORTISPORIN) 1 % SOLN OTIC solution Apply 1-2 drops to toe BID after soaking 10 mL 1  . Olopatadine HCl (PATADAY OP) Place 1 drop into both eyes daily as needed (allergies).    . phenazopyridine (PYRIDIUM) 200 MG tablet Take 1 tablet (200 mg total) by mouth 3 (three) times daily as needed for pain. 6 tablet 0  . Polyvinyl Alcohol-Povidone (REFRESH OP) Place 1 drop into both eyes daily as needed (dry eyes).    Marland Kitchen rOPINIRole (REQUIP) 0.5 MG tablet Take 1 tablet (0.5 mg total) by mouth at  bedtime. 90 tablet 3  . senna (SENOKOT) 8.6 MG tablet Take 1-6 tablets by mouth at bedtime.     . Lactulose 20 GM/30ML SOLN 30 ml every 4 hours until constipation is relieved 236 mL 3   No facility-administered medications prior to visit.    Review of Systems;  Patient denies headache, fevers, malaise, unintentional weight loss, skin rash, eye pain, sinus congestion and sinus pain, sore throat, dysphagia,  hemoptysis , cough, dyspnea, wheezing, chest pain, palpitations, orthopnea, edema, abdominal pain, , melena,, flank pain, dysuria, hematuria, urinary  Frequency, nocturia, numbness, tingling, seizures,  Focal weakness, Loss of consciousness,  Tremor, insomnia, depression, anxiety, and suicidal ideation.      Objective:  BP 122/68 (BP Location: Left Arm, Patient Position: Sitting, Cuff Size: Large)   Pulse (!) 105   Temp 98.7 F (37.1 C) (Oral)   Resp 16   Ht 5\' 3"  (1.6 m)   Wt 219 lb (99.3 kg)   SpO2 95%   BMI 38.79 kg/m   BP Readings from Last 3 Encounters:  06/01/20 122/68  05/28/20 136/60  05/25/20 128/68    Wt Readings from Last 3 Encounters:  06/01/20 219 lb (99.3 kg)  05/28/20 228 lb (103.4 kg)  05/25/20 224 lb 3.2 oz (101.7 kg)    General appearance: alert, cooperative and appears stated age.  Appears to be in pain with movement.  Ears: normal TM's and external ear canals both ears Throat: lips, mucosa, and tongue normal; teeth and gums normal Neck: no adenopathy, no carotid bruit, supple, symmetrical, trachea midline and thyroid not enlarged, symmetric, no tenderness/mass/nodules Back: symmetric, no curvature. ROM normal. No CVA tenderness. Lungs: clear to auscultation bilaterally Heart: regular rate and rhythm, S1, S2 normal, no murmur, click, rub or gallop Abdomen: soft, non-tender; bowel sounds hypoactive but not high pitched. ; no masses,  no organomegaly Pulses: 2+ and symmetric Skin: Skin color, texture, turgor normal. No rashes or lesions Lymph nodes:  Cervical, supraclavicular, and axillary nodes normal.  Lab Results  Component Value Date   HGBA1C 6.7 (A) 03/31/2020   HGBA1C 6.6 (H) 12/25/2019   HGBA1C 6.4 10/03/2018    Lab Results  Component Value Date   CREATININE 1.03 (H) 05/15/2020   CREATININE 0.88 12/25/2019   CREATININE 0.91 10/03/2018    Lab Results  Component Value Date   WBC 11.5 (H) 05/15/2020   HGB 12.8 05/15/2020   HCT 36.2 05/15/2020   PLT 180 05/15/2020   GLUCOSE 181 (H) 05/15/2020   CHOL 127 12/25/2019   TRIG 123.0 12/25/2019   HDL 46.30 12/25/2019   LDLCALC 56 12/25/2019   ALT 25 12/25/2019   AST 22 12/25/2019   NA 142 05/15/2020   K 3.2 (L) 05/15/2020   CL 100  05/15/2020   CREATININE 1.03 (H) 05/15/2020   BUN 10 05/15/2020   CO2 32 05/15/2020   TSH 2.08 12/25/2019   HGBA1C 6.7 (A) 03/31/2020   MICROALBUR <0.7 04/07/2020    No results found.  Assessment & Plan:   Problem List Items Addressed This Visit      Unprioritized   Constipation due to opioid therapy    Improved stool burden post lactulose therapy.  Resume prior laxative regimen and increase prn       Relevant Orders   DG Abd 1 View (Completed)   Hemorrhagic cystitis    Recurrent UTI attributed to fecal contamination of perineal area due to frequent loose stools.  Repeat UA drawn today is negative for WBC's. Urine culture is pending       Spinal stenosis at L4-L5 level    Severe,  With milder stenosis at higher levels.  She is in constant pain requiring use of opioids 24/7.  Using a walker, and having right leg weakness.  She has an appt with Dr Ronnald Ramp, neurosurgery on Sept 2        Other Visit Diagnoses    Acute cystitis with hematuria    -  Primary   Relevant Orders   Urinalysis, Routine w reflex microscopic (Completed)   Urine Culture      I provided  30 minutes of  face-to-face time during this encounter reviewing patient's current problems and past surgeries, labs and imaging studies, providing counseling on the  above mentioned problems , and coordination  of care . I am having Carol [name] maintain her Glucosamine Sulfate, Osteo Bi-Flex Joint Shield, Vitamin D3, multivitamin, fluticasone, aspirin, cetirizine, vitamin C, CoQ-10, senna, Olopatadine HCl (PATADAY OP), Polyvinyl Alcohol-Povidone (REFRESH OP), letrozole, rOPINIRole, furosemide, NEOMYCIN-POLYMYXIN-HYDROCORTISONE, levothyroxine, phenazopyridine, atorvastatin, HYDROcodone-acetaminophen, and cephALEXin.  No orders of the defined types were placed in this encounter.   There are no discontinued medications.  Follow-up: No follow-ups on file.   Crecencio Mc, MD

## 2020-06-02 ENCOUNTER — Other Ambulatory Visit: Payer: Self-pay | Admitting: Internal Medicine

## 2020-06-02 LAB — URINALYSIS, ROUTINE W REFLEX MICROSCOPIC
Bilirubin Urine: NEGATIVE
Hgb urine dipstick: NEGATIVE
Leukocytes,Ua: NEGATIVE
RBC / HPF: NONE SEEN (ref 0–?)
Specific Gravity, Urine: 1.01 (ref 1.000–1.030)
Total Protein, Urine: NEGATIVE
Urine Glucose: NEGATIVE
Urobilinogen, UA: 1 (ref 0.0–1.0)
pH: 6 (ref 5.0–8.0)

## 2020-06-02 LAB — URINE CULTURE
MICRO NUMBER:: 10842191
Result:: NO GROWTH
SPECIMEN QUALITY:: ADEQUATE

## 2020-06-02 NOTE — Assessment & Plan Note (Signed)
Severe,  With milder stenosis at higher levels.  She is in constant pain requiring use of opioids 24/7.  Using a walker, and having right leg weakness.  She has an appt with Dr Ronnald Ramp, neurosurgery on Sept 2

## 2020-06-02 NOTE — Assessment & Plan Note (Signed)
Improved stool burden post lactulose therapy.  Resume prior laxative regimen and increase prn

## 2020-06-02 NOTE — Assessment & Plan Note (Signed)
Recurrent UTI attributed to fecal contamination of perineal area due to frequent loose stools.  Repeat UA drawn today is negative for WBC's. Urine culture is pending

## 2020-06-03 ENCOUNTER — Telehealth: Payer: Self-pay | Admitting: Internal Medicine

## 2020-06-03 NOTE — Telephone Encounter (Signed)
Is it okay to fill one out?

## 2020-06-03 NOTE — Telephone Encounter (Signed)
Yes

## 2020-06-03 NOTE — Telephone Encounter (Signed)
Pt would like to request a handicap placard for parking due to chronic back pain. Please advise. Leave voicemail if no answer

## 2020-06-06 NOTE — Telephone Encounter (Signed)
Placed in quick sign folder for signature.  

## 2020-06-07 NOTE — Telephone Encounter (Signed)
Form has been signed. Pt is aware and stated that she would come pick up. Placed up front in accordion folder.

## 2020-06-17 ENCOUNTER — Other Ambulatory Visit: Payer: Self-pay | Admitting: Neurological Surgery

## 2020-06-21 ENCOUNTER — Other Ambulatory Visit: Payer: Self-pay | Admitting: General Surgery

## 2020-06-21 DIAGNOSIS — C50411 Malignant neoplasm of upper-outer quadrant of right female breast: Secondary | ICD-10-CM

## 2020-06-21 NOTE — Progress Notes (Signed)
Patient prefers a Tuesday appointment at 1:30 PM.

## 2020-06-28 ENCOUNTER — Telehealth: Payer: Self-pay | Admitting: Internal Medicine

## 2020-06-28 MED ORDER — HYDROCODONE-ACETAMINOPHEN 10-325 MG PO TABS
1.0000 | ORAL_TABLET | Freq: Four times a day (QID) | ORAL | 0 refills | Status: DC | PRN
Start: 2020-06-28 — End: 2020-07-26

## 2020-06-28 NOTE — Telephone Encounter (Signed)
Refill request for norco, last seen 06-01-20, last filled 05-25-20.  Please advise.

## 2020-06-28 NOTE — Telephone Encounter (Signed)
Pt would like a refill on HYDROcodone-acetaminophen (NORCO) 10-325 MG tablet. Pt states that she does not have enough to make it until next appt with PCP.

## 2020-07-04 ENCOUNTER — Telehealth: Payer: Self-pay | Admitting: Internal Medicine

## 2020-07-04 DIAGNOSIS — N3091 Cystitis, unspecified with hematuria: Secondary | ICD-10-CM

## 2020-07-04 NOTE — Telephone Encounter (Signed)
Patient has to submit another urine specimen to run for treatment before I will prescribe antibiotics  Labs ordered

## 2020-07-04 NOTE — Telephone Encounter (Signed)
Called the patient and scheduled for urine sample drop off on 07/05/20.

## 2020-07-04 NOTE — Telephone Encounter (Signed)
Pt states that she has cystitis again and would like medication called in. Please call with any questions.

## 2020-07-05 ENCOUNTER — Other Ambulatory Visit: Payer: Self-pay | Admitting: Internal Medicine

## 2020-07-05 ENCOUNTER — Other Ambulatory Visit (INDEPENDENT_AMBULATORY_CARE_PROVIDER_SITE_OTHER): Payer: Medicare Other

## 2020-07-05 ENCOUNTER — Other Ambulatory Visit: Payer: Self-pay

## 2020-07-05 DIAGNOSIS — N3091 Cystitis, unspecified with hematuria: Secondary | ICD-10-CM

## 2020-07-05 HISTORY — PX: MAXIMUM ACCESS (MAS)POSTERIOR LUMBAR INTERBODY FUSION (PLIF) 1 LEVEL: SHX6368

## 2020-07-05 HISTORY — PX: CARPAL TUNNEL RELEASE: SHX101

## 2020-07-05 LAB — URINALYSIS, ROUTINE W REFLEX MICROSCOPIC
Bilirubin Urine: NEGATIVE
Ketones, ur: NEGATIVE
Nitrite: POSITIVE — AB
Specific Gravity, Urine: <=1.005 — AB (ref 1.000–1.030)
Total Protein, Urine: NEGATIVE
Urine Glucose: NEGATIVE
Urobilinogen, UA: 1 (ref 0.0–1.0)
pH: 7 (ref 5.0–8.0)

## 2020-07-05 LAB — MICROALBUMIN / CREATININE URINE RATIO
Creatinine,U: 13.3 mg/dL
Microalb Creat Ratio: 9.4 mg/g (ref 0.0–30.0)
Microalb, Ur: 1.2 mg/dL (ref 0.0–1.9)

## 2020-07-05 NOTE — Progress Notes (Signed)
Thus far, There is no evidence of UTI by urinalysis.  However the culture is still pending  And will take another 24 hours.

## 2020-07-06 ENCOUNTER — Ambulatory Visit: Payer: Medicare Other | Admitting: Internal Medicine

## 2020-07-06 ENCOUNTER — Encounter: Payer: Self-pay | Admitting: Internal Medicine

## 2020-07-06 ENCOUNTER — Other Ambulatory Visit: Payer: Medicare Other

## 2020-07-06 VITALS — BP 140/64 | HR 92 | Temp 99.2°F | Ht 62.99 in | Wt 216.4 lb

## 2020-07-06 DIAGNOSIS — I700 Atherosclerosis of aorta: Secondary | ICD-10-CM

## 2020-07-06 DIAGNOSIS — N3091 Cystitis, unspecified with hematuria: Secondary | ICD-10-CM

## 2020-07-06 DIAGNOSIS — M48061 Spinal stenosis, lumbar region without neurogenic claudication: Secondary | ICD-10-CM

## 2020-07-06 DIAGNOSIS — E119 Type 2 diabetes mellitus without complications: Secondary | ICD-10-CM

## 2020-07-06 DIAGNOSIS — E785 Hyperlipidemia, unspecified: Secondary | ICD-10-CM

## 2020-07-06 DIAGNOSIS — E1169 Type 2 diabetes mellitus with other specified complication: Secondary | ICD-10-CM

## 2020-07-06 DIAGNOSIS — Z6841 Body Mass Index (BMI) 40.0 and over, adult: Secondary | ICD-10-CM

## 2020-07-06 MED ORDER — SULFAMETHOXAZOLE-TRIMETHOPRIM 800-160 MG PO TABS: 1.0000 | ORAL_TABLET | Freq: Two times a day (BID) | ORAL | 0 refills | Status: DC

## 2020-07-06 NOTE — Assessment & Plan Note (Signed)
I have congratulated her in reduction of   BMI and encouraged  Continued weight loss with goal of 10% of body weigh over the next 6 months using a low glycemic index diet and regular exercise a minimum of 5 days per week.    

## 2020-07-06 NOTE — Addendum Note (Signed)
Addended by: Leeanne Rio on: 07/06/2020 09:27 AM   Modules accepted: Orders

## 2020-07-06 NOTE — Progress Notes (Signed)
Sent an add on sheet to the lab and they said they will culture it :)

## 2020-07-06 NOTE — Assessment & Plan Note (Signed)
She has 3 vessel disease by 2017 coronary calcium scoring, aortic atherosclerosis  and  is tolerating atorvastatin without side effects. LDL  Is due for repeat assessment   Lab Results  Component Value Date   CHOL 127 12/25/2019   HDL 46.30 12/25/2019   LDLCALC 56 12/25/2019   TRIG 123.0 12/25/2019   CHOLHDL 3 12/25/2019   Lab Results  Component Value Date   ALT 25 12/25/2019   AST 22 12/25/2019   ALKPHOS 59 12/25/2019   BILITOT 0.8 12/25/2019

## 2020-07-06 NOTE — Assessment & Plan Note (Signed)
She is losing weight due to decreased appetite and improved diet.  a1c pending   Lab Results  Component Value Date   HGBA1C 6.7 (A) 03/31/2020

## 2020-07-06 NOTE — Addendum Note (Signed)
Addended by: Leeanne Rio on: 07/06/2020 09:31 AM   Modules accepted: Orders

## 2020-07-06 NOTE — Assessment & Plan Note (Signed)
For surgical decompression mid October.  Continue vicodin  Use and bowel regimen to prevent recurrent constipation

## 2020-07-06 NOTE — Assessment & Plan Note (Signed)
Repeat UA not conclusive,  But patient's symptoms are progressing  Empiric septra Ds sent to pharmacy pending urine culture

## 2020-07-06 NOTE — Progress Notes (Signed)
Subjective:  Patient ID: Carol [name], female    DOB: July 16, 1946  Age: 74 y.o. MRN: [id number]  CC: The primary encounter diagnosis was Hemorrhagic cystitis. Diagnoses of Abdominal aortic atherosclerosis ([place]), Type 2 diabetes mellitus without complication, without long-term current use of insulin ([place]), Hyperlipidemia associated with type 2 diabetes mellitus ([place]), Class 3 severe obesity with serious comorbidity and body mass index (BMI) of 40.0 to 44.9 in adult, unspecified obesity type ([place]), and Spinal stenosis at L4-L5 level were also pertinent to this visit.  HPI Carol [name] presents for follow up on 1) HISTORY OF hemorrhagic cystitis with probable recurrence 2) back pain   bACK PAIN:  Surgery planned in mid October along with CTR of right wrist.  Using hydrocodone 4 times daily.  averaging 1.5 hours of sleep per night before waking up.  Does not want anything stronger.  planning to have private duty aide for 3 weeks    Frequent urination started  On Monday and developed hematuria yesterday.  Saw a few clots  But has not seen any today  Today having suprapubic pressure worse today than yesterday.  Started taking azo.   Last UTI was in mid August  Treated with cephalexin.  Current UA from 9/21 no RBC   3-6 WBCs,  Rare  Bacteria.    Outpatient Medications Prior to Visit  Medication Sig Dispense Refill  . Ascorbic Acid (VITAMIN C) 1000 MG tablet Take 1,000 mg by mouth daily.    Marland Kitchen aspirin 81 MG tablet Take 81 mg by mouth daily.     Marland Kitchen atorvastatin (LIPITOR) 40 MG tablet Take 1 tablet (40 mg total) by mouth daily. 90 tablet 1  . cephALEXin (KEFLEX) 500 MG capsule Take 1 capsule (500 mg total) by mouth 2 (two) times daily. 14 capsule 0  . cetirizine (ZYRTEC) 10 MG tablet Take 10 mg by mouth every morning.     . Cholecalciferol (VITAMIN D3) 2000 UNITS capsule Take 2,000 Units by mouth daily.      . Coenzyme Q10 (COQ-10) 200 MG CAPS Take 200 mg by mouth daily.    .  fluticasone (FLONASE) 50 MCG/ACT nasal spray Place 2 sprays into both nostrils daily. (Patient taking differently: Place 2 sprays into both nostrils daily as needed for allergies. ) 16 g 6  . furosemide (LASIX) 20 MG tablet Take 1 tablet (20 mg total) by mouth 2 (two) times daily. 180 tablet 3  . Glucosamine Sulfate 1000 MG CAPS Take 1,000 mg by mouth daily.     Marland Kitchen HYDROcodone-acetaminophen (NORCO) 10-325 MG tablet Take 1 tablet by mouth every 6 (six) hours as needed. 120 tablet 0  . letrozole (FEMARA) 2.5 MG tablet Take 1 tablet (2.5 mg total) by mouth daily. 90 tablet 3  . levothyroxine (SYNTHROID) 50 MCG tablet TAKE 1 TABLET EVERY DAY ON EMPTY STOMACHWITH A GLASS OF WATER AT LEAST 30-60 MINBEFORE BREAKFAST 90 tablet 0  . Misc Natural Products (OSTEO BI-FLEX JOINT SHIELD) TABS Take 2 tablets by mouth daily. With magnesium     . Multiple Vitamin (MULTIVITAMIN) tablet Take 1 tablet by mouth daily.    . NEOMYCIN-POLYMYXIN-HYDROCORTISONE (CORTISPORIN) 1 % SOLN OTIC solution Apply 1-2 drops to toe BID after soaking 10 mL 1  . Olopatadine HCl (PATADAY OP) Place 1 drop into both eyes daily as needed (allergies).    . phenazopyridine (PYRIDIUM) 200 MG tablet Take 1 tablet (200 mg total) by mouth 3 (three) times daily as needed for pain. 6 tablet 0  .  Polyvinyl Alcohol-Povidone (REFRESH OP) Place 1 drop into both eyes daily as needed (dry eyes).    Marland Kitchen rOPINIRole (REQUIP) 0.5 MG tablet Take 1 tablet (0.5 mg total) by mouth at bedtime. 90 tablet 3  . senna (SENOKOT) 8.6 MG tablet Take 1-6 tablets by mouth at bedtime.      No facility-administered medications prior to visit.    Review of Systems;  Patient denies headache, fevers, malaise, unintentional weight loss, skin rash, eye pain, sinus congestion and sinus pain, sore throat, dysphagia,  hemoptysis , cough, dyspnea, wheezing, chest pain, palpitations, orthopnea, edema, abdominal pain, nausea, melena, diarrhea, constipation, flank pain, dysuria,  hematuria, urinary  Frequency, nocturia, numbness, tingling, seizures,  Focal weakness, Loss of consciousness,  Tremor, insomnia, depression, anxiety, and suicidal ideation.      Objective:  BP 140/64 (BP Location: Left Arm, Patient Position: Sitting)   Pulse 92   Temp 99.2 F (37.3 C)   Ht 5' 2.99" (1.6 m)   Wt 216 lb 6.4 oz (98.2 kg)   SpO2 92%   BMI 38.34 kg/m   BP Readings from Last 3 Encounters:  07/06/20 140/64  06/01/20 122/68  05/28/20 136/60    Wt Readings from Last 3 Encounters:  07/06/20 216 lb 6.4 oz (98.2 kg)  06/01/20 219 lb (99.3 kg)  05/28/20 228 lb (103.4 kg)    General appearance: alert, cooperative and appears stated age Ears: normal TM's and external ear canals both ears Throat: lips, mucosa, and tongue normal; teeth and gums normal Neck: no adenopathy, no carotid bruit, supple, symmetrical, trachea midline and thyroid not enlarged, symmetric, no tenderness/mass/nodules Back: symmetric, no curvature. ROM normal. No CVA tenderness. Lungs: clear to auscultation bilaterally Heart: regular rate and rhythm, S1, S2 normal, no murmur, click, rub or gallop Abdomen: soft, non-tender; bowel sounds normal; no masses,  no organomegaly Pulses: 2+ and symmetric Skin: Skin color, texture, turgor normal. No rashes or lesions Lymph nodes: Cervical, supraclavicular, and axillary nodes normal.  Lab Results  Component Value Date   HGBA1C 6.7 (A) 03/31/2020   HGBA1C 6.6 (H) 12/25/2019   HGBA1C 6.4 10/03/2018    Lab Results  Component Value Date   CREATININE 1.03 (H) 05/15/2020   CREATININE 0.88 12/25/2019   CREATININE 0.91 10/03/2018    Lab Results  Component Value Date   WBC 11.5 (H) 05/15/2020   HGB 12.8 05/15/2020   HCT 36.2 05/15/2020   PLT 180 05/15/2020   GLUCOSE 181 (H) 05/15/2020   CHOL 127 12/25/2019   TRIG 123.0 12/25/2019   HDL 46.30 12/25/2019   LDLCALC 56 12/25/2019   ALT 25 12/25/2019   AST 22 12/25/2019   NA 142 05/15/2020   K 3.2 (L)  05/15/2020   CL 100 05/15/2020   CREATININE 1.03 (H) 05/15/2020   BUN 10 05/15/2020   CO2 32 05/15/2020   TSH 2.08 12/25/2019   HGBA1C 6.7 (A) 03/31/2020   MICROALBUR 1.2 07/05/2020    No results found.  Assessment & Plan:   Problem List Items Addressed This Visit      Unprioritized   Hyperlipidemia associated with type 2 diabetes mellitus ([place])    She has 3 vessel disease by 2017 coronary calcium scoring, aortic atherosclerosis  and  is tolerating atorvastatin without side effects. LDL  Is due for repeat assessment   Lab Results  Component Value Date   CHOL 127 12/25/2019   HDL 46.30 12/25/2019   LDLCALC 56 12/25/2019   TRIG 123.0 12/25/2019   CHOLHDL 3 12/25/2019  Lab Results  Component Value Date   ALT 25 12/25/2019   AST 22 12/25/2019   ALKPHOS 59 12/25/2019   BILITOT 0.8 12/25/2019         Obesity    I have congratulated her in reduction of   BMI and encouraged  Continued weight loss with goal of 10% of body weigh over the next 6 months using a low glycemic index diet and regular exercise a minimum of 5 days per week.        Type 2 diabetes mellitus without complication, without long-term current use of insulin ([facility])    She is losing weight due to decreased appetite and improved diet.  a1c pending   Lab Results  Component Value Date   HGBA1C 6.7 (A) 03/31/2020         Abdominal aortic atherosclerosis ([place])    Discussed CT with patient at last visit repeat lipids due after increasing  atorvastatin to 40 mg daily       Spinal stenosis at L4-L5 level    For surgical decompression mid October.  Continue vicodin  Use and bowel regimen to prevent recurrent constipation      Hemorrhagic cystitis - Primary    Repeat UA not conclusive,  But patient's symptoms are progressing  Empiric septra Ds sent to pharmacy pending urine culture       Relevant Orders   Urinalysis, Routine w reflex microscopic      I am having Carol [name] start on  sulfamethoxazole-trimethoprim. I am also having her maintain her Glucosamine Sulfate, Osteo Bi-Flex Joint Shield, Vitamin D3, multivitamin, fluticasone, aspirin, cetirizine, vitamin C, CoQ-10, senna, Olopatadine HCl (PATADAY OP), Polyvinyl Alcohol-Povidone (REFRESH OP), letrozole, rOPINIRole, furosemide, NEOMYCIN-POLYMYXIN-HYDROCORTISONE, levothyroxine, phenazopyridine, atorvastatin, cephALEXin, and HYDROcodone-acetaminophen.  Meds ordered this encounter  Medications  . sulfamethoxazole-trimethoprim (BACTRIM DS) 800-160 MG tablet    Sig: Take 1 tablet by mouth 2 (two) times daily.    Dispense:  10 tablet    Refill:  0    There are no discontinued medications.  Follow-up: No follow-ups on file.   Crecencio Mc, MD

## 2020-07-06 NOTE — Patient Instructions (Signed)
Since your urinary symptoms are getting worse,  I have called in Septra DS to take twice daily for 5 days  The urine culture , once resulted,  May necessitate a CHANGE in therapy

## 2020-07-06 NOTE — Assessment & Plan Note (Signed)
Discussed CT with patient at last visit repeat lipids due after increasing  atorvastatin to 40 mg daily

## 2020-07-06 NOTE — Addendum Note (Signed)
Addended by: Leeanne Rio on: 07/06/2020 10:33 AM   Modules accepted: Orders

## 2020-07-08 LAB — URINE CULTURE
MICRO NUMBER:: 10982004
SPECIMEN QUALITY:: ADEQUATE

## 2020-07-08 NOTE — Progress Notes (Signed)
You do appear to have a UTI from E coli.  Continue the Septra DS 2 times daily  for now and I will update you once I have the sensitivity panel   Please take a probiotic ( Align, [place] or Culturelle) for 2 weeks if you start the antibiotic to prevent a serious antibiotic associated diarrhea  Called" clostridium dificile colitis" ( should also help prevent   vaginal yeast infection)   Regards,   Deborra Medina, MD

## 2020-07-11 ENCOUNTER — Other Ambulatory Visit (INDEPENDENT_AMBULATORY_CARE_PROVIDER_SITE_OTHER): Payer: Medicare Other

## 2020-07-11 ENCOUNTER — Other Ambulatory Visit: Payer: Self-pay

## 2020-07-11 DIAGNOSIS — E1169 Type 2 diabetes mellitus with other specified complication: Secondary | ICD-10-CM | POA: Diagnosis not present

## 2020-07-11 DIAGNOSIS — E785 Hyperlipidemia, unspecified: Secondary | ICD-10-CM

## 2020-07-11 DIAGNOSIS — N3091 Cystitis, unspecified with hematuria: Secondary | ICD-10-CM | POA: Diagnosis not present

## 2020-07-11 DIAGNOSIS — E119 Type 2 diabetes mellitus without complications: Secondary | ICD-10-CM

## 2020-07-11 LAB — URINALYSIS, ROUTINE W REFLEX MICROSCOPIC
Bilirubin Urine: NEGATIVE
Hgb urine dipstick: NEGATIVE
Ketones, ur: NEGATIVE
Leukocytes,Ua: NEGATIVE
RBC / HPF: NONE SEEN (ref 0–?)
Specific Gravity, Urine: 1.015 (ref 1.000–1.030)
Total Protein, Urine: NEGATIVE
Urine Glucose: NEGATIVE
Urobilinogen, UA: 1 (ref 0.0–1.0)
pH: 7 (ref 5.0–8.0)

## 2020-07-11 LAB — COMPREHENSIVE METABOLIC PANEL
ALT: 15 U/L (ref 0–35)
AST: 16 U/L (ref 0–37)
Albumin: 4.2 g/dL (ref 3.5–5.2)
Alkaline Phosphatase: 45 U/L (ref 39–117)
BUN: 9 mg/dL (ref 6–23)
CO2: 33 mEq/L — ABNORMAL HIGH (ref 19–32)
Calcium: 9.2 mg/dL (ref 8.4–10.5)
Chloride: 102 mEq/L (ref 96–112)
Creatinine, Ser: 1.16 mg/dL (ref 0.40–1.20)
GFR: 45.76 mL/min — ABNORMAL LOW (ref >60.00)
Glucose, Bld: 113 mg/dL — ABNORMAL HIGH (ref 70–99)
Potassium: 4.3 mEq/L (ref 3.5–5.1)
Sodium: 142 mEq/L (ref 135–145)
Total Bilirubin: 0.7 mg/dL (ref 0.2–1.2)
Total Protein: 6.4 g/dL (ref 6.0–8.3)

## 2020-07-11 LAB — HEMOGLOBIN A1C: Hgb A1c MFr Bld: 5.8 % (ref 4.6–6.5)

## 2020-07-11 LAB — LIPID PANEL
Cholesterol: 122 mg/dL (ref 0–200)
HDL: 49.4 mg/dL (ref >39.00)
LDL Cholesterol: 47 mg/dL (ref 0–99)
NonHDL: 72.21
Total CHOL/HDL Ratio: 2
Triglycerides: 126 mg/dL (ref 0.0–149.0)
VLDL: 25.2 mg/dL (ref 0.0–40.0)

## 2020-07-12 LAB — URINE CULTURE
MICRO NUMBER:: 10999126
Result:: NO GROWTH
SPECIMEN QUALITY:: ADEQUATE

## 2020-07-13 NOTE — Progress Notes (Signed)
YOUR REPEAT URINE CULTURE WAS NORMAL/NEGATIVE Your other labs look fine . Your a1c has dropped to 5.8 which is excellent  Regards,   Deborra Medina, MD

## 2020-07-18 NOTE — Progress Notes (Signed)
Your procedure is scheduled on Monday, July 25, 2020.  Report to [facility] Main Entrance "A" at 5:30 A.M., and check in at the Admitting office.  Call this number if you have problems the morning of surgery:  [phone]  Call [phone] if you have any questions prior to your surgery date Monday-Friday 8am-4pm    Remember:  Do not eat or drink after midnight the night before your surgery    Take these medicines the morning of surgery with A SIP OF WATER:  atorvastatin (LIPITOR) cetirizine (ZYRTEC)  letrozole (FEMARA)  levothyroxine (SYNTHROID) sulfamethoxazole-trimethoprim (BACTRIM DS)  If needed:  HYDROcodone-acetaminophen (NORCO) Olopatadine HCl (PATADAY OP) phenazopyridine (PYRIDIUM) Polyvinyl Alcohol-Povidone (REFRESH OP)  Follow your surgeon's instructions on when to stop Aspirin.  If no instructions were given by your surgeon then you will need to call the office to get those instructions.     As of today, STOP taking any Aleve, Naproxen, Ibuprofen, Motrin, Advil, Goody's, BC's, all herbal medications, fish oil, and all vitamins.    HOW TO MANAGE YOUR DIABETES BEFORE AND AFTER SURGERY  Why is it important to control my blood sugar before and after surgery? . Improving blood sugar levels before and after surgery helps healing and can limit problems. . A way of improving blood sugar control is eating a healthy diet by: o  Eating less sugar and carbohydrates o  Increasing activity/exercise o  Talking with your doctor about reaching your blood sugar goals . High blood sugars (greater than 180 mg/dL) can raise your risk of infections and slow your recovery, so you will need to focus on controlling your diabetes during the weeks before surgery. . Make sure that the doctor who takes care of your diabetes knows about your planned surgery including the date and location.  How do I manage my blood sugar before surgery? . Check your blood sugar at least 4 times a  day, starting 2 days before surgery, to make sure that the level is not too high or low. . Check your blood sugar the morning of your surgery when you wake up and every 2 hours until you get to the Short Stay unit. o If your blood sugar is less than 70 mg/dL, you will need to treat for low blood sugar: - Do not take insulin. - Treat a low blood sugar (less than 70 mg/dL) with  cup of clear juice (cranberry or apple), 4 glucose tablets, OR glucose gel. - Recheck blood sugar in 15 minutes after treatment (to make sure it is greater than 70 mg/dL). If your blood sugar is not greater than 70 mg/dL on recheck, call [phone] for further instructions. . Report your blood sugar to the short stay nurse when you get to Short Stay.  . If you are admitted to the hospital after surgery: o Your blood sugar will be checked by the staff and you will probably be given insulin after surgery (instead of oral diabetes medicines) to make sure you have good blood sugar levels. o The goal for blood sugar control after surgery is 80-180 mg/dL.                      Do not wear jewelry, make up, or nail polish            Do not wear lotions, powders, perfumes, or deodorant.            Do not shave 48 hours prior to surgery.  Do not bring valuables to the hospital.            [facility] is not responsible for any belongings or valuables.  Do NOT Smoke (Tobacco/Vaping) or drink Alcohol 24 hours prior to your procedure If you use a CPAP at night, you may bring all equipment for your overnight stay.   Contacts, glasses, dentures or bridgework may not be worn into surgery.      For patients admitted to the hospital, discharge time will be determined by your treatment team.   Patients discharged the day of surgery will not be allowed to drive home, and someone needs to stay with them for 24 hours.    Special instructions:   [place]- Preparing For Surgery  Before surgery, you can play an  important role. Because skin is not sterile, your skin needs to be as free of germs as possible. You can reduce the number of germs on your skin by washing with CHG (chlorahexidine gluconate) Soap before surgery.  CHG is an antiseptic cleaner which kills germs and bonds with the skin to continue killing germs even after washing.    Oral Hygiene is also important to reduce your risk of infection.  Remember - BRUSH YOUR TEETH THE MORNING OF SURGERY WITH YOUR REGULAR TOOTHPASTE  Please do not use if you have an allergy to CHG or antibacterial soaps. If your skin becomes reddened/irritated stop using the CHG.  Do not shave (including legs and underarms) for at least 48 hours prior to first CHG shower. It is OK to shave your face.  Please follow these instructions carefully.   1. Shower the NIGHT BEFORE SURGERY and the MORNING OF SURGERY with CHG Soap.   2. If you chose to wash your hair, wash your hair first as usual with your normal shampoo.  3. After you shampoo, rinse your hair and body thoroughly to remove the shampoo.  4. Use CHG as you would any other liquid soap. You can apply CHG directly to the skin and wash gently with a scrungie or a clean washcloth.   5. Apply the CHG Soap to your body ONLY FROM THE NECK DOWN.  Do not use on open wounds or open sores. Avoid contact with your eyes, ears, mouth and genitals (private parts). Wash Face and genitals (private parts)  with your normal soap.   6. Wash thoroughly, paying special attention to the area where your surgery will be performed.  7. Thoroughly rinse your body with warm water from the neck down.  8. DO NOT shower/wash with your normal soap after using and rinsing off the CHG Soap.  9. Pat yourself dry with a CLEAN TOWEL.  10. Wear CLEAN PAJAMAS to bed the night before surgery  11. Place CLEAN SHEETS on your bed the night of your first shower and DO NOT SLEEP WITH PETS.   Day of Surgery: Wear Clean/Comfortable clothing the  morning of surgery Do not apply any deodorants/lotions.   Remember to brush your teeth WITH YOUR REGULAR TOOTHPASTE.   Please read over the following fact sheets that you were given.

## 2020-07-19 ENCOUNTER — Other Ambulatory Visit: Payer: Self-pay

## 2020-07-19 ENCOUNTER — Ambulatory Visit (HOSPITAL_COMMUNITY)
Admission: RE | Admit: 2020-07-19 | Discharge: 2020-07-19 | Disposition: A | Discharge status: Home / Self Care | Payer: Medicare Other | Source: Ambulatory Visit | Attending: Neurological Surgery | Admitting: Neurological Surgery

## 2020-07-19 ENCOUNTER — Encounter (HOSPITAL_COMMUNITY)
Admission: RE | Admit: 2020-07-19 | Discharge: 2020-07-19 | Disposition: A | Discharge status: Home / Self Care | Payer: Medicare Other | Source: Ambulatory Visit | Attending: Neurological Surgery | Admitting: Neurological Surgery

## 2020-07-19 ENCOUNTER — Encounter (HOSPITAL_COMMUNITY): Payer: Self-pay

## 2020-07-19 DIAGNOSIS — M4310 Spondylolisthesis, site unspecified: Secondary | ICD-10-CM | POA: Diagnosis not present

## 2020-07-19 DIAGNOSIS — Z01818 Encounter for other preprocedural examination: Secondary | ICD-10-CM | POA: Insufficient documentation

## 2020-07-19 LAB — CBC WITH DIFFERENTIAL/PLATELET
Abs Immature Granulocytes: 0.02 10*3/uL (ref 0.00–0.07)
Basophils Absolute: 0.1 10*3/uL (ref 0.0–0.1)
Basophils Relative: 1 %
Eosinophils Absolute: 0.2 10*3/uL (ref 0.0–0.5)
Eosinophils Relative: 2 %
HCT: 41.2 % (ref 36.0–46.0)
Hemoglobin: 13.6 g/dL (ref 12.0–15.0)
Immature Granulocytes: 0 %
Lymphocytes Relative: 22 %
Lymphs Abs: 1.6 10*3/uL (ref 0.7–4.0)
MCH: 30.7 pg (ref 26.0–34.0)
MCHC: 33 g/dL (ref 30.0–36.0)
MCV: 93 fL (ref 80.0–100.0)
Monocytes Absolute: 0.7 10*3/uL (ref 0.1–1.0)
Monocytes Relative: 9 %
Neutro Abs: 4.8 10*3/uL (ref 1.7–7.7)
Neutrophils Relative %: 66 %
Platelets: 214 10*3/uL (ref 150–400)
RBC: 4.43 MIL/uL (ref 3.87–5.11)
RDW: 13.3 % (ref 11.5–15.5)
WBC: 7.4 10*3/uL (ref 4.0–10.5)
nRBC: 0 % (ref 0.0–0.2)

## 2020-07-19 LAB — GLUCOSE, CAPILLARY: Glucose-Capillary: 109 mg/dL — ABNORMAL HIGH (ref 70–99)

## 2020-07-19 LAB — SURGICAL PCR SCREEN
MRSA, PCR: NEGATIVE
Staphylococcus aureus: NEGATIVE

## 2020-07-19 LAB — TYPE AND SCREEN
ABO/RH(D): O POS
Antibody Screen: NEGATIVE

## 2020-07-19 LAB — APTT: aPTT: 32 seconds (ref 24–36)

## 2020-07-19 NOTE — Progress Notes (Signed)
PCP - Dr. Deborra Medina Cardiologist - Denies  PPM/ICD - Denies  Chest x-ray - 07/19/20 EKG - 07/19/20 Stress Test - 10/24/05 ECHO - Denies Cardiac Cath - Denies  Sleep Study - Denies  Patient is pre-diabetic, but does not check her blood sugars.  Blood Thinner Instructions: N/A Aspirin Instructions: Patient instructed to contact surgeon's office.  ERAS Protcol - No  COVID TEST- 07/21/20   Anesthesia review: No  Patient denies shortness of breath, fever, cough and chest pain at PAT appointment   All instructions explained to the patient, with a verbal understanding of the material. Patient agrees to go over the instructions while at home for a better understanding. Patient also instructed to self quarantine after being tested for COVID-19. The opportunity to ask questions was provided.

## 2020-07-21 ENCOUNTER — Other Ambulatory Visit
Admission: RE | Admit: 2020-07-21 | Discharge: 2020-07-21 | Disposition: A | Discharge status: Home / Self Care | Payer: Medicare Other | Source: Ambulatory Visit | Attending: Neurological Surgery | Admitting: Neurological Surgery

## 2020-07-21 ENCOUNTER — Other Ambulatory Visit: Payer: Self-pay

## 2020-07-21 DIAGNOSIS — Z01812 Encounter for preprocedural laboratory examination: Secondary | ICD-10-CM | POA: Diagnosis present

## 2020-07-21 DIAGNOSIS — Z20822 Contact with and (suspected) exposure to covid-19: Secondary | ICD-10-CM | POA: Insufficient documentation

## 2020-07-21 LAB — SARS CORONAVIRUS 2 (TAT 6-24 HRS): SARS Coronavirus 2: NEGATIVE

## 2020-07-24 NOTE — Anesthesia Preprocedure Evaluation (Addendum)
Anesthesia Evaluation  Patient identified by MRN, date of birth, ID band Patient awake    Reviewed: Allergy & Precautions, NPO status , Patient's Chart, lab work & pertinent test results  Airway Mallampati: II  TM Distance: >3 FB Neck ROM: Full    Dental no notable dental hx. (+) Dental Advisory Given   Pulmonary neg pulmonary ROS, former smoker,    Pulmonary exam normal        Cardiovascular negative cardio ROS Normal cardiovascular exam     Neuro/Psych negative neurological ROS     GI/Hepatic negative GI ROS, Neg liver ROS,   Endo/Other  diabetesHypothyroidism   Renal/GU negative Renal ROS     Musculoskeletal  (+) Fibromyalgia -  Abdominal   Peds  Hematology negative hematology ROS (+)   Anesthesia Other Findings   Reproductive/Obstetrics                            Anesthesia Physical Anesthesia Plan  ASA: II  Anesthesia Plan: General   Post-op Pain Management:    Induction: Intravenous  PONV Risk Score and Plan: 4 or greater and Ondansetron, Dexamethasone, Diphenhydramine and Treatment may vary due to age or medical condition  Airway Management Planned: Oral ETT  Additional Equipment:   Intra-op Plan:   Post-operative Plan: Extubation in OR  Informed Consent: I have reviewed the patients History and Physical, chart, labs and discussed the procedure including the risks, benefits and alternatives for the proposed anesthesia with the patient or authorized representative who has indicated his/her understanding and acceptance.     Dental advisory given  Plan Discussed with: Anesthesiologist and CRNA  Anesthesia Plan Comments:        Anesthesia Quick Evaluation

## 2020-07-25 ENCOUNTER — Inpatient Hospital Stay (HOSPITAL_COMMUNITY)
Admission: RE | Admit: 2020-07-25 | Discharge: 2020-07-26 | DRG: 455 | Disposition: A | Discharge status: Home Health | Payer: Medicare Other | Attending: Neurological Surgery | Admitting: Neurological Surgery

## 2020-07-25 ENCOUNTER — Inpatient Hospital Stay (HOSPITAL_COMMUNITY): Payer: Medicare Other | Admitting: Anesthesiology

## 2020-07-25 ENCOUNTER — Encounter (HOSPITAL_COMMUNITY)
Admission: RE | Disposition: A | Discharge status: Home Health | Payer: Self-pay | Source: Home / Self Care | Attending: Neurological Surgery

## 2020-07-25 ENCOUNTER — Other Ambulatory Visit: Payer: Self-pay

## 2020-07-25 ENCOUNTER — Inpatient Hospital Stay (HOSPITAL_COMMUNITY): Payer: Medicare Other

## 2020-07-25 ENCOUNTER — Encounter (HOSPITAL_COMMUNITY): Payer: Self-pay | Admitting: Neurological Surgery

## 2020-07-25 DIAGNOSIS — E785 Hyperlipidemia, unspecified: Secondary | ICD-10-CM | POA: Diagnosis present

## 2020-07-25 DIAGNOSIS — Z79890 Hormone replacement therapy (postmenopausal): Secondary | ICD-10-CM

## 2020-07-25 DIAGNOSIS — Z923 Personal history of irradiation: Secondary | ICD-10-CM | POA: Diagnosis not present

## 2020-07-25 DIAGNOSIS — Z853 Personal history of malignant neoplasm of breast: Secondary | ICD-10-CM | POA: Diagnosis not present

## 2020-07-25 DIAGNOSIS — E669 Obesity, unspecified: Secondary | ICD-10-CM | POA: Diagnosis present

## 2020-07-25 DIAGNOSIS — Z833 Family history of diabetes mellitus: Secondary | ICD-10-CM | POA: Diagnosis not present

## 2020-07-25 DIAGNOSIS — Z9011 Acquired absence of right breast and nipple: Secondary | ICD-10-CM

## 2020-07-25 DIAGNOSIS — M797 Fibromyalgia: Secondary | ICD-10-CM | POA: Diagnosis present

## 2020-07-25 DIAGNOSIS — Z79899 Other long term (current) drug therapy: Secondary | ICD-10-CM

## 2020-07-25 DIAGNOSIS — Z83438 Family history of other disorder of lipoprotein metabolism and other lipidemia: Secondary | ICD-10-CM

## 2020-07-25 DIAGNOSIS — Z823 Family history of stroke: Secondary | ICD-10-CM

## 2020-07-25 DIAGNOSIS — G5603 Carpal tunnel syndrome, bilateral upper limbs: Secondary | ICD-10-CM | POA: Diagnosis present

## 2020-07-25 DIAGNOSIS — M4316 Spondylolisthesis, lumbar region: Secondary | ICD-10-CM | POA: Diagnosis present

## 2020-07-25 DIAGNOSIS — Z79811 Long term (current) use of aromatase inhibitors: Secondary | ICD-10-CM

## 2020-07-25 DIAGNOSIS — M48062 Spinal stenosis, lumbar region with neurogenic claudication: Principal | ICD-10-CM | POA: Diagnosis present

## 2020-07-25 DIAGNOSIS — Z981 Arthrodesis status: Secondary | ICD-10-CM

## 2020-07-25 DIAGNOSIS — Z6837 Body mass index (BMI) 37.0-37.9, adult: Secondary | ICD-10-CM | POA: Diagnosis not present

## 2020-07-25 DIAGNOSIS — Z8249 Family history of ischemic heart disease and other diseases of the circulatory system: Secondary | ICD-10-CM | POA: Diagnosis not present

## 2020-07-25 DIAGNOSIS — Z803 Family history of malignant neoplasm of breast: Secondary | ICD-10-CM | POA: Diagnosis not present

## 2020-07-25 DIAGNOSIS — Z8719 Personal history of other diseases of the digestive system: Secondary | ICD-10-CM | POA: Diagnosis not present

## 2020-07-25 DIAGNOSIS — Z7982 Long term (current) use of aspirin: Secondary | ICD-10-CM

## 2020-07-25 DIAGNOSIS — Z87891 Personal history of nicotine dependence: Secondary | ICD-10-CM | POA: Diagnosis not present

## 2020-07-25 DIAGNOSIS — Z825 Family history of asthma and other chronic lower respiratory diseases: Secondary | ICD-10-CM | POA: Diagnosis not present

## 2020-07-25 DIAGNOSIS — Z419 Encounter for procedure for purposes other than remedying health state, unspecified: Principal | ICD-10-CM

## 2020-07-25 DIAGNOSIS — E039 Hypothyroidism, unspecified: Secondary | ICD-10-CM | POA: Diagnosis present

## 2020-07-25 HISTORY — PX: CARPAL TUNNEL RELEASE: SHX101

## 2020-07-25 LAB — ABO/RH: ABO/RH(D): O POS

## 2020-07-25 LAB — GLUCOSE, CAPILLARY: Glucose-Capillary: 136 mg/dL — ABNORMAL HIGH (ref 70–99)

## 2020-07-25 SURGERY — POSTERIOR LUMBAR FUSION 1 LEVEL
Anesthesia: General | Site: Back | Laterality: Right

## 2020-07-25 MED ORDER — LACTATED RINGERS IV SOLN: INTRAVENOUS | Status: DC

## 2020-07-25 MED ORDER — PROPOFOL 10 MG/ML IV BOLUS
INTRAVENOUS | Status: AC
  Filled 2020-07-25: qty 40

## 2020-07-25 MED ORDER — ONDANSETRON HCL 4 MG/2ML IJ SOLN
INTRAMUSCULAR | Status: AC
  Filled 2020-07-25: qty 8

## 2020-07-25 MED ORDER — METHOCARBAMOL 1000 MG/10ML IJ SOLN
500.0000 mg | Freq: Four times a day (QID) | INTRAVENOUS | Status: DC | PRN
  Filled 2020-07-25: qty 5

## 2020-07-25 MED ORDER — LETROZOLE 2.5 MG PO TABS
2.5000 mg | ORAL_TABLET | Freq: Every day | ORAL | Status: DC
  Filled 2020-07-25 (×2): qty 1

## 2020-07-25 MED ORDER — HEPARIN SODIUM (PORCINE) 1000 UNIT/ML IJ SOLN
INTRAMUSCULAR | Status: AC
  Filled 2020-07-25: qty 1

## 2020-07-25 MED ORDER — ONDANSETRON HCL 4 MG PO TABS: 4.0000 mg | ORAL_TABLET | Freq: Four times a day (QID) | ORAL | Status: DC | PRN

## 2020-07-25 MED ORDER — 0.9 % SODIUM CHLORIDE (POUR BTL) OPTIME
TOPICAL | Status: DC | PRN
  Administered 2020-07-25: 1000 mL

## 2020-07-25 MED ORDER — ROCURONIUM BROMIDE 10 MG/ML (PF) SYRINGE
PREFILLED_SYRINGE | INTRAVENOUS | Status: DC | PRN
  Administered 2020-07-25: 100 mg via INTRAVENOUS
  Administered 2020-07-25: 30 mg via INTRAVENOUS

## 2020-07-25 MED ORDER — DEXAMETHASONE SODIUM PHOSPHATE 10 MG/ML IJ SOLN
INTRAMUSCULAR | Status: DC | PRN
  Administered 2020-07-25: 10 mg via INTRAVENOUS

## 2020-07-25 MED ORDER — FENTANYL CITRATE (PF) 250 MCG/5ML IJ SOLN
INTRAMUSCULAR | Status: DC | PRN
Start: 2020-07-25 — End: 2020-07-25
  Administered 2020-07-25: 100 ug via INTRAVENOUS
  Administered 2020-07-25 (×2): 25 ug via INTRAVENOUS
  Administered 2020-07-25: 50 ug via INTRAVENOUS
  Administered 2020-07-25 (×2): 25 ug via INTRAVENOUS

## 2020-07-25 MED ORDER — ROCURONIUM BROMIDE 10 MG/ML (PF) SYRINGE
PREFILLED_SYRINGE | INTRAVENOUS | Status: AC
  Filled 2020-07-25: qty 20

## 2020-07-25 MED ORDER — SODIUM CHLORIDE (PF) 0.9 % IJ SOLN
INTRAMUSCULAR | Status: DC | PRN
  Administered 2020-07-25: 5 mL

## 2020-07-25 MED ORDER — CELECOXIB 200 MG PO CAPS
200.0000 mg | ORAL_CAPSULE | Freq: Two times a day (BID) | ORAL | Status: DC
  Administered 2020-07-25 (×2): 200 mg via ORAL
  Filled 2020-07-25 (×2): qty 1

## 2020-07-25 MED ORDER — PROPOFOL 10 MG/ML IV BOLUS
INTRAVENOUS | Status: DC | PRN
  Administered 2020-07-25: 170 mg via INTRAVENOUS

## 2020-07-25 MED ORDER — DEXAMETHASONE SODIUM PHOSPHATE 10 MG/ML IJ SOLN
INTRAMUSCULAR | Status: AC
  Filled 2020-07-25: qty 3

## 2020-07-25 MED ORDER — ASPIRIN EC 81 MG PO TBEC
81.0000 mg | DELAYED_RELEASE_TABLET | Freq: Every day | ORAL | Status: DC
  Administered 2020-07-25: 81 mg via ORAL
  Filled 2020-07-25: qty 1

## 2020-07-25 MED ORDER — DEXAMETHASONE 4 MG PO TABS
4.0000 mg | ORAL_TABLET | Freq: Four times a day (QID) | ORAL | Status: DC
  Administered 2020-07-25 – 2020-07-26 (×3): 4 mg via ORAL
  Filled 2020-07-25 (×3): qty 1

## 2020-07-25 MED ORDER — THROMBIN 5000 UNITS EX SOLR
OROMUCOSAL | Status: DC | PRN
  Administered 2020-07-25: 5 mL via TOPICAL

## 2020-07-25 MED ORDER — CELECOXIB 200 MG PO CAPS
200.0000 mg | ORAL_CAPSULE | Freq: Once | ORAL | Status: AC
  Administered 2020-07-25: 200 mg via ORAL
  Filled 2020-07-25: qty 1

## 2020-07-25 MED ORDER — METHOCARBAMOL 500 MG PO TABS
500.0000 mg | ORAL_TABLET | Freq: Four times a day (QID) | ORAL | Status: DC | PRN
  Administered 2020-07-25 – 2020-07-26 (×3): 500 mg via ORAL
  Filled 2020-07-25 (×2): qty 1

## 2020-07-25 MED ORDER — ONDANSETRON HCL 4 MG/2ML IJ SOLN
INTRAMUSCULAR | Status: DC | PRN
  Administered 2020-07-25: 4 mg via INTRAVENOUS

## 2020-07-25 MED ORDER — THROMBIN 20000 UNITS EX SOLR
CUTANEOUS | Status: DC | PRN
  Administered 2020-07-25: 20 mL via TOPICAL

## 2020-07-25 MED ORDER — POTASSIUM CHLORIDE IN NACL 20-0.9 MEQ/L-% IV SOLN: INTRAVENOUS | Status: DC

## 2020-07-25 MED ORDER — SUGAMMADEX SODIUM 200 MG/2ML IV SOLN
INTRAVENOUS | Status: DC | PRN
  Administered 2020-07-25: 200 mg via INTRAVENOUS

## 2020-07-25 MED ORDER — ONDANSETRON HCL 4 MG/2ML IJ SOLN: 4.0000 mg | Freq: Four times a day (QID) | INTRAMUSCULAR | Status: DC | PRN

## 2020-07-25 MED ORDER — ASCORBIC ACID 500 MG PO TABS
1000.0000 mg | ORAL_TABLET | Freq: Every day | ORAL | Status: DC
  Filled 2020-07-25: qty 2

## 2020-07-25 MED ORDER — CHLORHEXIDINE GLUCONATE CLOTH 2 % EX PADS: 6.0000 | MEDICATED_PAD | Freq: Once | CUTANEOUS | Status: DC

## 2020-07-25 MED ORDER — MENTHOL 3 MG MT LOZG: 1.0000 | LOZENGE | OROMUCOSAL | Status: DC | PRN

## 2020-07-25 MED ORDER — OXYCODONE HCL 5 MG PO TABS
10.0000 mg | ORAL_TABLET | ORAL | Status: DC | PRN
  Administered 2020-07-25 – 2020-07-26 (×5): 10 mg via ORAL
  Filled 2020-07-25 (×5): qty 2

## 2020-07-25 MED ORDER — ROPINIROLE HCL 1 MG PO TABS
0.5000 mg | ORAL_TABLET | Freq: Every day | ORAL | Status: DC
  Filled 2020-07-25: qty 1

## 2020-07-25 MED ORDER — LIDOCAINE 2% (20 MG/ML) 5 ML SYRINGE
INTRAMUSCULAR | Status: DC | PRN
  Administered 2020-07-25: 100 mg via INTRAVENOUS

## 2020-07-25 MED ORDER — CEFAZOLIN SODIUM-DEXTROSE 2-4 GM/100ML-% IV SOLN
2.0000 g | Freq: Three times a day (TID) | INTRAVENOUS | Status: AC
  Administered 2020-07-25 – 2020-07-26 (×2): 2 g via INTRAVENOUS
  Filled 2020-07-25 (×2): qty 100

## 2020-07-25 MED ORDER — ACETAMINOPHEN 650 MG RE SUPP: 650.0000 mg | RECTAL | Status: DC | PRN

## 2020-07-25 MED ORDER — ACETAMINOPHEN 500 MG PO TABS
1000.0000 mg | ORAL_TABLET | Freq: Once | ORAL | Status: AC
  Administered 2020-07-25: 1000 mg via ORAL
  Filled 2020-07-25: qty 2

## 2020-07-25 MED ORDER — SODIUM CHLORIDE 0.9% FLUSH
3.0000 mL | Freq: Two times a day (BID) | INTRAVENOUS | Status: DC
  Administered 2020-07-25: 3 mL via INTRAVENOUS

## 2020-07-25 MED ORDER — DIPHENHYDRAMINE HCL 50 MG/ML IJ SOLN
INTRAMUSCULAR | Status: DC | PRN
  Administered 2020-07-25: 12.5 mg via INTRAVENOUS

## 2020-07-25 MED ORDER — CHLORHEXIDINE GLUCONATE 0.12 % MT SOLN
15.0000 mL | Freq: Once | OROMUCOSAL | Status: AC
  Administered 2020-07-25: 15 mL via OROMUCOSAL
  Filled 2020-07-25: qty 15

## 2020-07-25 MED ORDER — SODIUM CHLORIDE 0.9 % IV SOLN
250.0000 mL | INTRAVENOUS | Status: DC
  Administered 2020-07-25: 250 mL via INTRAVENOUS

## 2020-07-25 MED ORDER — PHENYLEPHRINE HCL-NACL 10-0.9 MG/250ML-% IV SOLN
INTRAVENOUS | Status: DC | PRN
  Administered 2020-07-25: 20 ug/min via INTRAVENOUS

## 2020-07-25 MED ORDER — PHENOL 1.4 % MT LIQD: 1.0000 | OROMUCOSAL | Status: DC | PRN

## 2020-07-25 MED ORDER — FENTANYL CITRATE (PF) 250 MCG/5ML IJ SOLN
INTRAMUSCULAR | Status: AC
  Filled 2020-07-25: qty 5

## 2020-07-25 MED ORDER — LACTATED RINGERS IV SOLN: INTRAVENOUS | Status: DC | PRN

## 2020-07-25 MED ORDER — ORAL CARE MOUTH RINSE: 15.0000 mL | Freq: Once | OROMUCOSAL | Status: AC

## 2020-07-25 MED ORDER — FENTANYL CITRATE (PF) 100 MCG/2ML IJ SOLN
INTRAMUSCULAR | Status: AC
  Filled 2020-07-25: qty 2

## 2020-07-25 MED ORDER — PROMETHAZINE HCL 25 MG/ML IJ SOLN: 6.2500 mg | INTRAMUSCULAR | Status: DC | PRN

## 2020-07-25 MED ORDER — MIDAZOLAM HCL 2 MG/2ML IJ SOLN
INTRAMUSCULAR | Status: AC
  Filled 2020-07-25: qty 2

## 2020-07-25 MED ORDER — THROMBIN 5000 UNITS EX SOLR
CUTANEOUS | Status: AC
  Filled 2020-07-25: qty 5000

## 2020-07-25 MED ORDER — HEPARIN SODIUM (PORCINE) 1000 UNIT/ML IJ SOLN
INTRAMUSCULAR | Status: DC | PRN
  Administered 2020-07-25: 5000 [IU]

## 2020-07-25 MED ORDER — ARTHREX ANGEL - ACD-A SOLUTION (CHARTING ONLY) OPTIME
TOPICAL | Status: DC | PRN
  Administered 2020-07-25: 10 mL via TOPICAL

## 2020-07-25 MED ORDER — LEVOTHYROXINE SODIUM 25 MCG PO TABS
50.0000 ug | ORAL_TABLET | Freq: Every day | ORAL | Status: DC
  Administered 2020-07-26: 50 ug via ORAL
  Filled 2020-07-25: qty 2

## 2020-07-25 MED ORDER — THROMBIN 20000 UNITS EX SOLR
CUTANEOUS | Status: AC
  Filled 2020-07-25: qty 20000

## 2020-07-25 MED ORDER — SENNA 8.6 MG PO TABS
1.0000 | ORAL_TABLET | Freq: Two times a day (BID) | ORAL | Status: DC
  Administered 2020-07-25 (×2): 8.6 mg via ORAL
  Filled 2020-07-25 (×2): qty 1

## 2020-07-25 MED ORDER — SODIUM CHLORIDE 0.9% FLUSH: 3.0000 mL | INTRAVENOUS | Status: DC | PRN

## 2020-07-25 MED ORDER — DEXAMETHASONE SODIUM PHOSPHATE 4 MG/ML IJ SOLN: 4.0000 mg | Freq: Four times a day (QID) | INTRAMUSCULAR | Status: DC

## 2020-07-25 MED ORDER — DEXAMETHASONE SODIUM PHOSPHATE 10 MG/ML IJ SOLN
10.0000 mg | Freq: Once | INTRAMUSCULAR | Status: DC
  Filled 2020-07-25: qty 1

## 2020-07-25 MED ORDER — LIDOCAINE 2% (20 MG/ML) 5 ML SYRINGE
INTRAMUSCULAR | Status: AC
  Filled 2020-07-25: qty 5

## 2020-07-25 MED ORDER — ALBUMIN HUMAN 5 % IV SOLN: INTRAVENOUS | Status: DC | PRN

## 2020-07-25 MED ORDER — BUPIVACAINE HCL (PF) 0.25 % IJ SOLN
INTRAMUSCULAR | Status: AC
  Filled 2020-07-25: qty 30

## 2020-07-25 MED ORDER — ACETAMINOPHEN 325 MG PO TABS: 650.0000 mg | ORAL_TABLET | ORAL | Status: DC | PRN

## 2020-07-25 MED ORDER — FENTANYL CITRATE (PF) 100 MCG/2ML IJ SOLN
25.0000 ug | INTRAMUSCULAR | Status: DC | PRN
  Administered 2020-07-25 (×3): 25 ug via INTRAVENOUS

## 2020-07-25 MED ORDER — FUROSEMIDE 20 MG PO TABS
20.0000 mg | ORAL_TABLET | Freq: Two times a day (BID) | ORAL | Status: DC
  Filled 2020-07-25: qty 1

## 2020-07-25 MED ORDER — METHOCARBAMOL 500 MG PO TABS
ORAL_TABLET | ORAL | Status: AC
  Filled 2020-07-25: qty 1

## 2020-07-25 MED ORDER — BUPIVACAINE HCL (PF) 0.25 % IJ SOLN
INTRAMUSCULAR | Status: DC | PRN
  Administered 2020-07-25 (×2): 6 mL

## 2020-07-25 MED ORDER — CEFAZOLIN SODIUM-DEXTROSE 2-4 GM/100ML-% IV SOLN
2.0000 g | INTRAVENOUS | Status: AC
  Administered 2020-07-25: 2 g via INTRAVENOUS
  Filled 2020-07-25: qty 100

## 2020-07-25 MED ORDER — MORPHINE SULFATE (PF) 2 MG/ML IV SOLN
2.0000 mg | INTRAVENOUS | Status: DC | PRN
  Administered 2020-07-25: 2 mg via INTRAVENOUS
  Filled 2020-07-25: qty 1

## 2020-07-25 MED ORDER — VITAMIN D 25 MCG (1000 UNIT) PO TABS
2000.0000 [IU] | ORAL_TABLET | Freq: Every day | ORAL | Status: DC
  Filled 2020-07-25: qty 2

## 2020-07-25 SURGICAL SUPPLY — 83 items
BAG DECANTER FOR FLEXI CONT (MISCELLANEOUS) IMPLANT
BASKET BONE COLLECTION (BASKET) ×4 IMPLANT
BENZOIN TINCTURE PRP APPL 2/3 (GAUZE/BANDAGES/DRESSINGS) ×4 IMPLANT
BLADE CLIPPER SURG (BLADE) IMPLANT
BLADE SURG 15 STRL LF DISP TIS (BLADE) ×2 IMPLANT
BLADE SURG 15 STRL SS (BLADE) ×2
BNDG ELASTIC 4X5.8 VLCR STR LF (GAUZE/BANDAGES/DRESSINGS) ×4 IMPLANT
BNDG GAUZE ELAST 4 BULKY (GAUZE/BANDAGES/DRESSINGS) ×4 IMPLANT
BONE CANC CHIPS 20CC PCAN1/4 (Bone Implant) ×4 IMPLANT
BUR CARBIDE MATCH 3.0 (BURR) ×4 IMPLANT
CABLE BIPOLOR RESECTION CORD (MISCELLANEOUS) ×4 IMPLANT
CANISTER SUCT 3000ML PPV (MISCELLANEOUS) ×8 IMPLANT
CHIPS CANC BONE 20CC PCAN1/4 (Bone Implant) ×2 IMPLANT
CLOSURE WOUND 1/2 X4 (GAUZE/BANDAGES/DRESSINGS) ×1
CNTNR URN SCR LID CUP LEK RST (MISCELLANEOUS) ×2 IMPLANT
CONT SPEC 4OZ STRL OR WHT (MISCELLANEOUS) ×2
COVER BACK TABLE 60X90IN (DRAPES) ×4 IMPLANT
COVER WAND RF STERILE (DRAPES) IMPLANT
DERMABOND ADVANCED (GAUZE/BANDAGES/DRESSINGS) ×2
DERMABOND ADVANCED .7 DNX12 (GAUZE/BANDAGES/DRESSINGS) ×2 IMPLANT
DIFFUSER DRILL AIR PNEUMATIC (MISCELLANEOUS) IMPLANT
DRAPE C-ARM 42X72 X-RAY (DRAPES) ×16 IMPLANT
DRAPE EXTREMITY T 121X128X90 (DISPOSABLE) ×4 IMPLANT
DRAPE HALF SHEET 40X57 (DRAPES) ×4 IMPLANT
DRAPE LAPAROTOMY 100X72X124 (DRAPES) ×4 IMPLANT
DRAPE SURG 17X23 STRL (DRAPES) ×4 IMPLANT
DRSG EMULSION OIL 3X3 NADH (GAUZE/BANDAGES/DRESSINGS) ×4 IMPLANT
DRSG OPSITE POSTOP 4X6 (GAUZE/BANDAGES/DRESSINGS) ×4 IMPLANT
DURAPREP 26ML APPLICATOR (WOUND CARE) ×8 IMPLANT
ELECT REM PT RETURN 9FT ADLT (ELECTROSURGICAL) ×8
ELECTRODE REM PT RTRN 9FT ADLT (ELECTROSURGICAL) ×4 IMPLANT
EVACUATOR 1/8 PVC DRAIN (DRAIN) IMPLANT
GAUZE 4X4 16PLY RFD (DISPOSABLE) ×4 IMPLANT
GAUZE SPONGE 4X4 12PLY STRL (GAUZE/BANDAGES/DRESSINGS) ×4 IMPLANT
GAUZE SPONGE 4X4 12PLY STRL LF (GAUZE/BANDAGES/DRESSINGS) ×4 IMPLANT
GLOVE BIO SURGEON STRL SZ7 (GLOVE) ×4 IMPLANT
GLOVE BIO SURGEON STRL SZ8 (GLOVE) ×12 IMPLANT
GLOVE BIOGEL PI IND STRL 7.0 (GLOVE) ×2 IMPLANT
GLOVE BIOGEL PI INDICATOR 7.0 (GLOVE) ×2
GOWN STRL REUS W/ TWL LRG LVL3 (GOWN DISPOSABLE) IMPLANT
GOWN STRL REUS W/ TWL XL LVL3 (GOWN DISPOSABLE) ×4 IMPLANT
GOWN STRL REUS W/TWL 2XL LVL3 (GOWN DISPOSABLE) ×4 IMPLANT
GOWN STRL REUS W/TWL LRG LVL3 (GOWN DISPOSABLE)
GOWN STRL REUS W/TWL XL LVL3 (GOWN DISPOSABLE) ×4
GRAFT BONE PROTEIOS MED 2.5CC (Orthopedic Implant) ×4 IMPLANT
HEMOSTAT POWDER KIT SURGIFOAM (HEMOSTASIS) ×4 IMPLANT
KIT BASIN OR (CUSTOM PROCEDURE TRAY) ×4 IMPLANT
KIT BONE MRW ASP ANGEL CPRP (KITS) ×4 IMPLANT
KIT TURNOVER KIT B (KITS) ×4 IMPLANT
MILL MEDIUM DISP (BLADE) ×4 IMPLANT
NEEDLE HYPO 18GX1.5 BLUNT FILL (NEEDLE) ×12 IMPLANT
NEEDLE HYPO 25X1 1.5 SAFETY (NEEDLE) ×8 IMPLANT
NS IRRIG 1000ML POUR BTL (IV SOLUTION) ×4 IMPLANT
PACK LAMINECTOMY NEURO (CUSTOM PROCEDURE TRAY) ×4 IMPLANT
PACK SURGICAL SETUP 50X90 (CUSTOM PROCEDURE TRAY) ×4 IMPLANT
PAD ARMBOARD 7.5X6 YLW CONV (MISCELLANEOUS) ×16 IMPLANT
ROD LORD LIPPED TI 5.5X35 (Rod) ×8 IMPLANT
SCREW CORT SHANK MOD 6.5X40 (Screw) ×16 IMPLANT
SCREW POLYAXIAL TULIP (Screw) ×16 IMPLANT
SET SCREW (Screw) ×8 IMPLANT
SET SCREW SPNE (Screw) ×8 IMPLANT
SPACER BATT PS 9X25X11 (Spacer) ×8 IMPLANT
SPONGE LAP 4X18 RFD (DISPOSABLE) IMPLANT
SPONGE SURGIFOAM ABS GEL 100 (HEMOSTASIS) ×4 IMPLANT
STOCKINETTE 4X48 STRL (DRAPES) IMPLANT
STOCKINETTE 6  STRL (DRAPES) ×2
STOCKINETTE 6 STRL (DRAPES) ×2 IMPLANT
STRIP CLOSURE SKIN 1/2X4 (GAUZE/BANDAGES/DRESSINGS) ×3 IMPLANT
SUT ETHILON 4 0 PS 2 18 (SUTURE) ×4 IMPLANT
SUT VIC AB 0 CT1 18XCR BRD8 (SUTURE) ×2 IMPLANT
SUT VIC AB 0 CT1 8-18 (SUTURE) ×2
SUT VIC AB 2-0 CP2 18 (SUTURE) ×4 IMPLANT
SUT VIC AB 3-0 SH 8-18 (SUTURE) ×12 IMPLANT
SYR 3ML LL SCALE MARK (SYRINGE) ×4 IMPLANT
SYR BULB EAR ULCER 3OZ GRN STR (SYRINGE) ×4 IMPLANT
SYR CONTROL 10ML LL (SYRINGE) ×12 IMPLANT
TOWEL GREEN STERILE (TOWEL DISPOSABLE) ×8 IMPLANT
TOWEL GREEN STERILE FF (TOWEL DISPOSABLE) ×8 IMPLANT
TRAY FOLEY MTR SLVR 16FR STAT (SET/KITS/TRAYS/PACK) ×4 IMPLANT
TUBE CONNECTING 12'X1/4 (SUCTIONS) ×1
TUBE CONNECTING 12X1/4 (SUCTIONS) ×3 IMPLANT
UNDERPAD 30X36 HEAVY ABSORB (UNDERPADS AND DIAPERS) ×4 IMPLANT
WATER STERILE IRR 1000ML POUR (IV SOLUTION) ×4 IMPLANT

## 2020-07-25 NOTE — Op Note (Signed)
07/25/2020  11:57 AM  PATIENT:  Carol [name]  74 y.o. female  PRE-OPERATIVE DIAGNOSIS:  1. Severe spinal stenosis L3-4 L4-5, 2. Spondylolisthesis L4-5, 3. Back pain and leg weakness with neurogenic claudication, 4. Bilateral carpal tunnel syndrome  POST-OPERATIVE DIAGNOSIS:  same  PROCEDURE:   1. Decompressive lumbar laminectomy L3-4 and L4-5 requiring more work than would be required for a simple exposure of the disk for PLIF in order to adequately decompress the neural elements and address the spinal stenosis, note that L3-4 is separate from the PLIF level 2. Posterior lumbar interbody fusion L4-5 using peek interbody cages packed with morcellized allograft and autograft soaked with a bone marrow aspirate obtained through a separate fascial incision over the right iliac crest 3. Posterior fixation L4-5 using Alphatec cortical pedicle screws.  4. Intertransverse arthrodesis L3-4 L4-5 on the right using morcellized autograft and allograft. 5. Right carpal tunnel release  SURGEON:  Sherley Bounds, MD  ASSISTANTS: Glenford Peers, FNP  ANESTHESIA:  General  EBL: 200 ml  Total I/O In: 2250 [I.V.:2000; IV Piggyback:250] Out: 400 [Urine:200; Blood:200]  BLOOD ADMINISTERED:none  DRAINS: none   INDICATION FOR PROCEDURE: This patient presented with bilateral hand numbness and back pain with leg pain and weakness in the legs. Imaging revealed spondylolisthesis with stenosis L4-5 and spinal stenosis at L3-4. Nerve conduction study showed bilateral severe median neuropathies.. The patient tried a reasonable attempt at conservative medical measures without relief. I recommended decompression and instrumented fusion to address the stenosis as well as the segmental  instability. Also recommended a right carpal tunnel release. patient understood the risks, benefits, and alternatives and potential outcomes and wished to proceed.  PROCEDURE DETAILS:  The patient was brought to the  operating room. After induction of generalized endotracheal anesthesia the patient positioned for right carpal tunnel release. The right arm was extended on an armboard and prepped circumferentially from the fingertips to the elbow with DuraPrep. 6 cc of local anesthesia injected and a palmar incision was made from the distal wrist crease into the palm in line with the webspace between the third and fourth digits. I dissected down through the palmar fascia to identify the transverse carpal ligament. The ligament was opened to expose the underlying median nerve. I then spread between the ligament and the nerve both proximally and distally with a mosquito and completely transected the ligament both proximally and distally until the nerve was free. I then palpated with a mosquito to assure that the ligament was transected and the nerve was free. Irrigated with saline solution. I dried any bleeding with bipolar cautery. I closed the palmar fascia with a single 3-0 Vicryl. I closed the subcutaneous tissues with 3-0 Vicryl. I closed the skin with interrupted 4-0 Ethilon vertical mattress sutures. I placed Adaptic over the wound and then wrapped the hand in a Kerlix and an Ace bandage. The end of this procedure all sponge needle and instrument counts were correct. We turned our attention to the posterior lumbar interbody fusion.   The patient was rolled into the prone position on chest rolls and all pressure points were padded. The patient's lumbar region was cleaned and then prepped with DuraPrep and draped in the usual sterile fashion. Anesthesia was injected and then a dorsal midline incision was made and carried down to the lumbosacral fascia. The fascia was opened and the paraspinous musculature was taken down in a subperiosteal fashion to expose L3-4 and L4-5. A self-retaining retractor was placed. Intraoperative fluoroscopy confirmed my level, and  I started with placement of the L4 cortical pedicle screws. The  pedicle screw entry zones were identified utilizing surface landmarks and  AP and lateral fluoroscopy. I scored the cortex with the high-speed drill and then used the hand drill to drill an upward and outward direction into the pedicle. I then tapped line to line. I then placed a 6.5 x 40 mm cortical pedicle screw into the pedicles of L4 bilaterally.  I then dissected in a suprafascial plane to expose the iliac crest.  Opened the fascia and we used a Jamshidi needle to extract 60 cc of bone marrow aspirate from the iliac crest with the assistance of my nurse practitioner.  This was then spun down by [facility] device and 2 to 4 cc of  BMAC was soaked on morselized allograft for later arthrodesis.  I dried the hole with Surgifoam and closed the fascia.  I then turned my attention to the decompression and complete lumbar laminectomies, hemi- facetectomies, and foraminotomies were performed at L3 4 and L4-5.  My nurse practitioner was directly involved in the decompression and exposure of the neural elements. the patient had significant spinal stenosis and this required more work than would be required for a simple exposure of the disc for posterior lumbar interbody fusion which would only require a limited laminotomy at L4-5. Much more generous decompression and generous foraminotomy was undertaken in order to adequately decompress the neural elements and address the patient's leg pain. The yellow ligament was removed to expose the underlying dura and nerve roots, and generous foraminotomies were performed to adequately decompress the neural elements. Both the exiting and traversing nerve roots were decompressed on both sides until a coronary dilator passed easily along the nerve roots. Once the decompression was complete, I turned my attention to the posterior lower lumbar interbody fusion at L4-5. The epidural venous vasculature was coagulated and cut sharply. Disc space was incised and the initial discectomy was  performed with pituitary rongeurs. The disc space was distracted with sequential distractors to a height of 11 mm. We then used a series of scrapers and shavers to prepare the endplates for fusion. The midline was prepared with Epstein curettes. Once the complete discectomy was finished, we packed an appropriate sized interbody cage with local autograft and morcellized allograft, gently retracted the nerve root, and tapped the cage into position at L4-5.  The midline between the cages was packed with morselized autograft and allograft. We then turned our attention to the placement of the lower pedicle screws. The pedicle screw entry zones were identified utilizing surface landmarks and fluoroscopy. I drilled into each pedicle utilizing the hand drill, and tapped each pedicle with the appropriate tap. We palpated with a ball probe to assure no break in the cortex. We then placed 6.5 x 40 mm cortical pedicle screws into the pedicles bilaterally at L5.  My nurse practitioner assisted in placement of the pedicle screws.  We then decorticated the transverse processes and laid a mixture of morcellized autograft and allograft out over these to perform intertransverse arthrodesis at L3-4 and L4-5 on the right. We then placed lordotic rods into the multiaxial screw heads of the pedicle screws and locked these in position with the locking caps and anti-torque device. We then checked our construct with AP and lateral fluoroscopy. Irrigated with copious amounts of bacitracin-containing saline solution. Inspected the nerve roots once again to assure adequate decompression, lined to the dura with Gelfoam, , and then we closed the muscle and the  fascia with 0 Vicryl. Closed the subcutaneous tissues with 2-0 Vicryl and subcuticular tissues with 3-0 Vicryl. The skin was closed with benzoin and Steri-Strips. Dressing was then applied, the patient was awakened from general anesthesia and transported to the recovery room in stable  condition. At the end of the procedure all sponge, needle and instrument counts were correct.   PLAN OF CARE: admit to inpatient  PATIENT DISPOSITION:  PACU - hemodynamically stable.   Delay start of Pharmacological VTE agent (>24hrs) due to surgical blood loss or risk of bleeding:  yes

## 2020-07-25 NOTE — Transfer of Care (Signed)
Immediate Anesthesia Transfer of Care Note  Patient: Carol [name]  Procedure(s) Performed: Posterior Lumbar Interbody Fusion - Lumbar Four-Lumbar Five, laminectomy Lumbar Three-Four,  Lumbar Four-Five (N/A Back) Carpal Tunnel Release - right (Right )  Patient Location: PACU  Anesthesia Type:General  Level of Consciousness: awake, alert  and oriented  Airway & Oxygen Therapy: Patient Spontanous Breathing  Post-op Assessment: Report given to RN and Post -op Vital signs reviewed and stable  Post vital signs: Reviewed and stable  Last Vitals:  Vitals Value Taken Time  BP    Temp    Pulse 84 07/25/20 1215  Resp 13 07/25/20 1215  SpO2 90 % 07/25/20 1215  Vitals shown include unvalidated device data.  Last Pain:  Vitals:   07/25/20 0604  TempSrc: Oral  PainSc: 5       Patients Stated Pain Goal: 4 (33/74/45 1460)  Complications: No complications documented.

## 2020-07-25 NOTE — Anesthesia Postprocedure Evaluation (Signed)
Anesthesia Post Note  Patient: Carol [name]  Procedure(s) Performed: Posterior Lumbar Interbody Fusion - Lumbar Four-Lumbar Five, laminectomy Lumbar Three-Four,  Lumbar Four-Five (N/A Back) Carpal Tunnel Release - right (Right )     Patient location during evaluation: PACU Anesthesia Type: General Level of consciousness: sedated Pain management: pain level controlled Vital Signs Assessment: post-procedure vital signs reviewed and stable Respiratory status: spontaneous breathing and respiratory function stable Cardiovascular status: stable Postop Assessment: no apparent nausea or vomiting Anesthetic complications: no   No complications documented.  Last Vitals:  Vitals:   07/25/20 1315 07/25/20 1330  BP: (!) 154/89 (!) 152/91  Pulse: 73 65  Resp: 10 14  Temp:    SpO2: 98% 100%    Last Pain:  Vitals:   07/25/20 1315  TempSrc:   PainSc: 5                  Ellarie Picking DANIEL

## 2020-07-25 NOTE — Anesthesia Procedure Notes (Signed)
Procedure Name: Intubation Date/Time: 07/25/2020 8:01 AM Performed by: Duane Boston, MD Pre-anesthesia Checklist: Patient identified, Emergency Drugs available, Suction available and Patient being monitored Patient Re-evaluated:Patient Re-evaluated prior to induction Oxygen Delivery Method: Circle system utilized Preoxygenation: Pre-oxygenation with 100% oxygen Induction Type: IV induction Ventilation: Mask ventilation without difficulty Laryngoscope Size: Miller and 2 Grade View: Grade I Tube type: Oral Tube size: 7.0 mm Number of attempts: 1 Airway Equipment and Method: Stylet and Oral airway Placement Confirmation: ETT inserted through vocal cords under direct vision,  positive ETCO2 and breath sounds checked- equal and bilateral Secured at: 21 cm Tube secured with: Tape Dental Injury: Teeth and Oropharynx as per pre-operative assessment

## 2020-07-25 NOTE — H&P (Signed)
Subjective: Patient is a 74 y.o. female admitted for back pain and hand numbness. Onset of symptoms was several months ago, gradually worsening since that time.  The pain is rated severe, unremitting, and is located at the across the lower back and radiates to RLE. C/o R hand numbness. The pain is described as aching and occurs all day. The symptoms have been progressive. Symptoms are exacerbated by exercise. MRI or CT showed stenosis with spondylolisthesis and EMGs showed R CTS   Past Medical History:  Diagnosis Date  . Allergic rhinitis, cause unspecified   . Arthritis    hands  . Breast cancer ([place]) 2019   8 mm ER/PR positive, HER-2/neu not overexpressed. Right breast- Oncotype score 14 (4% chance of recurrence on antiestrogen alone)  . Bronchitis    h/o  . Cataract   . Fibromyalgia   . HLD (hyperlipidemia)   . Hypothyroidism   . 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 Other abnormal glucose   . 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 Personal history of colonic polyps   . Personal history of radiation therapy 2019   f/u right breast cancer  . Pre-diabetes   . Presence of dental prosthetic device    implants - top and bottom  . Shingles   . Swelling of limb   . Thyroid goiter   . Tobacco use disorder   . Unspecified sinusitis (chronic)     Past Surgical History:  Procedure Laterality Date  . APPENDECTOMY  06/1997  . BREAST BIOPSY Right 11/05/2000    core with clip/neg  . BREAST BIOPSY Right 2011   benign, clip placed   . BREAST BIOPSY Right 12/02/2017   [place] and DCIS  . CATARACT EXTRACTION W/PHACO Left 07/30/2018   Procedure: CATARACT EXTRACTION PHACO AND INTRAOCULAR LENS PLACEMENT ([place]) LEFT;  Surgeon: Leandrew Koyanagi, MD;  Location: [place];  Service: Ophthalmology;  Laterality: Left;  . CATARACT EXTRACTION W/PHACO Right 08/27/2018   Procedure: CATARACT EXTRACTION PHACO AND INTRAOCULAR LENS PLACEMENT ([place])  RIGHT;   Surgeon: Leandrew Koyanagi, MD;  Location: [place];  Service: Ophthalmology;  Laterality: Right;  . COLONOSCOPY  10/15/2012   mutliple polyps.  Outlaw in [place].  Repeat 5 years.  . COLONOSCOPY W/ BIOPSIES  03/30/2004   Adenomatous polyp of the cecum, hyperplastic polyps of the splenic flexure, upper rectum and mid rectum.  . COLONOSCOPY WITH PROPOFOL N/A 03/19/2018   Tubular adenoma x2 without atypia.  Follow-up 2024.  Surgeon: Robert Bellow, MD;  Location: [facility] ENDOSCOPY;  Service: Endoscopy;  Laterality: N/A;  . COLPOSCOPY  10/16/2011   HGSIL pap smear.  Negative.  [facility] EXTERNAL EAR SURGERY  09/2008   external - not specified  (RIGHT)  . EYE SURGERY    . MASTECTOMY, PARTIAL Right 12/23/2017   T1b, N0; 8 mm ER: 100%; PR: 100%; Her 2 neu not overexpressed.  MammoSite placement.  Surgeon: Robert Bellow, MD;  Location: [facility] ORS;  Service: General;  Laterality: Right;  . SENTINEL NODE BIOPSY Right 12/23/2017   Procedure: SENTINEL NODE BIOPSY;  Surgeon: Robert Bellow, MD;  Location: [facility] ORS;  Service: General;  Laterality: Right;    Prior to Admission medications   Medication Sig Start Date End Date Taking? Authorizing Provider  Ascorbic Acid (VITAMIN C) 1000 MG tablet Take 1,000 mg by mouth daily.   Yes [provider]  aspirin 81 MG tablet Take 81 mg by mouth daily.    Yes  [provider]  atorvastatin (LIPITOR) 40 MG tablet Take 1 tablet (40 mg total) by mouth daily. 05/25/20  Yes Crecencio Mc, MD  cetirizine (ZYRTEC) 10 MG tablet Take 10 mg by mouth every morning.    Yes [provider]  Cholecalciferol (VITAMIN D3) 2000 UNITS capsule Take 2,000 Units by mouth daily.     Yes [provider]  Coenzyme Q10 (COQ-10) 200 MG CAPS Take 200 mg by mouth daily.   Yes [provider]  furosemide (LASIX) 20 MG tablet Take 1 tablet (20 mg total) by mouth 2 (two) times daily. 10/29/19  Yes Crecencio Mc, MD  Glucosamine Sulfate  1000 MG CAPS Take 1,000 mg by mouth daily.    Yes [provider]  HYDROcodone-acetaminophen (NORCO) 10-325 MG tablet Take 1 tablet by mouth every 6 (six) hours as needed. Patient taking differently: Take 1 tablet by mouth every 6 (six) hours as needed for moderate pain.  06/28/20  Yes Crecencio Mc, MD  letrozole [facility]) 2.5 MG tablet Take 1 tablet (2.5 mg total) by mouth daily. 10/29/19  Yes Crecencio Mc, MD  levothyroxine (SYNTHROID) 50 MCG tablet TAKE 1 TABLET EVERY DAY ON EMPTY STOMACHWITH A GLASS OF WATER AT LEAST 30-60 [place] BREAKFAST Patient taking differently: Take 50 mcg by mouth daily before breakfast. WITH A GLASS OF WATER AT LEAST 30-60 MINBEFORE BREAKFAST 05/03/20  Yes Crecencio Mc, MD  Misc Natural Products (OSTEO BI-FLEX JOINT SHIELD) TABS Take 1 tablet by mouth daily.    Yes [provider]  Olopatadine HCl (PATADAY OP) Place 1 drop into both eyes daily as needed (allergies).   Yes [provider]  phenazopyridine (PYRIDIUM) 200 MG tablet Take 1 tablet (200 mg total) by mouth 3 (three) times daily as needed for pain. 05/15/20  Yes Paulette Blanch, MD  Polyvinyl Alcohol-Povidone (REFRESH OP) Place 1 drop into both eyes daily as needed (dry eyes).   Yes [provider]  rOPINIRole (REQUIP) 0.5 MG tablet Take 1 tablet (0.5 mg total) by mouth at bedtime. 10/29/19  Yes Crecencio Mc, MD  senna (SENOKOT) 8.6 MG tablet Take 12 tablets by mouth at bedtime.    Yes [provider]  sulfamethoxazole-trimethoprim (BACTRIM DS) 800-160 MG tablet Take 1 tablet by mouth 2 (two) times daily. 07/06/20  Yes Crecencio Mc, MD  cephALEXin (KEFLEX) 500 MG capsule Take 1 capsule (500 mg total) by mouth 2 (two) times daily. Patient not taking: Reported on 07/07/2020 05/28/20   Crecencio Mc P, PA-C  NEOMYCIN-POLYMYXIN-HYDROCORTISONE (CORTISPORIN) 1 % SOLN OTIC solution Apply 1-2 drops to toe BID after soaking Patient not taking: Reported on 07/07/2020  03/02/20   Garrel Ridgel, DPM   No Known Allergies  Social History   Tobacco Use  . Smoking status: Former Smoker    Packs/day: 1.00    Years: 40.00    Pack years: 40.00    Types: Cigarettes    Quit date: 04/02/2003    Years since quitting: 17.3  . Smokeless tobacco: Never Used  . Tobacco comment: 7-10 cigarettes for past 40 years   Substance Use Topics  . Alcohol use: Yes    Comment: rarely    Family History  Problem Relation Age of Onset  . Heart failure Father   . Hypertension Father   . Stroke Father   . Hyperlipidemia Father   . Heart failure Brother   . Diabetes Brother   . Heart disease Brother   .  Diabetes Brother   . Heart disease Brother   . Hypertension Sister   . Depression Sister   . Hypertension Sister   . Asthma Sister   . Hyperlipidemia Sister   . Arthritis Other   . Coronary artery disease Other   . Hyperlipidemia Brother   . Hypertension Brother   . Breast cancer Maternal Aunt      Review of Systems  Positive ROS: neg  All other systems have been reviewed and were otherwise negative with the exception of those mentioned in the HPI and as above.  Objective: Vital signs in last 24 hours: Temp:  [98.5 F (36.9 C)] 98.5 F (36.9 C) (10/11 0604) Pulse Rate:  [87] 87 (10/11 0607) Resp:  [20] 20 (10/11 0607) BP: (146)/(66) 146/66 (10/11 0607) SpO2:  [96 %] 96 % (10/11 0607)  General Appearance: Alert, cooperative, no distress, appears stated age Head: Normocephalic, without obvious abnormality, atraumatic Eyes: PERRL, conjunctiva/corneas clear, EOM's intact    Neck: Supple, symmetrical, trachea midline Back: Symmetric, no curvature, ROM normal, no CVA tenderness Lungs:  respirations unlabored Heart: Regular rate and rhythm Abdomen: Soft, non-tender Extremities: Extremities normal, atraumatic, no cyanosis or edema Pulses: 2+ and symmetric all extremities Skin: Skin color, texture, turgor normal, no rashes or lesions  NEUROLOGIC:   Mental  status: Alert and oriented x4,  no aphasia, good attention span, fund of knowledge, and memory Motor Exam - grossly normal Sensory Exam - grossly normal Reflexes: 1+ Coordination - grossly normal Gait - grossly normal Balance - grossly normal Cranial Nerves: I: smell Not tested  II: visual acuity  OS: nl    OD: nl  II: visual fields Full to confrontation  II: pupils Equal, round, reactive to light  III,VII: ptosis None  III,IV,VI: extraocular muscles  Full ROM  V: mastication Normal  V: facial light touch sensation  Normal  V,VII: corneal reflex  Present  VII: facial muscle function - upper  Normal  VII: facial muscle function - lower Normal  VIII: hearing Not tested  IX: soft palate elevation  Normal  IX,X: gag reflex Present  XI: trapezius strength  5/5  XI: sternocleidomastoid strength 5/5  XI: neck flexion strength  5/5  XII: tongue strength  Normal    Data Review Lab Results  Component Value Date   WBC 7.4 07/19/2020   HGB 13.6 07/19/2020   HCT 41.2 07/19/2020   MCV 93.0 07/19/2020   PLT 214 07/19/2020   Lab Results  Component Value Date   NA 142 07/11/2020   K 4.3 07/11/2020   CL 102 07/11/2020   CO2 33 (H) 07/11/2020   BUN 9 07/11/2020   CREATININE 1.16 07/11/2020   GLUCOSE 113 (H) 07/11/2020   No results found for: INR, PROTIME  Assessment/Plan:  Estimated body mass index is 37.91 kg/m as calculated from the following:   Height as of 07/19/20: _0  (1.6 m).   Weight as of 07/19/20: 97.1 kg. Patient admitted for PLIF L4-5 and R CTR. Patient has failed a reasonable attempt at conservative therapy.  I explained the condition and procedure to the patient and answered any questions.  Patient wishes to proceed with procedure as planned. Understands risks/ benefits and typical outcomes of procedure.   Eustace Moore 07/25/2020 7:31 AM

## 2020-07-26 ENCOUNTER — Encounter (HOSPITAL_COMMUNITY): Payer: Self-pay | Admitting: Neurological Surgery

## 2020-07-26 ENCOUNTER — Other Ambulatory Visit: Payer: Medicare Other

## 2020-07-26 MED ORDER — TIZANIDINE HCL 2 MG PO TABS: 2.0000 mg | ORAL_TABLET | Freq: Four times a day (QID) | ORAL | 1 refills | Status: DC | PRN

## 2020-07-26 MED ORDER — OXYCODONE HCL 10 MG PO TABS
10.0000 mg | ORAL_TABLET | ORAL | 0 refills | Status: DC | PRN
Start: 2020-07-26 — End: 2020-08-31

## 2020-07-26 NOTE — Evaluation (Signed)
Occupational Therapy Evaluation Patient Details Name: Carol [name] MRN: [id number] DOB: 1945-12-10 Today's Date: 07/26/2020    History of Present Illness Patient is a 74 y/o female who presents s/p L4-5 PLIF and L3-4, L4-5 laminectomy and right carpal tunnel release 10/11.   Clinical Impression   This 74 y/o female presents with the above. PTA pt reports being mod independent with ADL and mobility tasks using RW. Pt pleasant and willing to participate in therapy session. She currently requires up to [facility] for sit<>stand to RW, completing room level mobility tasks using RW and minguard assist. Pt requiring up to minA for LB ADL and setup assist for UB ADL (including brace management) given current RUE limitations. Pt with reduced sensation in RUE but observed good ROM in digits. Pt with plans to return home with spouse who can assist with ADL/iADL PRN after discharge. She will benefit from continued acute OT services, has [facility] services in place which will be beneficial to maximize her safety and independence with ADL. Pt also may likely benefit from additional outpt OT services to further address RUE needs after completion of [place] therapies.     Follow Up Recommendations  Home health OT;Supervision/Assistance - 24 hour;Other (comment) (may benefit from outpt OT for R hand after [facility])    Equipment Recommendations  None recommended by OT (pt's DME needs are met )           Precautions / Restrictions Precautions Precautions: Fall;Back Precaution Booklet Issued: Yes (comment) Precaution Comments: issued and reviewed throughout session Restrictions Other Position/Activity Restrictions: no formal WB order for RUE, but educated pt to avoid WB through R hand      Mobility Bed Mobility Overal bed mobility: Needs Assistance Bed Mobility: Sidelying to Sit   Sidelying to sit: Min guard       General bed mobility comments: close guarding for safety, pt in sidelying upon arrival. pt  pushing through LUE and R elbow to transition to EOB with education on avoiding placing weight through R hand with transitions. performing from flat bed without handrail   Transfers Overall transfer level: Needs assistance Equipment used: Rolling walker (2 wheeled) Transfers: Sit to/from Stand Sit to Stand: Min assist         General transfer comment: VCs for safe hand placement, requires boosting assist to initiate stand from EOB, once initiated LEs with increased activation and pt able to boost herself to standing without significant assist. Able to stand from toilet with minguard assist     Balance Overall balance assessment: Needs assistance Sitting-balance support: Feet supported Sitting balance-Leahy Scale: Good     Standing balance support: Bilateral upper extremity supported;During functional activity Standing balance-Leahy Scale: Fair Standing balance comment: pt with improved stability with UE support on RW during mobility. She is able to stand at sink without UE support while washing hands                            ADL either performed or assessed with clinical judgement   ADL Overall ADL's : Needs assistance/impaired Eating/Feeding: Modified independent;Sitting   Grooming: Wash/dry hands;Supervision/safety;Standing   Upper Body Bathing: Sitting;Min guard   Lower Body Bathing: Minimal assistance;Sitting/lateral leans;Sit to/from stand Lower Body Bathing Details (indicate cue type and reason): educated in use of LH sponge for increased independence with task  Upper Body Dressing : Set up;Sitting Upper Body Dressing Details (indicate cue type and reason): setup for brace given decreased sensation in  RUE  Lower Body Dressing: Minimal assistance;Sit to/from stand Lower Body Dressing Details (indicate cue type and reason): pt able to perform modified figure 4, has slip on shoes. reports plans to wear overhead nightgowns initially  Toilet Transfer: Min  guard;Ambulation;Regular Toilet;Grab bars;RW Armed forces technical officer Details (indicate cue type and reason): pt resting RUE on RW with mobility  Toileting- Clothing Manipulation and Hygiene: Min guard;Sitting/lateral lean;Sit to/from Nurse, children's Details (indicate cue type and reason): educated in use of 3:1 as shower seat with pt/pt's spouse verbalizing understanding  Functional mobility during ADLs: Min Geophysical data processor     Praxis      Pertinent Vitals/Pain Pain Assessment: Faces Faces Pain Scale: Hurts even more Pain Location: back, intermittent Pain Descriptors / Indicators: Grimacing;Operative site guarding;Sharp Pain Intervention(s): Limited activity within patient's tolerance;Monitored during session;Repositioned;Premedicated before session     Hand Dominance Right   Extremity/Trunk Assessment Upper Extremity Assessment Upper Extremity Assessment: RUE deficits/detail RUE Deficits / Details: R hand ace wrapped/bandaged. pt able to move digits [facility] but with reduced sensation RUE Sensation: decreased light touch RUE Coordination: decreased fine motor   Lower Extremity Assessment Lower Extremity Assessment: Defer to PT evaluation   Cervical / Trunk Assessment Cervical / Trunk Assessment: Other exceptions Cervical / Trunk Exceptions: s/p spinal surgery   Communication Communication Communication: No difficulties   Cognition Arousal/Alertness: Awake/alert Behavior During Therapy: WFL for tasks assessed/performed Overall Cognitive Status: Within Functional Limits for tasks assessed                                     General Comments  back incision unobserved     Exercises     Shoulder Instructions      Home Living Family/patient expects to be discharged to:: Private residence Living Arrangements: Spouse/significant other Available Help at Discharge: Family Type of Home: House Home Access: Stairs  to enter Technical brewer of Steps: 2 to porch, 1 to enter home Entrance Stairs-Rails: None Home Layout: One level     Bathroom Shower/Tub: Occupational psychologist: Standard     Home Equipment: Environmental consultant - 2 wheels;Bedside commode;Hand held shower head          Prior Functioning/Environment Level of Independence: Independent with assistive device(s)        Comments: using RW, reports increased difficulty with mobility/ADL tasks leading up to surgery        OT Problem List: Decreased strength;Decreased range of motion;Decreased activity tolerance;Impaired balance (sitting and/or standing);Decreased knowledge of use of DME or AE;Decreased knowledge of precautions;Impaired UE functional use;Impaired sensation;Pain      OT Treatment/Interventions: Self-care/ADL training;Therapeutic exercise;Energy conservation;DME and/or AE instruction;Therapeutic activities;Patient/family education;Balance training;Splinting    OT Goals(Current goals can be found in the care plan section) Acute Rehab OT Goals Patient Stated Goal: home when able, wants to regain her independence  OT Goal Formulation: With patient Time For Goal Achievement: 08/09/20 Potential to Achieve Goals: Good  OT Frequency: Min 2X/week   Barriers to D/C:            Co-evaluation              AM-PAC OT "6 Clicks" Daily Activity     Outcome Measure Help from another person eating meals?: A Little Help from another person taking care of personal grooming?: A  Little Help from another person toileting, which includes using toliet, bedpan, or urinal?: A Little Help from another person bathing (including washing, rinsing, drying)?: A Little Help from another person to put on and taking off regular upper body clothing?: A Little Help from another person to put on and taking off regular lower body clothing?: A Little 6 Click Score: 18   End of Session Equipment Utilized During Treatment: Rolling  walker;Back brace Nurse Communication: Mobility status  Activity Tolerance: Patient tolerated treatment well Patient left: with call bell/phone within reach;with family/visitor present;Other (comment) (seated EOB)  OT Visit Diagnosis: Other abnormalities of gait and mobility (R26.89);Pain Pain - part of body:  (back)                Time: 1224-8250 OT Time Calculation (min): 26 min Charges:  OT General Charges $OT Visit: 1 Visit OT Evaluation $OT Eval Low Complexity: 1 Low OT Treatments $Self Care/Home Management : 8-22 mins  Lou Cal, OT Acute Rehabilitation Services Pager [phone] Office [phone]   Raymondo Band 07/26/2020, 9:44 AM

## 2020-07-26 NOTE — Discharge Summary (Signed)
Physician Discharge Summary  Patient ID: Rhodie Cienfuegos MRN: [id number] DOB/AGE: [birth year] 72 y.o.  Admit date: 07/25/2020 Discharge date: 07/26/2020  Admission Diagnoses: R CTS, spondylolisthesis with stenosis    Discharge Diagnoses: same   Discharged Condition: good  Hospital Course: The patient was admitted on 07/25/2020 and taken to the operating room where the patient underwent PLIF and R CTR. The patient tolerated the procedure well and was taken to the recovery room and then to the floor in stable condition. The hospital course was routine. There were no complications. The wound remained clean dry and intact. Pt had appropriate back soreness. No complaints of leg pain or new N/T/W. The patient remained afebrile with stable vital signs, and tolerated a regular diet. The patient continued to increase activities, and pain was well controlled with oral pain medications.   Consults: None  Significant Diagnostic Studies:  Results for orders placed or performed during the hospital encounter of 07/25/20  Glucose, capillary  Result Value Ref Range   Glucose-Capillary 136 (H) 70 - 99 mg/dL  ABO/Rh  Result Value Ref Range   ABO/RH(D)      O POS Performed at [facility] [address] [place], [place] [address]     Chest 2 View  Result Date: 07/19/2020 CLINICAL DATA:  Pre-op for spondylolisthesis. EXAM: CHEST - 2 VIEW COMPARISON:  Radiographs 08/27/2008.  CT 01/17/2016. FINDINGS: The heart size is at the upper limits of normal. The mediastinal contours are stable. The lungs are clear. There is no pleural effusion or pneumothorax. No acute osseous findings. Mild degenerative changes throughout the thoracic spine. IMPRESSION: No active cardiopulmonary process. Electronically Signed   By: Richardean Sale M.D.   On: 07/19/2020 16:39   DG Lumbar Spine 2-3 Views  Result Date: 07/25/2020 CLINICAL DATA:  Surgical posterior fusion of L4-5. EXAM: LUMBAR SPINE - 2-3 VIEW; DG  C-ARM 1-60 MIN Radiation exposure index: 43.963 mGy. COMPARISON:  None. FINDINGS: Two intraoperative fluoroscopic images were obtained of the lower lumbar spine. The patient is status post surgical posterior fusion of L4-5 with bilateral intrapedicular screw placement and interbody fusion. IMPRESSION: Status post surgical posterior fusion of L4-5. Electronically Signed   By: Marijo Conception M.D.   On: 07/25/2020 11:58   DG C-Arm 1-60 Min  Result Date: 07/25/2020 CLINICAL DATA:  Surgical posterior fusion of L4-5. EXAM: LUMBAR SPINE - 2-3 VIEW; DG C-ARM 1-60 MIN Radiation exposure index: 43.963 mGy. COMPARISON:  None. FINDINGS: Two intraoperative fluoroscopic images were obtained of the lower lumbar spine. The patient is status post surgical posterior fusion of L4-5 with bilateral intrapedicular screw placement and interbody fusion. IMPRESSION: Status post surgical posterior fusion of L4-5. Electronically Signed   By: Marijo Conception M.D.   On: 07/25/2020 11:58    Antibiotics:  Anti-infectives (From admission, onward)   Start     Dose/Rate Route Frequency Ordered Stop   07/25/20 1600  ceFAZolin (ANCEF) IVPB 2g/100 mL premix        2 g 200 mL/hr over 30 Minutes Intravenous Every 8 hours 07/25/20 1406 07/26/20 0042   07/25/20 0600  ceFAZolin (ANCEF) IVPB 2g/100 mL premix        2 g 200 mL/hr over 30 Minutes Intravenous On call to O.R. 07/25/20 0542 07/25/20 0805      Discharge Exam: Blood pressure (!) 106/47, pulse 66, temperature 98.3 F (36.8 C), temperature source Oral, resp. rate 16, SpO2 97 %. Neurologic: Grossly normal Dressing dry  Discharge Medications:  Allergies as of 07/26/2020   No Known Allergies     Medication List    STOP taking these medications   HYDROcodone-acetaminophen 10-325 MG tablet Commonly known as: NORCO     TAKE these medications   aspirin 81 MG tablet Take 81 mg by mouth daily.   atorvastatin 40 MG tablet Commonly known as: LIPITOR Take 1 tablet  (40 mg total) by mouth daily.   cetirizine 10 MG tablet Commonly known as: ZYRTEC Take 10 mg by mouth every morning.   CoQ-10 200 MG Caps Take 200 mg by mouth daily.   furosemide 20 MG tablet Commonly known as: LASIX Take 1 tablet (20 mg total) by mouth 2 (two) times daily.   Glucosamine Sulfate 1000 MG Caps Take 1,000 mg by mouth daily.   letrozole 2.5 MG tablet Commonly known as: FEMARA Take 1 tablet (2.5 mg total) by mouth daily.   levothyroxine 50 MCG tablet Commonly known as: SYNTHROID TAKE 1 TABLET EVERY DAY ON EMPTY STOMACHWITH A GLASS OF WATER AT LEAST 30-60 MINBEFORE BREAKFAST What changed:   how much to take  how to take this  when to take this  additional instructions   NEOMYCIN-POLYMYXIN-HYDROCORTISONE 1 % Soln OTIC solution Commonly known as: CORTISPORIN Apply 1-2 drops to toe BID after soaking   Osteo Bi-Flex Joint Shield Tabs Take 1 tablet by mouth daily.   Oxycodone HCl 10 MG Tabs Take 1 tablet (10 mg total) by mouth every 4 (four) hours as needed for severe pain ((score 7 to 10)).   PATADAY OP Place 1 drop into both eyes daily as needed (allergies).   phenazopyridine 200 MG tablet Commonly known as: Pyridium Take 1 tablet (200 mg total) by mouth 3 (three) times daily as needed for pain.   REFRESH OP Place 1 drop into both eyes daily as needed (dry eyes).   rOPINIRole 0.5 MG tablet Commonly known as: REQUIP Take 1 tablet (0.5 mg total) by mouth at bedtime.   senna 8.6 MG tablet Commonly known as: SENOKOT Take 12 tablets by mouth at bedtime.   tiZANidine 2 MG tablet Commonly known as: ZANAFLEX Take 1 tablet (2 mg total) by mouth every 6 (six) hours as needed for muscle spasms.   vitamin C 1000 MG tablet Take 1,000 mg by mouth daily.   Vitamin D3 50 MCG (2000 UT) capsule Take 2,000 Units by mouth daily.            Durable Medical Equipment  (From admission, onward)         Start     Ordered   07/26/20 0820  DME Gilford Rile   Once       Question Answer Comment  Walker: With [place] Wheels   Patient needs a walker to treat with the following condition Gait instability      07/26/20 0820   07/25/20 1406  DME Walker rolling  Once       Question:  Patient needs a walker to treat with the following condition  Answer:  S/P lumbar fusion   07/25/20 1406   07/25/20 1406  DME 3 n 1  Once        07/25/20 1406          Disposition:home   Final Dx: PLIF L4-5, DLL L3-4, R CTR  Discharge Instructions     Remove dressing in 72 hours   Complete by: As directed    Call MD for:  persistant nausea and vomiting   Complete by: As  directed    Call MD for:  redness, tenderness, or signs of infection (pain, swelling, redness, odor or green/yellow discharge around incision site)   Complete by: As directed    Call MD for:  severe uncontrolled pain   Complete by: As directed    Call MD for:  temperature >100.4   Complete by: As directed    Diet - low sodium heart healthy   Complete by: As directed    Increase activity slowly   Complete by: As directed          Signed: Eustace Moore 07/26/2020, 8:20 AM

## 2020-07-26 NOTE — TOC Initial Note (Addendum)
Transition of Care [facility]) - Initial/Assessment Note    Patient Details  Name: Carol [name] MRN: [id number] Date of Birth: 1945-12-23  [facility] of [facility]) CM/SW Contact:    Angelita Ingles, RN Phone Number: [phone]  07/26/2020, 9:36 AM  Clinical Narrative:      CM consulted for Home health. Patient states that she lives at home with husband and has had PT in the past. Patient has been independent. Choice has been offered to the patient. CM will follow up with Home health agency to set up services.        [place] notified. Acceptance of patient for home health services pending.        [place] has been set up with [facility]. Spoke with Amy. Agency will contact patient for start of service date.    Expected Discharge Plan: [place] Barriers to Discharge: No Barriers Identified   Patient Goals and CMS Choice Patient states their goals for this hospitalization and ongoing recovery are:: Patient states she is ready to go home CMS Medicare.gov Compare Post Acute Care list provided to:: Patient Choice offered to / list presented to : Patient  Expected Discharge Plan and Services Expected Discharge Plan: [place] In-house Referral: NA Discharge Planning Services: CM Consult Post Acute Care Choice: [place] arrangements for the past 2 months: Single Family Home Expected Discharge Date: 07/26/20               DME Arranged: N/A (provided by the unit through [facility]) DME Agency: [employer]                  Prior Living Arrangements/Services Living arrangements for the past 2 months: [place] Lives with:: Spouse Patient language and need for interpreter reviewed:: Yes Do you feel safe going back to the place where you live?: Yes      Need for Family Participation in Patient Care: Yes (Comment) Care giver support system in place?: Yes (comment) Current home  services: Other (comment) (none) Criminal Activity/Legal Involvement Pertinent to Current Situation/Hospitalization: No - Comment as needed  Activities of Daily Living      Permission Sought/Granted   Permission granted to share information with : No              Emotional Assessment Appearance:: Appears stated age Attitude/Demeanor/Rapport: Gracious Affect (typically observed): Calm, Pleasant Orientation: : Oriented to Self, Oriented to Place, Oriented to  Time, Oriented to Situation Alcohol / Substance Use: Not Applicable Psych Involvement: No (comment)  Admission diagnosis:  S/P lumbar fusion [Z98.1] Patient Active Problem List   Diagnosis Date Noted  . S/P lumbar fusion 07/25/2020  . Coronary atherosclerosis due to calcified coronary lesion 05/25/2020  . Abdominal aortic atherosclerosis ([place]) 05/25/2020  . Asymptomatic gallstones 05/25/2020  . Spinal stenosis at L4-L5 level 05/25/2020  . Constipation due to opioid therapy 05/25/2020  . Hemorrhagic cystitis 05/25/2020  . Acute paronychia of toe, left 04/03/2020  . Abnormal mammogram of right breast 12/29/2019  . Type 2 diabetes mellitus without complication, without long-term current use of insulin ([place]) 12/28/2019  . At high risk for fracture 11/20/2018  . Tubular adenoma of colon 10/04/2018  . Insomnia due to anxiety and fear 02/22/2018  . Osteopenia 02/13/2018  . Malignant neoplasm of upper-outer quadrant of right female breast ([place]) 12/04/2017  . Hypothyroidism (acquired) 10/01/2017  . Post herpetic neuralgia 09/30/2017  . History of colonic  polyps 03/06/2017  . Obesity 01/17/2016  . Allergic rhinitis due to pollen 06/15/2015  . Restless leg syndrome 06/15/2015  . Fibromyalgia 05/06/2015  . Encounter for general adult medical examination with abnormal findings 10/06/2012  . Venous stasis 10/06/2012  . Hyperlipidemia associated with type 2 diabetes mellitus ([place]) 09/05/2010  . DYSPNEA 09/05/2010   PCP:   Crecencio Mc, MD Pharmacy:   [place], [place] [address] [place] [address] Phone: [phone] Fax: [phone]     Social Determinants of Health (SDOH) Interventions    Readmission Risk Interventions No flowsheet data found.

## 2020-07-26 NOTE — Evaluation (Signed)
Physical Therapy Evaluation Patient Details Name: Carol [name] MRN: [id number] DOB: 05-Jan-1946 Today's Date: 07/26/2020   History of Present Illness  Patient is a 74 y/o female who presents s/p L4-5 PLIF and L3-4, L4-5 laminectomy and right carpal tunnel release 10/11. PMH includes breast ca, HLD, fibromyalgia, Meniere's disease.  Clinical Impression  Patient presents with pain and post surgical deficits s/p above surgery. Pt reports having difficulty with ADLs and walking PTA, reports using RW for ambulation. Today, pt requires Min A for transfers and Min guard assist for gait training with use of RW for support. Pt asking to not use DME however recommend using RW for overall safety. Tolerated stair training with Min A (supporting right forearm) for safety. Family training provided with spouse present to safely assist pt into home. Education re: back precautions, brace, log roll technique, positioning, walking program etc. Will follow acutely to maximize independence and mobility prior to return home.    Follow Up Recommendations Follow surgeon's recommendation for DC plan and follow-up therapies;Supervision for mobility/OOB    Equipment Recommendations  None recommended by PT    Recommendations for Other Services       Precautions / Restrictions Precautions Precautions: Fall Precaution Booklet Issued: Yes (comment) Precaution Comments: Reviewed back precautions and handout Required Braces or Orthoses: Spinal Brace Spinal Brace: Lumbar corset;Applied in standing position Restrictions Weight Bearing Restrictions: No Other Position/Activity Restrictions: no formal WB order for RUE, but educated pt to avoid WB through R hand      Mobility  Bed Mobility Overal bed mobility: Needs Assistance Bed Mobility: Sidelying to Sit   Sidelying to sit: Min guard       General bed mobility comments: Sitting on EOB upon PT arrival.  Transfers Overall transfer level: Needs  assistance Equipment used: Rolling walker (2 wheeled) Transfers: Sit to/from Stand Sit to Stand: Min assist;Min guard         General transfer comment: VCs for safe hand placement, requires boosting assist to initiate stand from EOB, once initiated LEs with increased activation and pt able to boost herself to standing without significant assist. Stood from EOB x2.  Ambulation/Gait Ambulation/Gait assistance: Min guard Gait Distance (Feet): 150 Feet Assistive device: Rolling walker (2 wheeled) Gait Pattern/deviations: Step-through pattern;Decreased stride length Gait velocity: decreased   General Gait Details: Slow, mostly steady gait with RW for support; reports legs are tired from stairs. Resting right hand on walker handle.  Stairs Stairs: Yes Stairs assistance: Min assist Stair Management: Step to pattern;One rail Left Number of Stairs: 4 General stair comments: Cues for technique/safety. Performed stair training with husband present to assist supporting right forearm to ascend stairs.  Wheelchair Mobility    Modified Rankin (Stroke Patients Only)       Balance Overall balance assessment: Needs assistance Sitting-balance support: Feet supported;No upper extremity supported Sitting balance-Leahy Scale: Good     Standing balance support: During functional activity Standing balance-Leahy Scale: Fair Standing balance comment: Requires UE support for dynamic tasks.                             Pertinent Vitals/Pain Pain Assessment: Faces Faces Pain Scale: Hurts even more Pain Location: back, intermittent Pain Descriptors / Indicators: Grimacing;Operative site guarding;Sharp Pain Intervention(s): Monitored during session;Repositioned;Limited activity within patient's tolerance    Home Living Family/patient expects to be discharged to:: Private residence Living Arrangements: Spouse/significant other Available Help at Discharge: Family;Available 24  hours/day Type of Home:  House Home Access: Stairs to enter Entrance Stairs-Rails: None Entrance Stairs-Number of Steps: 2 to porch, 1 to enter home Home Layout: One level Home Equipment: Environmental consultant - 2 wheels;Bedside commode;Hand held shower head      Prior Function Level of Independence: Independent with assistive device(s)         Comments: using RW, reports increased difficulty with mobility/ADL tasks leading up to surgery.     Hand Dominance   Dominant Hand: Right    Extremity/Trunk Assessment   Upper Extremity Assessment Upper Extremity Assessment: Defer to OT evaluation RUE Deficits / Details: R hand ace wrapped/bandaged. pt able to move digits [facility] but with reduced sensation RUE Sensation: decreased light touch RUE Coordination: decreased fine motor    Lower Extremity Assessment Lower Extremity Assessment: Overall WFL for tasks assessed    Cervical / Trunk Assessment Cervical / Trunk Assessment: Other exceptions Cervical / Trunk Exceptions: s/p spinal surgery  Communication   Communication: No difficulties  Cognition Arousal/Alertness: Awake/alert Behavior During Therapy: WFL for tasks assessed/performed Overall Cognitive Status: Within Functional Limits for tasks assessed                                 General Comments: Pt very emotional during session, "i hope it is not too much for him" talking about her husband.      General Comments General comments (skin integrity, edema, etc.): Spouse present during session.    Exercises     Assessment/Plan    PT Assessment Patient needs continued PT services  PT Problem List Decreased mobility;Decreased knowledge of precautions;Pain;Decreased skin integrity;Decreased activity tolerance       PT Treatment Interventions Therapeutic activities;Gait training;Therapeutic exercise;Patient/family education;Functional mobility training;Balance training;Stair training    PT Goals (Current goals can be  found in the Care Plan section)  Acute Rehab PT Goals Patient Stated Goal: home when able, wants to regain her independence  PT Goal Formulation: With patient Time For Goal Achievement: 08/09/20 Potential to Achieve Goals: Good    Frequency Min 5X/week   Barriers to discharge Inaccessible home environment stairs    Co-evaluation               AM-PAC PT "6 Clicks" Mobility  Outcome Measure Help needed turning from your back to your side while in a flat bed without using bedrails?: A Little Help needed moving from lying on your back to sitting on the side of a flat bed without using bedrails?: A Little Help needed moving to and from a bed to a chair (including a wheelchair)?: A Little Help needed standing up from a chair using your arms (e.g., wheelchair or bedside chair)?: A Little Help needed to walk in hospital room?: A Little Help needed climbing 3-5 steps with a railing? : A Little 6 Click Score: 18    End of Session Equipment Utilized During Treatment: Back brace;Gait belt Activity Tolerance: Patient tolerated treatment well Patient left: in bed;with call bell/phone within reach;with family/visitor present;Other (comment) (sitting EOB with spouse present) Nurse Communication: Mobility status PT Visit Diagnosis: Pain;Difficulty in walking, not elsewhere classified (R26.2) Pain - part of body:  (back)    Time: 3532-9924 PT Time Calculation (min) (ACUTE ONLY): 16 min   Charges:   PT Evaluation $PT Eval Moderate Complexity: 1 Mod          Marisa Severin, PT, DPT Acute Rehabilitation Services Pager [phone] Office [phone]      Carol [name]  A Trey Bebee 07/26/2020, 9:53 AM

## 2020-07-26 NOTE — TOC Transition Note (Signed)
Transition of Care [facility]) - CM/SW Discharge Note   Patient Details  Name: Carol [name] MRN: [id number] Date of Birth: [birth year]  Transition of Care [facility] CM/SW Contact:  Angelita Ingles, RN Phone Number: [phone]  07/26/2020, 2:18 PM   Clinical Narrative:    CM consulted to set up [place] services. Home health choices offered. Home health has been set up with [facility]. Agency will notify patient with start of service date. No further needs noted at this time. CM will sign off.     Final next level of care: [place] Barriers to Discharge: No Barriers Identified   Patient Goals and CMS Choice Patient states their goals for this hospitalization and ongoing recovery are:: Patient is ready to be discharged. CMS Medicare.gov Compare Post Acute Care list provided to:: Patient Choice offered to / list presented to : Patient  Discharge Placement                       Discharge Plan and Services In-house Referral: NA Discharge Planning Services: CM Consult Post Acute Care Choice: Home Health          DME Arranged: N/A DME Agency: [facility]       HH Arranged: PT, OT HH Agency: [facility] Date [place]: 07/26/20 Time [place]: 3559 Representative spoke with at [place]: Amy  Social Determinants of Health ([place]) Interventions     Readmission Risk Interventions No flowsheet data found.

## 2020-07-26 NOTE — Plan of Care (Signed)
Pt doing well. Pt and husband given D/C instructions with verbal understanding. Pt's incisions are clean and dry with no sign of infection. Pt's IV was removed prior to D/C. Home Health was arranged by TOC prior to D/C. Pt D/C'd home via wheelchair per MD order. Pt is stable @ D/C and has no other needs at this time. Holli Humbles, RN

## 2020-08-04 ENCOUNTER — Telehealth: Payer: Self-pay | Admitting: Internal Medicine

## 2020-08-04 MED ORDER — SERTRALINE HCL 50 MG PO TABS: 25.0000 mg | ORAL_TABLET | Freq: Every day | ORAL | 3 refills | Status: DC

## 2020-08-04 NOTE — Telephone Encounter (Signed)
Sertraline (generic zoloft) ,  Start with 1/2 tablet daily with breakfast for one week,  Then increase to a full tablet thereafter.  Make appt for next week

## 2020-08-04 NOTE — Telephone Encounter (Signed)
Pt called and said that since she had surgery she has been having anxiety  And wanted something called in

## 2020-08-04 NOTE — Telephone Encounter (Signed)
No appt available until next week.

## 2020-08-05 NOTE — Telephone Encounter (Signed)
Spoke with pt to let her know the medication that was sent in and the directions. Pt asked if the percocet and the tizanidine that she was prescribed after surgery could be what is causing her anxiety. Pt is scheduled for a follow up next week.

## 2020-08-05 NOTE — Telephone Encounter (Signed)
Spoke with pt and she stated that she thought that could be what it was so she stated that she has stopped taking it because she really wasn't even in that much pain anymore and she seems to feel better already. She also stated that the zoloft has seemed to have calmed her down.

## 2020-08-05 NOTE — Telephone Encounter (Signed)
It could ,  Since they are new meds for her.

## 2020-08-05 NOTE — Telephone Encounter (Signed)
Left message with husband to have pt give [place] a call back.

## 2020-08-10 ENCOUNTER — Ambulatory Visit: Payer: Medicare Other | Admitting: Internal Medicine

## 2020-08-10 ENCOUNTER — Encounter: Payer: Self-pay | Admitting: Internal Medicine

## 2020-08-10 ENCOUNTER — Other Ambulatory Visit: Payer: Self-pay

## 2020-08-10 DIAGNOSIS — F411 Generalized anxiety disorder: Secondary | ICD-10-CM

## 2020-08-10 DIAGNOSIS — Z981 Arthrodesis status: Secondary | ICD-10-CM

## 2020-08-10 DIAGNOSIS — Z9889 Other specified postprocedural states: Secondary | ICD-10-CM | POA: Diagnosis not present

## 2020-08-10 NOTE — Progress Notes (Signed)
Subjective:  Patient ID: Carol [name], female    DOB: 1945-12-01  Age: 74 y.o. MRN: [id number]  CC: Diagnoses of S/P lumbar fusion, Generalized anxiety disorder, and S/P carpal tunnel release were pertinent to this visit.  HPI Carol [name] presents for follow up on initiation of sertraline for uncontrolled anxiety following her lumbar spinal fusion surgery  This visit occurred during the SARS-CoV-2 public health emergency.  Safety protocols were in place, including screening questions prior to the visit, additional usage of staff PPE, and extensive cleaning of exam room while observing appropriate contact time as indicated for disinfecting solutions.   Patient has received both doses of the available COVID 19 vaccine without complications.  Patient continues to mask when outside of the home except when walking in yard or at safe distance  HPI:  Admitted to [facility] 07/[facility]/2020 for elective PLIF and R CTR.   Discharged on Oct 12 with oxycodone and tizanidine for post operative pain management. Did not tolerate the change in opioids ,  Felt increasingly agitated and called my office requesting medication for anxiety.  Prescribed sertraline.  Has felt better for the last 2 days, sleeping well finally.   Has resumed used of hydrocodone  For pain management   She is taking 1/2  To 1 tablet  Two times daily most days and has finished post op PT .     Not taking femara more than every other day due to hot flashes   Weight loss ,  Decreased appetite   Bowels moving regularly  And taking lasix twice daily   Outpatient Medications Prior to Visit  Medication Sig Dispense Refill  . aspirin 81 MG tablet Take 81 mg by mouth daily.     Marland Kitchen atorvastatin (LIPITOR) 40 MG tablet Take 1 tablet (40 mg total) by mouth daily. 90 tablet 1  . cetirizine (ZYRTEC) 10 MG tablet Take 10 mg by mouth every morning.     . Coenzyme Q10 (COQ-10) 200 MG CAPS Take 200 mg by mouth daily.    . furosemide  (LASIX) 20 MG tablet Take 1 tablet (20 mg total) by mouth 2 (two) times daily. 180 tablet 3  . Glucosamine Sulfate 1000 MG CAPS Take 1,000 mg by mouth daily.     Marland Kitchen letrozole (FEMARA) 2.5 MG tablet Take 1 tablet (2.5 mg total) by mouth daily. 90 tablet 3  . levothyroxine (SYNTHROID) 50 MCG tablet TAKE 1 TABLET EVERY DAY ON EMPTY STOMACHWITH A GLASS OF WATER AT LEAST 30-60 MINBEFORE BREAKFAST (Patient taking differently: Take 50 mcg by mouth daily before breakfast. WITH A GLASS OF WATER AT LEAST 30-60 MINBEFORE BREAKFAST) 90 tablet 0  . Misc Natural Products (OSTEO BI-FLEX JOINT SHIELD) TABS Take 1 tablet by mouth daily.     . NEOMYCIN-POLYMYXIN-HYDROCORTISONE (CORTISPORIN) 1 % SOLN OTIC solution Apply 1-2 drops to toe BID after soaking 10 mL 1  . Olopatadine HCl (PATADAY OP) Place 1 drop into both eyes daily as needed (allergies).    Marland Kitchen oxyCODONE 10 MG TABS Take 1 tablet (10 mg total) by mouth every 4 (four) hours as needed for severe pain ((score 7 to 10)). 40 tablet 0  . phenazopyridine (PYRIDIUM) 200 MG tablet Take 1 tablet (200 mg total) by mouth 3 (three) times daily as needed for pain. 6 tablet 0  . Polyvinyl Alcohol-Povidone (REFRESH OP) Place 1 drop into both eyes daily as needed (dry eyes).    Marland Kitchen rOPINIRole (REQUIP) 0.5 MG tablet Take 1 tablet (  0.5 mg total) by mouth at bedtime. 90 tablet 3  . senna (SENOKOT) 8.6 MG tablet Take 12 tablets by mouth at bedtime.     . sertraline (ZOLOFT) 50 MG tablet Take 0.5 tablets (25 mg total) by mouth daily. For one week, with food.  Increase to full tablet after one week 30 tablet 3  . tiZANidine (ZANAFLEX) 2 MG tablet Take 1 tablet (2 mg total) by mouth every 6 (six) hours as needed for muscle spasms. 60 tablet 1  . Ascorbic Acid (VITAMIN C) 1000 MG tablet Take 1,000 mg by mouth daily. (Patient not taking: Reported on 08/10/2020)    . Cholecalciferol (VITAMIN D3) 2000 UNITS capsule Take 2,000 Units by mouth daily.   (Patient not taking: Reported on  08/10/2020)     No facility-administered medications prior to visit.    Review of Systems;  Patient denies headache, fevers, malaise, unintentional weight loss, skin rash, eye pain, sinus congestion and sinus pain, sore throat, dysphagia,  hemoptysis , cough, dyspnea, wheezing, chest pain, palpitations, orthopnea, edema, abdominal pain, nausea, melena, diarrhea, constipation, flank pain, dysuria, hematuria, urinary  Frequency, nocturia, numbness, tingling, seizures,  Focal weakness, Loss of consciousness,  Tremor, insomnia, depression, anxiety, and suicidal ideation.      Objective:  BP 134/76 (BP Location: Left Arm, Patient Position: Sitting, Cuff Size: Large)   Pulse 84   Temp 98.7 F (37.1 C) (Oral)   Resp 15   Ht 5\' 3"  (1.6 m)   Wt 205 lb 6.4 oz (93.2 kg)   SpO2 96%   BMI 36.38 kg/m   BP Readings from Last 3 Encounters:  08/10/20 134/76  07/26/20 (!) 106/47  07/19/20 138/61    Wt Readings from Last 3 Encounters:  08/10/20 205 lb 6.4 oz (93.2 kg)  07/19/20 214 lb (97.1 kg)  07/06/20 216 lb 6.4 oz (98.2 kg)    General appearance: alert, cooperative and appears stated age Ears: normal TM's and external ear canals both ears Throat: lips, mucosa, and tongue normal; teeth and gums normal Neck: no adenopathy, no carotid bruit, supple, symmetrical, trachea midline and thyroid not enlarged, symmetric, no tenderness/mass/nodules Back: symmetric, no curvature. ROM normal. No CVA tenderness. Lungs: clear to auscultation bilaterally Heart: regular rate and rhythm, S1, S2 normal, no murmur, click, rub or gallop skin: right wrist surgical wound slightly macerated. Lumbar spine  Wound looks fine.  Abdomen: soft, non-tender; bowel sounds normal; no masses,  no organomegaly Pulses: 2+ and symmetric Lymph nodes: Cervical, supraclavicular, and axillary nodes normal.  Lab Results  Component Value Date   HGBA1C 5.8 07/11/2020   HGBA1C 6.7 (A) 03/31/2020   HGBA1C 6.6 (H) 12/25/2019     Lab Results  Component Value Date   CREATININE 1.16 07/11/2020   CREATININE 1.03 (H) 05/15/2020   CREATININE 0.88 12/25/2019    Lab Results  Component Value Date   WBC 7.4 07/19/2020   HGB 13.6 07/19/2020   HCT 41.2 07/19/2020   PLT 214 07/19/2020   GLUCOSE 113 (H) 07/11/2020   CHOL 122 07/11/2020   TRIG 126.0 07/11/2020   HDL 49.40 07/11/2020   LDLCALC 47 07/11/2020   ALT 15 07/11/2020   AST 16 07/11/2020   NA 142 07/11/2020   K 4.3 07/11/2020   CL 102 07/11/2020   CREATININE 1.16 07/11/2020   BUN 9 07/11/2020   CO2 33 (H) 07/11/2020   TSH 2.08 12/25/2019   HGBA1C 5.8 07/11/2020   MICROALBUR 1.2 07/05/2020    No results found.  Assessment & Plan:   Problem List Items Addressed This Visit      Unprioritized   S/P lumbar fusion    S.p PLIF On Oct 11 with good results.  Sciatica resolved.  She is at> 2 weeks post operative Recommend increasing tylenol to 2000 mg dialy and start weaning hydrocodone to off       Generalized anxiety disorder    Initially her symptoms were aggravated by medication side effect (oxycodone and tizanidine) but she notes improved mood on sertraline.  Will continue medication for now       S/P carpal tunnel release    Wound looks macerated.  Wound care addressed ,  She has not been covering it during showers.          I provided  30 minutes of  face-to-face time during this encounter reviewing patient's current problems and past surgeries, labs and imaging studies, providing counseling on the above mentioned problems , and coordination  of care .   I have discontinued Carol [name]'s tiZANidine. I am also having her maintain her Glucosamine Sulfate, Osteo Bi-Flex Joint Shield, Vitamin D3, aspirin, cetirizine, vitamin C, CoQ-10, senna, Olopatadine HCl (PATADAY OP), Polyvinyl Alcohol-Povidone (REFRESH OP), letrozole, rOPINIRole, furosemide, NEOMYCIN-POLYMYXIN-HYDROCORTISONE, levothyroxine, phenazopyridine, atorvastatin, Oxycodone  HCl, and sertraline.  No orders of the defined types were placed in this encounter.   Medications Discontinued During This Encounter  Medication Reason  . tiZANidine (ZANAFLEX) 2 MG tablet     Follow-up: Return in about 3 months (around 11/10/2020).   Crecencio Mc, MD

## 2020-08-10 NOTE — Patient Instructions (Addendum)
Your wrist wound looks a little soggy,  So change the bandage whenever it gets wet, and Wear a plastic bag over your wrist during showers  until the wound has completely healed    Increase your zoloft to a full tablet with a decent meal (dinner)   For your pain management:  I would schedule up to 1000 mg tylenol every 12 hours   And limit  Use of the  hydrocodone  To "as needed "

## 2020-08-13 DIAGNOSIS — Z9889 Other specified postprocedural states: Secondary | ICD-10-CM | POA: Insufficient documentation

## 2020-08-13 DIAGNOSIS — F411 Generalized anxiety disorder: Secondary | ICD-10-CM | POA: Insufficient documentation

## 2020-08-13 NOTE — Assessment & Plan Note (Signed)
S.p PLIF On Oct 11 with good results.  Sciatica resolved.  She is at> 2 weeks post operative Recommend increasing tylenol to 2000 mg dialy and start weaning hydrocodone to off

## 2020-08-13 NOTE — Assessment & Plan Note (Signed)
Initially her symptoms were aggravated by medication side effect (oxycodone and tizanidine) but she notes improved mood on sertraline.  Will continue medication for now

## 2020-08-13 NOTE — Assessment & Plan Note (Signed)
Wound looks macerated.  Wound care addressed ,  She has not been covering it during showers.

## 2020-08-17 ENCOUNTER — Other Ambulatory Visit: Payer: Self-pay | Admitting: Internal Medicine

## 2020-08-30 ENCOUNTER — Other Ambulatory Visit: Payer: Self-pay | Admitting: Internal Medicine

## 2020-08-31 ENCOUNTER — Encounter: Payer: Self-pay | Admitting: Radiation Oncology

## 2020-09-01 ENCOUNTER — Encounter: Payer: Self-pay | Admitting: Radiation Oncology

## 2020-09-01 ENCOUNTER — Ambulatory Visit
Admission: RE | Admit: 2020-09-01 | Discharge: 2020-09-01 | Disposition: A | Discharge status: Home / Self Care | Payer: Medicare Other | Source: Ambulatory Visit | Attending: Radiation Oncology | Admitting: Radiation Oncology

## 2020-09-01 ENCOUNTER — Other Ambulatory Visit: Payer: Self-pay

## 2020-09-01 DIAGNOSIS — Z923 Personal history of irradiation: Secondary | ICD-10-CM | POA: Diagnosis not present

## 2020-09-01 DIAGNOSIS — C50411 Malignant neoplasm of upper-outer quadrant of right female breast: Secondary | ICD-10-CM | POA: Diagnosis not present

## 2020-09-01 DIAGNOSIS — Z170 Estrogen receptor positive status [ER+]: Secondary | ICD-10-CM | POA: Diagnosis not present

## 2020-09-01 DIAGNOSIS — Z79811 Long term (current) use of aromatase inhibitors: Secondary | ICD-10-CM | POA: Diagnosis not present

## 2020-09-01 NOTE — Progress Notes (Signed)
Radiation Oncology Follow up Note  Name: Carol [name]   Date:   09/01/2020 MRN:  [id number] DOB: Oct 24, 1945    This 74 y.o. female presents to the clinic today for 3-year follow-up status post accelerated partial breast radiation to her right breast for stage I ER/PR positive invasive mammary carcinoma  REFERRING PROVIDER: Crecencio Mc, MD  HPI: Patient is a 74 year old female now out over 3 years having completed accelerated partial breast irradiation to her right breast for stage I ER/PR positive invasive mammary carcinoma.  Seen today in routine follow-up she is doing well.  She specifically denies breast tenderness cough or bone pain..  She had a mammogram back in 20 showing an area suspicious for malignancy although this is being followed on serial mammograms by Dr. Tollie Pizza.  She is currently on Femara tolerating it well without side effect.  Patient is recently had back surgery. COMPLICATIONS OF TREATMENT: none  FOLLOW UP COMPLIANCE: keeps appointments   PHYSICAL EXAM:  BP (!) (P) 141/75 (BP Location: Left Arm, Patient Position: Sitting)   Pulse (P) 70   Temp (!) (P) 97.4 F (36.3 C) (Tympanic)   Resp (P) 16   Wt (P) 201 lb 6.4 oz (91.4 kg)   BMI (P) 35.68 kg/m  Lungs are clear to A&P cardiac examination essentially unremarkable with regular rate and rhythm. No dominant mass or nodularity is noted in either breast in 2 positions examined. Incision is well-healed. No axillary or supraclavicular adenopathy is appreciated. Cosmetic result is excellent.  Well-developed well-nourished patient in NAD. HEENT reveals PERLA, EOMI, discs not visualized.  Oral cavity is clear. No oral mucosal lesions are identified. Neck is clear without evidence of cervical or supraclavicular adenopathy. Lungs are clear to A&P. Cardiac examination is essentially unremarkable with regular rate and rhythm without murmur rub or thrill. Abdomen is benign with no organomegaly or masses noted. Motor  sensory and DTR levels are equal and symmetric in the upper and lower extremities. Cranial nerves II through XII are grossly intact. Proprioception is intact. No peripheral adenopathy or edema is identified. No motor or sensory levels are noted. Crude visual fields are within normal range.  RADIOLOGY RESULTS: Mammogram and ultrasound reviewed compatible with above-stated findings  PLAN: Present time I went to turn follow-up care over to Dr. Tollie Pizza.  He is closely watching her mammographic findings.  She does have some difficulty with coming back for yearly follow-ups.  I believe it is fine for Dr. Not to continue follow-up care.  I be happy to reevaluate the patient anytime should further treatment be indicated.  I would like to take this opportunity to thank you for allowing me to participate in the care of your patient.Noreene Filbert, MD

## 2020-09-06 ENCOUNTER — Other Ambulatory Visit: Payer: Medicare Other

## 2020-09-26 ENCOUNTER — Other Ambulatory Visit: Payer: Self-pay

## 2020-09-26 ENCOUNTER — Ambulatory Visit
Admission: RE | Admit: 2020-09-26 | Discharge: 2020-09-26 | Disposition: A | Discharge status: Home / Self Care | Payer: Medicare Other | Source: Ambulatory Visit | Attending: General Surgery | Admitting: General Surgery

## 2020-09-26 DIAGNOSIS — C50411 Malignant neoplasm of upper-outer quadrant of right female breast: Secondary | ICD-10-CM | POA: Diagnosis present

## 2020-11-01 ENCOUNTER — Telehealth: Payer: Self-pay

## 2020-11-01 NOTE — Telephone Encounter (Signed)
Pt said she has welts on her arms and legs that appear and then disappear. She has no other symptoms. She wants an in office appt. I scheduled her for 2pm appt with Joycelyn Schmid since Dr. Derrel Nip didn't have any appts but need to know if it is ok to bring patient in office?

## 2020-11-01 NOTE — Telephone Encounter (Signed)
Yes Okay for her to come in.   Call pt  Please ask her to come in early for her appt, would she be okay with coming in 1245. Unfortunately I have childcare issue due covid this week.  You may keep her appt time at 2pm

## 2020-11-01 NOTE — Telephone Encounter (Signed)
Please let her stay on 2pm slot.do not open.   I just need to be home close to 2 so this will help greatly. See her at 12:45 Thank you !!

## 2020-11-01 NOTE — Telephone Encounter (Signed)
Pt stated that she would be glad to come in at 12:45. Do you want me to leave her on the schedule in the 2pm slot or put her at 12:45 and open up the 2pm slot?

## 2020-11-02 ENCOUNTER — Ambulatory Visit: Payer: Medicare Other | Admitting: Family

## 2020-11-02 ENCOUNTER — Other Ambulatory Visit: Payer: Self-pay

## 2020-11-02 DIAGNOSIS — R21 Rash and other nonspecific skin eruption: Secondary | ICD-10-CM | POA: Diagnosis not present

## 2020-11-02 DIAGNOSIS — L299 Pruritus, unspecified: Secondary | ICD-10-CM | POA: Insufficient documentation

## 2020-11-02 MED ORDER — TRIAMCINOLONE ACETONIDE 0.025 % EX CREA: 1.0000 "application " | TOPICAL_CREAM | Freq: Two times a day (BID) | CUTANEOUS | 0 refills | Status: DC

## 2020-11-02 NOTE — Progress Notes (Signed)
Subjective:    Patient ID: Carol [name], female    DOB: [birth year], 75 y.o.   MRN: [id number]  CC: Carol [name] is a 75 y.o. female who presents today for an acute visit.    HPI: Complains of rash bilateral arms,onset 2-3 weeks ago.  Started right upper arm and noticed when woke up one morning. Lesions are size of nickel. Non draining.  Currently has lesions on right upper arm. She had had red lesion left shin which has resolved. In regard to rash on right arm , redness is improving.  Very itchy.   Lesions come and stay and stay for a week until they appear to resolve on their own after several days.   No fever, chills, nausea, vomiting.  Rash doesn't seem exacerbated by anxiety or temperature changes.   Started goldbond healing lotion on arms and legs about 3 weeks ago on legs and arms for dry skin.   Itch improves with StingEeze ( which contains Benzocaine, Phenol, Camphor)  Flu vaccine 09/29/20 left arm.  No h/o allergies, asthma.   Started zoloft in October 2021. Sleep has improved on zoloft.   H/o shingles    HISTORY:  Past Medical History:  Diagnosis Date  . Allergic rhinitis, cause unspecified   . Arthritis    hands  . Breast cancer ([place]) 2019   8 mm ER/PR positive, HER-2/neu not overexpressed. Right breast- Oncotype score 14 (4% chance of recurrence on antiestrogen alone)  . Bronchitis    h/o  . Cataract   . Fibromyalgia   . HLD (hyperlipidemia)   . Hypothyroidism   . 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 Other abnormal glucose   . 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 Personal history of colonic polyps   . Personal history of radiation therapy 2019   mammosite  . Pre-diabetes   . Presence of dental prosthetic device    implants - top and bottom  . Shingles   . Swelling of limb   . Thyroid goiter   . Tobacco use disorder   . Unspecified sinusitis (chronic)    Past Surgical History:   Procedure Laterality Date  . APPENDECTOMY  06/1997  . BREAST BIOPSY Right 11/05/2000    core with clip/neg  . BREAST BIOPSY Right 2011   benign, clip placed   . BREAST BIOPSY Right 12/02/2017   [facility] and [facility]  . CARPAL TUNNEL RELEASE Right 07/25/2020   Procedure: Carpal Tunnel Release - right;  Surgeon: Eustace Moore, MD;  Location: [place];  Service: Neurosurgery;  Laterality: Right;  . CATARACT EXTRACTION W/PHACO Left 07/30/2018   Procedure: CATARACT EXTRACTION PHACO AND INTRAOCULAR LENS PLACEMENT ([place]) LEFT;  Surgeon: Leandrew Koyanagi, MD;  Location: [place];  Service: Ophthalmology;  Laterality: Left;  . CATARACT EXTRACTION W/PHACO Right 08/27/2018   Procedure: CATARACT EXTRACTION PHACO AND INTRAOCULAR LENS PLACEMENT ([place])  RIGHT;  Surgeon: Leandrew Koyanagi, MD;  Location: [place];  Service: Ophthalmology;  Laterality: Right;  . COLONOSCOPY  10/15/2012   mutliple polyps.  Outlaw in [place].  Repeat 5 years.  . COLONOSCOPY W/ BIOPSIES  03/30/2004   Adenomatous polyp of the cecum, hyperplastic polyps of the splenic flexure, upper rectum and mid rectum.  . COLONOSCOPY WITH PROPOFOL N/A 03/19/2018   Tubular adenoma x2 without atypia.  Follow-up 2024.  Surgeon: Robert Bellow, MD;  Location[facility];  Service: Endoscopy;  Laterality: N/A;  . COLPOSCOPY  10/16/2011  HGSIL pap smear.  Negative.  Marland Kitchen EXTERNAL EAR SURGERY  09/2008   external - not specified  (RIGHT)  . EYE SURGERY    . MASTECTOMY, PARTIAL Right 12/23/2017   T1b, N0; 8 mm ER: 100%; PR: 100%; Her 2 neu not overexpressed.  MammoSite placement.  Surgeon: Robert Bellow, MD;  Location: [facility];  Service: General;  Laterality: Right;  . SENTINEL NODE BIOPSY Right 12/23/2017   Procedure: SENTINEL NODE BIOPSY;  Surgeon: Robert Bellow, MD;  Location: [facility];  Service: General;  Laterality: Right;   Family History  Problem Relation Age of Onset  . Heart failure Father   . Hypertension  Father   . Stroke Father   . Hyperlipidemia Father   . Heart failure Brother   . Diabetes Brother   . Heart disease Brother   . Diabetes Brother   . Heart disease Brother   . Hypertension Sister   . Depression Sister   . Hypertension Sister   . Asthma Sister   . Hyperlipidemia Sister   . Arthritis Other   . Coronary artery disease Other   . Hyperlipidemia Brother   . Hypertension Brother   . Breast cancer Maternal Aunt     Allergies: Patient has no known allergies. Current Outpatient Medications on File Prior to Visit  Medication Sig Dispense Refill  . Ascorbic Acid (VITAMIN C) 1000 MG tablet Take 1,000 mg by mouth daily.     Marland Kitchen aspirin 81 MG tablet Take 81 mg by mouth daily.     Marland Kitchen atorvastatin (LIPITOR) 40 MG tablet Take 1 tablet (40 mg total) by mouth daily. 90 tablet 1  . cetirizine (ZYRTEC) 10 MG tablet Take 10 mg by mouth every morning.     . Cholecalciferol (VITAMIN D3) 2000 UNITS capsule Take 2,000 Units by mouth daily.      . Coenzyme Q10 (COQ-10) 200 MG CAPS Take 200 mg by mouth daily.    . furosemide (LASIX) 20 MG tablet Take 1 tablet (20 mg total) by mouth 2 (two) times daily. 180 tablet 3  . Glucosamine Sulfate 1000 MG CAPS Take 1,000 mg by mouth daily.    Marland Kitchen letrozole (FEMARA) 2.5 MG tablet Take 1 tablet (2.5 mg total) by mouth daily. 90 tablet 3  . levothyroxine (SYNTHROID) 50 MCG tablet TAKE 1 TABLET EVERY DAY ON EMPTY STOMACHWITH A GLASS OF WATER AT LEAST 30-60 MINBEFORE BREAKFAST 90 tablet 0  . Misc Natural Products (OSTEO BI-FLEX JOINT SHIELD) TABS Take 1 tablet by mouth daily.     . Olopatadine HCl (PATADAY OP) Place 1 drop into both eyes daily as needed (allergies).    . Polyvinyl Alcohol-Povidone (REFRESH OP) Place 1 drop into both eyes daily as needed (dry eyes).    Marland Kitchen rOPINIRole (REQUIP) 0.5 MG tablet Take 1 tablet (0.5 mg total) by mouth at bedtime. 90 tablet 3  . senna (SENOKOT) 8.6 MG tablet Take 12 tablets by mouth at bedtime.     . sertraline (ZOLOFT) 50  MG tablet TAKE 1/2 TABLET BY MOUTH DAILY WITH FOODFOR 1 WEEK, INCREASE TO 1 FULL TABLET AFTER ONE WEEK 30 tablet 3   No current facility-administered medications on file prior to visit.    Social History   Tobacco Use  . Smoking status: Former Smoker    Packs/day: 1.00    Years: 40.00    Pack years: 40.00    Types: Cigarettes    Quit date: 04/02/2003    Years since quitting: 17.6  .  Smokeless tobacco: Never Used  . Tobacco comment: 7-10 cigarettes for past 40 years   Vaping Use  . Vaping Use: Every day  Substance Use Topics  . Alcohol use: Yes    Comment: rarely  . Drug use: No    Review of Systems  Constitutional: Negative for chills and fever.  Respiratory: Negative for cough.   Cardiovascular: Negative for chest pain and palpitations.  Gastrointestinal: Negative for nausea and vomiting.  Skin: Positive for rash.  Psychiatric/Behavioral: Negative for sleep disturbance (improved).      Objective:    BP 126/70 (BP Location: Left Arm, Patient Position: Sitting)   Pulse 89   Temp 98.7 F (37.1 C)   Ht 5' 2.99" (1.6 m)   Wt 202 lb 3.2 oz (91.7 kg)   SpO2 96%   BMI 35.83 kg/m    Physical Exam Vitals reviewed.  Constitutional:      Appearance: She is well-developed and well-nourished.  Eyes:     Conjunctiva/sclera: Conjunctivae normal.  Cardiovascular:     Rate and Rhythm: Normal rate and regular rhythm.     Pulses: Normal pulses.     Heart sounds: Normal heart sounds.  Pulmonary:     Effort: Pulmonary effort is normal.     Breath sounds: Normal breath sounds. No wheezing, rhonchi or rales.  Skin:    General: Skin is warm and dry.     Comments: 5-6 discrete erythematous 2-3 cm circular macules noted left upper arm. No increase warmth, edema. Non tender. Non fluctuant.   Neurological:     Mental Status: She is alert.  Psychiatric:        Mood and Affect: Mood and affect normal.        Speech: Speech normal.        Behavior: Behavior normal.        Thought  Content: Thought content normal.        Assessment & Plan:   Problem List Items Addressed This Visit      Musculoskeletal and Integument   Rash    Improving. No systemic features. No features to suggest infection. Timeline of 3 weeks corresponds with starting new lotion ( Goldbond). She has already stopped using this on her arms and legs a couple of days ago. Suspect may be culprit. I have provided her, if needed, low dose corticosteroid if needed for continued itch, suspect allergic reaction. She will let me know of further concerns.       Relevant Medications   triamcinolone (KENALOG) 0.025 % cream        I have discontinued Mabry H. Goldner's NEOMYCIN-POLYMYXIN-HYDROCORTISONE. I am also having her start on triamcinolone. Additionally, I am having her maintain her Glucosamine Sulfate, Osteo Bi-Flex Joint Shield, Vitamin D3, aspirin, cetirizine, vitamin C, CoQ-10, senna, Olopatadine HCl (PATADAY OP), Polyvinyl Alcohol-Povidone (REFRESH OP), letrozole, rOPINIRole, furosemide, atorvastatin, levothyroxine, and sertraline.   Meds ordered this encounter  Medications  . triamcinolone (KENALOG) 0.025 % cream    Sig: Apply 1 application topically 2 (two) times daily.    Dispense:  15 g    Refill:  0    Order Specific Question:   Supervising Provider    Answer:   Crecencio Mc [[id number]]    Return precautions given.   Risks, benefits, and alternatives of the medications and treatment plan prescribed today were discussed, and patient expressed understanding.   Education regarding symptom management and diagnosis given to patient on AVS.  Continue to follow with Crecencio Mc,  MD for routine health maintenance.   Carol [name] and I agreed with plan.   Mable Paris, FNP

## 2020-11-02 NOTE — Patient Instructions (Addendum)
Stop using Gold Bond as that sounds like may have contributory.   Trial of triamcinolone ( topical steroid) on red areas.   Let me know of any concerns

## 2020-11-03 NOTE — Assessment & Plan Note (Addendum)
Improving. No systemic features. No features to suggest infection. Timeline of 3 weeks corresponds with starting new lotion ( Goldbond). She has already stopped using this on her arms and legs a couple of days ago. Suspect may be culprit. I have provided her, if needed, low dose corticosteroid if needed for continued itch, suspect allergic reaction. She will let me know of further concerns.

## 2020-11-17 ENCOUNTER — Telehealth: Payer: Self-pay | Admitting: Internal Medicine

## 2020-11-17 NOTE — Telephone Encounter (Signed)
Patient ready to schedule Prolia injection insurance verification received and PA has been completed. OK to schedule on or after 11/18/20 please schedule and advise.

## 2020-11-17 NOTE — Telephone Encounter (Signed)
Einar Grad Key: [id number] - PA Case ID: [id number]  This medication or product is on your plan's list of covered drugs. Prior authorization is not required at this time. If your pharmacy has questions regarding the processing of your prescription, please have them call the [facility] pharmacy help desk at [phone]. **Please note: Formulary lowering, tiering exception, cost reduction and/or pre-benefit determination review (including prospective Medicare hospice reviews) requests cannot be requested using this method of submission. Please contact us at [phone] instead.

## 2020-11-23 ENCOUNTER — Other Ambulatory Visit: Payer: Self-pay | Admitting: Internal Medicine

## 2020-11-23 DIAGNOSIS — E7800 Pure hypercholesterolemia, unspecified: Secondary | ICD-10-CM

## 2020-11-23 DIAGNOSIS — I878 Other specified disorders of veins: Secondary | ICD-10-CM

## 2020-11-23 DIAGNOSIS — G2581 Restless legs syndrome: Secondary | ICD-10-CM

## 2020-11-25 NOTE — Telephone Encounter (Signed)
Pt called and scheduled Prolia for 11/28/20

## 2020-11-28 ENCOUNTER — Telehealth: Payer: Self-pay

## 2020-11-28 ENCOUNTER — Other Ambulatory Visit: Payer: Self-pay

## 2020-11-28 ENCOUNTER — Ambulatory Visit (INDEPENDENT_AMBULATORY_CARE_PROVIDER_SITE_OTHER): Payer: Medicare Other

## 2020-11-28 DIAGNOSIS — M85852 Other specified disorders of bone density and structure, left thigh: Secondary | ICD-10-CM

## 2020-11-28 MED ORDER — DENOSUMAB 60 MG/ML ~~LOC~~ SOSY
60.0000 mg | PREFILLED_SYRINGE | Freq: Once | SUBCUTANEOUS | Status: AC
  Administered 2020-11-28: 60 mg via SUBCUTANEOUS

## 2020-11-28 MED ORDER — TETANUS-DIPHTH-ACELL PERTUSSIS 5-2.5-18.5 LF-MCG/0.5 IM SUSY: 0.5000 mL | PREFILLED_SYRINGE | Freq: Once | INTRAMUSCULAR | 0 refills | Status: AC

## 2020-11-28 NOTE — Progress Notes (Signed)
Patient presented for 6-month Prolia injection SQ to right arm. Patient tolerated well. 

## 2020-11-28 NOTE — Telephone Encounter (Signed)
Pt states that she wants to come by and [ick up her rx for a tetanus shot? Around 2 today. Please advise

## 2020-11-28 NOTE — Telephone Encounter (Signed)
LMTCB. Need to let pt know that rx is ready for pick up. Rx has been placed up front.

## 2020-12-26 ENCOUNTER — Other Ambulatory Visit: Payer: Self-pay | Admitting: Internal Medicine

## 2020-12-26 ENCOUNTER — Ambulatory Visit (INDEPENDENT_AMBULATORY_CARE_PROVIDER_SITE_OTHER): Payer: Medicare Other

## 2020-12-26 VITALS — Ht 62.99 in | Wt 202.0 lb

## 2020-12-26 DIAGNOSIS — Z0000 Encounter for general adult medical examination without abnormal findings: Secondary | ICD-10-CM | POA: Diagnosis not present

## 2020-12-26 NOTE — Progress Notes (Addendum)
Subjective:   Carol [name] is a 75 y.o. female who presents for Medicare Annual (Subsequent) preventive examination.  Review of Systems    No ROS.  Medicare Wellness Virtual Visit.   Cardiac Risk Factors include: advanced age (>34men, >84 women)     Objective:    Today's Vitals   12/26/20 1110  Weight: 202 lb (91.6 kg)  Height: 5' 2.99" (1.6 m)   Body mass index is 35.79 kg/m.  Advanced Directives 12/26/2020 07/25/2020 07/19/2020 05/15/2020 12/24/2019 09/03/2019 08/27/2018  Does Patient Have a Medical Advance Directive? Yes Yes Yes No Yes No Yes  Type of Paramedic of [place];Living will Living will Living will - [place];Living will - [place]  Does patient want to make changes to medical advance directive? No - Patient declined - - - No - Patient declined - No - Patient declined  Copy of [place] in Chart? Yes - validated most recent copy scanned in chart (See row information) - - - Yes - validated most recent copy scanned in chart (See row information) - -  Would patient like information on creating a medical advance directive? - - - - - No - Patient declined -    Current Medications (verified) Outpatient Encounter Medications as of 12/26/2020  Medication Sig   Ascorbic Acid (VITAMIN C) 1000 MG tablet Take 1,000 mg by mouth daily.    aspirin 81 MG tablet Take 81 mg by mouth daily.    atorvastatin (LIPITOR) 40 MG tablet TAKE 1 TABLET BY MOUTH ONCE DAILY   cetirizine (ZYRTEC) 10 MG tablet Take 10 mg by mouth every morning.    Cholecalciferol (VITAMIN D3) 2000 UNITS capsule Take 2,000 Units by mouth daily.     Coenzyme Q10 (COQ-10) 200 MG CAPS Take 200 mg by mouth daily.   furosemide (LASIX) 20 MG tablet TAKE ONE TABLET TWICE DAILY   Glucosamine Sulfate 1000 MG CAPS Take 1,000 mg by mouth daily.   letrozole (FEMARA) 2.5 MG tablet TAKE ONE TABLET EVERY DAY   levothyroxine (SYNTHROID) 50  MCG tablet TAKE 1 TABLET EVERY DAY ON EMPTY STOMACHWITH A GLASS OF WATER AT LEAST 30-60 MINBEFORE BREAKFAST   Misc Natural Products (OSTEO BI-FLEX JOINT SHIELD) TABS Take 1 tablet by mouth daily.    Olopatadine HCl (PATADAY OP) Place 1 drop into both eyes daily as needed (allergies).   Polyvinyl Alcohol-Povidone (REFRESH OP) Place 1 drop into both eyes daily as needed (dry eyes).   rOPINIRole (REQUIP) 0.5 MG tablet TAKE ONE TABLET AT BEDTIME   senna (SENOKOT) 8.6 MG tablet Take 12 tablets by mouth at bedtime.    sertraline (ZOLOFT) 50 MG tablet TAKE 1/2 TABLET BY MOUTH DAILY WITH FOODFOR 1 WEEK, INCREASE TO 1 FULL TABLET AFTER ONE WEEK   triamcinolone (KENALOG) 0.025 % cream Apply 1 application topically 2 (two) times daily.   No facility-administered encounter medications on file as of 12/26/2020.    Allergies (verified) Patient has no known allergies.   History: Past Medical History:  Diagnosis Date   Allergic rhinitis, cause unspecified    Arthritis    hands   Breast cancer ([place]) 2019   8 mm ER/PR positive, HER-2/neu not overexpressed. 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)    Personal history of colonic polyps    Personal history of radiation therapy 2019   mammosite   Pre-diabetes    Presence of dental prosthetic device    implants - top and bottom   Shingles    Swelling of limb    Thyroid goiter    Tobacco use disorder    Unspecified sinusitis (chronic)    Past Surgical History:  Procedure Laterality Date   APPENDECTOMY  06/1997   BREAST BIOPSY Right 11/05/2000    core with clip/neg   BREAST BIOPSY Right 2011   benign, clip placed    BREAST BIOPSY Right 12/02/2017   [facility] and [facility]   CARPAL TUNNEL  RELEASE Right 07/25/2020   Procedure: Carpal Tunnel Release - right;  Surgeon: Eustace Moore, MD;  Location: [place];  Service: Neurosurgery;  Laterality: Right;   CATARACT EXTRACTION W/PHACO Left 07/30/2018   Procedure: CATARACT EXTRACTION PHACO AND INTRAOCULAR LENS PLACEMENT ([place]) LEFT;  Surgeon: Leandrew Koyanagi, MD;  Location: [place];  Service: Ophthalmology;  Laterality: Left;   CATARACT EXTRACTION W/PHACO Right 08/27/2018   Procedure: CATARACT EXTRACTION PHACO AND INTRAOCULAR LENS PLACEMENT ([place])  RIGHT;  Surgeon: Leandrew Koyanagi, MD;  Location: [place];  Service: Ophthalmology;  Laterality: Right;   COLONOSCOPY  10/15/2012   mutliple polyps.  Outlaw in [place].  Repeat 5 years.   COLONOSCOPY W/ BIOPSIES  03/30/2004   Adenomatous polyp of the cecum, hyperplastic polyps of the splenic flexure, upper rectum and mid rectum.   COLONOSCOPY WITH PROPOFOL N/A 03/19/2018   Tubular adenoma x2 without atypia.  Follow-up 2024.  Surgeon: Robert Bellow, MD;  Location: [facility] ENDOSCOPY;  Service: Endoscopy;  Laterality: N/A;   COLPOSCOPY  10/16/2011   HGSIL pap smear.  Negative.   EXTERNAL EAR SURGERY  09/2008   external - not specified  (RIGHT)   EYE SURGERY     MASTECTOMY, PARTIAL Right 12/23/2017   T1b, N0; 8 mm ER: 100%; PR: 100%; Her 2 neu not overexpressed.  MammoSite placement.  Surgeon: Robert Bellow, MD;  Location: [facility] ORS;  Service: General;  Laterality: Right;   SENTINEL NODE BIOPSY Right 12/23/2017   Procedure: SENTINEL NODE BIOPSY;  Surgeon: Robert Bellow, MD;  Location: [facility] OR[facility];  Service: General;  Laterality: Right;   Family History  Problem Relation Age of Onset   Heart failure Father    Hypertension Father    Stroke Father    Hyperlipidemia Father    Heart failure Brother    Diabetes Brother    Heart disease Brother    Diabetes Brother    Heart disease Brother    Hypertension Sister    Depression Sister    Hypertension Sister     Asthma Sister    Hyperlipidemia Sister    Arthritis Other    Coronary artery disease Other    Hyperlipidemia Brother    Hypertension Brother    Breast cancer Maternal Aunt    Social History   Socioeconomic History   Marital status: Married    Spouse name: Not on file   Number of children: 2   Years of education: Not on file   Highest education level: Not on file  Occupational History   Occupation: homemaker  Tobacco Use   Smoking status: Former Smoker    Packs/day: 1.00    Years: 40.00    Pack years: 40.00  Types: Cigarettes    Quit date: 04/02/2003    Years since quitting: 17.7   Smokeless tobacco: Never Used   Tobacco comment: 7-10 cigarettes for past 40 years   Vaping Use   Vaping Use: Every day  Substance and Sexual Activity   Alcohol use: Yes    Comment: rarely   Drug use: No   Sexual activity: Yes    Birth control/protection: Post-menopausal  Other Topics Concern   Not on file  Social History Narrative   Marital status: married x 10 years; happily married; no abuse.      Children: 2 sons; one granddaughter (77), 1 adopted grandson (1) in [place].      Lives: with husband.      Employment: unemployed/homemaker.      Tobacco: quit smoking 03/2012.  Electronic cigarette.      Alcohol: socially; 2 glasses per year.      Exercise:  None/sporadic      Seatbelt:  Always uses seat belts.   Smoke alarm and carbon monoxide detector in the home.       Guns:  No guns.      Caffeine OAC:ZYSAYT 3 servings per day.      ADLs: independent with ADLs in 2018; drives; no assistant devices.       Advanced Directives: yes; DNR/DNI   Social Determinants of Health   Financial Resource Strain: Low Risk    Difficulty of Paying Living Expenses: Not hard at all  Food Insecurity: No Food Insecurity   Worried About Charity fundraiser in the Last Year: Never true   [place] in the Last Year: Never true  Transportation Needs: No Transportation Needs   Lack of Transportation  (Medical): No   Lack of Transportation (Non-Medical): No  Physical Activity: Not on file  Stress: No Stress Concern Present   Feeling of Stress : Not at all  Social Connections: Unknown   Frequency of Communication with Friends and Family: Not on file   Frequency of Social Gatherings with Friends and Family: More than three times a week   Attends Religious Services: Not on Electrical engineer or Organizations: Not on file   Attends Archivist Meetings: Not on file   Marital Status: Married    Tobacco Counseling Counseling given: Not Answered Comment: 7-10 cigarettes for past 40 years    Clinical Intake:  Pre-visit preparation completed: Yes        Diabetes: Yes   Nutrition Risk Assessment: Has the patient had any N/V/D within the last 2 weeks?  No  Does the patient have any non-healing wounds?  No  Has the patient had any unintentional weight loss or weight gain?  No   Diabetes: If diabetic, was a CBG obtained today?  No  Did the patient bring in their glucometer from home?  No   Financial Strains and Diabetes Management: Are you having any financial strains with the device, your supplies or your medication? No .  Does the patient want to be seen by Chronic Care Management for management of their diabetes?  No  Would the patient like to be referred to a Nutritionist or for Diabetic Management?  No   How often do you need to have someone help you when you read instructions, pamphlets, or other written materials from your doctor or pharmacy?: 1 - Never  Interpreter Needed?: No      Activities of Daily Living In your present state of  health, do you have any difficulty performing the following activities: 12/26/2020 07/19/2020  Hearing? N N  Vision? N N  Difficulty concentrating or making decisions? N N  Walking or climbing stairs? N N  Dressing or bathing? N Y  Doing errands, shopping? N N  Preparing Food and eating ? Y -  Comment Patient does  not cook. Husband assist or dines out. Self feeds. -  Using the Toilet? N -  In the past six months, have you accidently leaked urine? N -  Do you have problems with loss of bowel control? N -  Managing your Medications? N -  Managing your Finances? N -  Housekeeping or managing your Housekeeping? Y -  Comment Husband assist -  Some recent data might be hidden    Patient Care Team: Crecencio Mc, MD as PCP - General (Internal Medicine) Minna Merritts, MD as Consulting Physician (Cardiology) Bary Castilla, Forest Gleason, MD (General Surgery) Crecencio Mc, MD (Internal Medicine)  Indicate any recent Medical Services you may have received from other than [facility] providers in the past year (date may be approximate).     Assessment:   This is a routine wellness examination for Charley.  I connected with Leahanna today by telephone and verified that I am speaking with the correct person using two identifiers. Location patient: home Location provider: work Persons participating in the virtual visit: patient, Marine scientist.    I discussed the limitations, risks, security and privacy concerns of performing an evaluation and management service by telephone and the availability of in person appointments. The patient expressed understanding and verbally consented to this telephonic visit.    Interactive audio and video telecommunications were attempted between this provider and patient, however failed, due to patient having technical difficulties OR patient did not have access to video capability.  We continued and completed visit with audio only.  Some vital signs may be absent or patient reported.   Hearing/Vision screen  Hearing Screening   '125Hz'$  $Remo'250Hz'DdHrV$'500Hz'$'1000Hz'$'2000Hz'$'3000Hz'$'4000Hz'$'6000Hz'$'8000Hz'$   Right ear:           Left ear:           Comments: Patient is able to hear conversational tones without difficulty.  No issues reported.  Vision Screening Comments: Cataract extraction, bilateral   Visual acuity not assessed, virtual visit. They have seen their ophthalmologist in the last 12 months.   Dietary issues and exercise activities discussed: Current Exercise Habits: Home exercise routine, Time (Minutes): 15, Frequency (Times/Week): 5, Weekly Exercise (Minutes/Week): 75, Intensity: Mild  Regular diet Good water intake  Goals      Increase physical activity     As tolerated       Depression Screen PHQ 2/9 Scores 12/26/2020 11/02/2020 07/06/2020 12/24/2019 01/03/2018 03/06/2017 03/06/2017  PHQ - 2 Score 0 0 0 0 0 0 0  PHQ- 9 Score - - - - - - -    Fall Risk Fall Risk  12/26/2020 11/02/2020 08/10/2020 07/06/2020 06/01/2020  Falls in the past year? 0 0 0 0 0  Number falls in past yr: 0 0 - 0 -  Injury with Fall? 0 0 - 0 -  Follow up Falls evaluation completed Falls evaluation completed Falls evaluation completed Falls evaluation completed Falls evaluation completed    [place]: Handrails in use when climbing stairs? Yes Home free of loose throw rugs in walkways, pet beds, electrical cords, etc? Yes  Adequate lighting in  your home to reduce risk of falls? Yes   ASSISTIVE DEVICES UTILIZED TO PREVENT FALLS: Life alert? No  Use of a cane, walker or w/c? No  Grab bars in the bathroom? Yes  Shower chair or bench in shower? Yes  Elevated toilet seat or a handicapped toilet? No   TIMED UP AND GO:  Was the test performed? No . Virtual visit.   Cognitive Function: Patient is alert and oriented x3.  Denies difficulty focusing, making decisions, memory loss.  MMSE/6CIT declined.   MMSE - Mini Mental State Exam 12/24/2019  Not completed: Unable to complete       Immunizations Immunization History  Administered Date(s) Administered   Influenza Split 08/21/2010, 08/15/2014   Influenza, High Dose Seasonal PF 11/20/2017, 09/05/2018, 07/30/2019   Influenza, Seasonal, Injecte, Preservative Fre 10/06/2012   Influenza,inj,Quad PF,6+ Mos  07/03/2016   Influenza-Unspecified 09/29/2020   Moderna Sars-Covid-2 Vaccination 11/24/2019, 12/22/2019, 09/15/2020   Pneumococcal Conjugate-13 09/20/2014   Pneumococcal Polysaccharide-23 10/06/2012   Tdap 09/12/2011   Zoster 10/28/2012   Zoster Recombinat (Shingrix) 08/13/2018, 10/13/2018   Health Maintenance There are no preventive care reminders to display for this patient. Health Maintenance  Topic Date Due   HEMOGLOBIN A1C  01/08/2021   OPHTHALMOLOGY EXAM  01/27/2021   COVID-19 Vaccine (4 - Booster for Moderna series) 03/16/2021   FOOT EXAM  03/31/2021   URINE MICROALBUMIN  07/05/2021   TETANUS/TDAP  09/11/2021   MAMMOGRAM  09/26/2021   COLONOSCOPY (Pts 45-3yrs Insurance coverage will need to be confirmed)  03/19/2028   INFLUENZA VACCINE  Completed   DEXA SCAN  Completed   Hepatitis C Screening  Completed   PNA vac Low Risk Adult  Completed   HPV VACCINES  Aged Out   Colorectal cancer screening: Type of screening: Colonoscopy. Completed 03/19/18. Repeat every 10 years   Mammogram status: Completed 09/26/20. Repeat every year  Lung Cancer Screening: (Low Dose CT Chest recommended if Age 25-80 years, 30 pack-year currently smoking OR have quit w/in 15years.) does not qualify.   Vision Screening: Recommended annual ophthalmology exams for early detection of glaucoma and other disorders of the eye. Is the patient up to date with their annual eye exam?  Yes   Dental Screening: Recommended annual dental exams for proper oral hygiene.  Community Resource Referral / Chronic Care Management: CRR required this visit?  No   CCM required this visit?  No      Plan:   Keep all routine maintenance appointments.   Follow up 01/09/21 @ 10:30  I have personally reviewed and noted the following in the patient's chart:   Medical and social history Use of alcohol, tobacco or illicit drugs  Current medications and supplements Functional ability and status Nutritional  status Physical activity Advanced directives List of other physicians Hospitalizations, surgeries, and ER visits in previous 12 months Vitals Screenings to include cognitive, depression, and falls Referrals and appointments  In addition, I have reviewed and discussed with patient certain preventive protocols, quality metrics, and best practice recommendations. A written personalized care plan for preventive services as well as general preventive health recommendations were provided to patient via mychart.     OBrien-Blaney, Diann Bangerter L, LPN   [id number]    I have reviewed the above information and agree with above.   Deborra Medina, MD

## 2020-12-26 NOTE — Patient Instructions (Addendum)
Carol [name] , Thank you for taking time to come for your Medicare Wellness Visit. I appreciate your ongoing commitment to your health goals. Please review the following plan we discussed and let me know if I can assist you in the future.   These are the goals we discussed: Goals    . Increase physical activity     As tolerated       This is a list of the screening recommended for you and due dates:  Health Maintenance  Topic Date Due  . Hemoglobin A1C  01/08/2021  . Eye exam for diabetics  01/27/2021  . COVID-19 Vaccine (4 - Booster for Moderna series) 03/16/2021  . Complete foot exam   03/31/2021  . Urine Protein Check  07/05/2021  . Tetanus Vaccine  09/11/2021  . Mammogram  09/26/2021  . Colon Cancer Screening  03/19/2028  . Flu Shot  Completed  . DEXA scan (bone density measurement)  Completed  .  Hepatitis C: One time screening is recommended by Center for Disease Control  (CDC) for  adults born from [birth year] through 1964.   Completed  . Pneumonia vaccines  Completed  . HPV Vaccine  Aged Out    Immunizations Immunization History  Administered Date(s) Administered  . Influenza Split 08/21/2010, 08/15/2014  . Influenza, High Dose Seasonal PF 11/20/2017, 09/05/2018, 07/30/2019  . Influenza, Seasonal, Injecte, Preservative Fre 10/06/2012  . Influenza,inj,Quad PF,6+ Mos 07/03/2016  . Influenza-Unspecified 09/29/2020  . Moderna Sars-Covid-2 Vaccination 11/24/2019, 12/22/2019, 09/15/2020  . Pneumococcal Conjugate-13 09/20/2014  . Pneumococcal Polysaccharide-23 10/06/2012  . Tdap 09/12/2011  . Zoster 10/28/2012  . Zoster Recombinat (Shingrix) 08/13/2018, 10/13/2018   Keep all routine maintenance appointments.   Follow up 01/09/21 @ 10:30  Advanced directives: on file  Conditions/risks identified: none new  Follow up in one year for your annual wellness visit.   Preventive Care 74 Years and Older, Female Preventive care refers to lifestyle choices and visits with your  health care provider that can promote health and wellness. What does preventive care include?  A yearly physical exam. This is also called an annual well check.  Dental exams once or twice a year.  Routine eye exams. Ask your health care provider how often you should have your eyes checked.  Personal lifestyle choices, including:  Daily care of your teeth and gums.  Regular physical activity.  Eating a healthy diet.  Avoiding tobacco and drug use.  Limiting alcohol use.  Practicing safe sex.  Taking low-dose aspirin every day.  Taking vitamin and mineral supplements as recommended by your health care provider. What happens during an annual well check? The services and screenings done by your health care provider during your annual well check will depend on your age, overall health, lifestyle risk factors, and family history of disease. Counseling  Your health care provider may ask you questions about your:  Alcohol use.  Tobacco use.  Drug use.  Emotional well-being.  Home and relationship well-being.  Sexual activity.  Eating habits.  History of falls.  Memory and ability to understand (cognition).  Work and work Statistician.  Reproductive health. Screening  You may have the following tests or measurements:  Height, weight, and BMI.  Blood pressure.  Lipid and cholesterol levels. These may be checked every 5 years, or more frequently if you are over 74 years old.  Skin check.  Lung cancer screening. You may have this screening every year starting at age 74 if you have a 30-pack-year history of  smoking and currently smoke or have quit within the past 15 years.  Fecal occult blood test (FOBT) of the stool. You may have this test every year starting at age 74.  Flexible sigmoidoscopy or colonoscopy. You may have a sigmoidoscopy every 5 years or a colonoscopy every 10 years starting at age 74.  Hepatitis C blood test.  Hepatitis B blood  test.  Sexually transmitted disease (STD) testing.  Diabetes screening. This is done by checking your blood sugar (glucose) after you have not eaten for a while (fasting). You may have this done every 1-3 years.  Bone density scan. This is done to screen for osteoporosis. You may have this done starting at age 74.  Mammogram. This may be done every 1-2 years. Talk to your health care provider about how often you should have regular mammograms. Talk with your health care provider about your test results, treatment options, and if necessary, the need for more tests. Vaccines  Your health care provider may recommend certain vaccines, such as:  Influenza vaccine. This is recommended every year.  Tetanus, diphtheria, and acellular pertussis (Tdap, Td) vaccine. You may need a Td booster every 10 years.  Zoster vaccine. You may need this after age 74.  Pneumococcal 13-valent conjugate (PCV13) vaccine. One dose is recommended after age 74.  Pneumococcal polysaccharide (PPSV23) vaccine. One dose is recommended after age 74. Talk to your health care provider about which screenings and vaccines you need and how often you need them. This information is not intended to replace advice given to you by your health care provider. Make sure you discuss any questions you have with your health care provider. Document Released: 10/28/2015 Document Revised: 06/20/2016 Document Reviewed: 08/02/2015 Elsevier Interactive Patient Education  2017 [place] Prevention in the Home Falls can cause injuries. They can happen to people of all ages. There are many things you can do to make your home safe and to help prevent falls. What can I do on the outside of my home?  Regularly fix the edges of walkways and driveways and fix any cracks.  Remove anything that might make you trip as you walk through a door, such as a raised step or threshold.  Trim any bushes or trees on the path to your home.  Use  bright outdoor lighting.  Clear any walking paths of anything that might make someone trip, such as rocks or tools.  Regularly check to see if handrails are loose or broken. Make sure that both sides of any steps have handrails.  Any raised decks and porches should have guardrails on the edges.  Have any leaves, snow, or ice cleared regularly.  Use sand or salt on walking paths during winter.  Clean up any spills in your garage right away. This includes oil or grease spills. What can I do in the bathroom?  Use night lights.  Install grab bars by the toilet and in the tub and shower. Do not use towel bars as grab bars.  Use non-skid mats or decals in the tub or shower.  If you need to sit down in the shower, use a plastic, non-slip stool.  Keep the floor dry. Clean up any water that spills on the floor as soon as it happens.  Remove soap buildup in the tub or shower regularly.  Attach bath mats securely with double-sided non-slip rug tape.  Do not have throw rugs and other things on the floor that can make you trip. What can  I do in the bedroom?  Use night lights.  Make sure that you have a light by your bed that is easy to reach.  Do not use any sheets or blankets that are too big for your bed. They should not hang down onto the floor.  Have a firm chair that has side arms. You can use this for support while you get dressed.  Do not have throw rugs and other things on the floor that can make you trip. What can I do in the kitchen?  Clean up any spills right away.  Avoid walking on wet floors.  Keep items that you use a lot in easy-to-reach places.  If you need to reach something above you, use a strong step stool that has a grab bar.  Keep electrical cords out of the way.  Do not use floor polish or wax that makes floors slippery. If you must use wax, use non-skid floor wax.  Do not have throw rugs and other things on the floor that can make you trip. What can  I do with my stairs?  Do not leave any items on the stairs.  Make sure that there are handrails on both sides of the stairs and use them. Fix handrails that are broken or loose. Make sure that handrails are as long as the stairways.  Check any carpeting to make sure that it is firmly attached to the stairs. Fix any carpet that is loose or worn.  Avoid having throw rugs at the top or bottom of the stairs. If you do have throw rugs, attach them to the floor with carpet tape.  Make sure that you have a light switch at the top of the stairs and the bottom of the stairs. If you do not have them, ask someone to add them for you. What else can I do to help prevent falls?  Wear shoes that:  Do not have high heels.  Have rubber bottoms.  Are comfortable and fit you well.  Are closed at the toe. Do not wear sandals.  If you use a stepladder:  Make sure that it is fully opened. Do not climb a closed stepladder.  Make sure that both sides of the stepladder are locked into place.  Ask someone to hold it for you, if possible.  Clearly mark and make sure that you can see:  Any grab bars or handrails.  First and last steps.  Where the edge of each step is.  Use tools that help you move around (mobility aids) if they are needed. These include:  Canes.  Walkers.  Scooters.  Crutches.  Turn on the lights when you go into a dark area. Replace any light bulbs as soon as they burn out.  Set up your furniture so you have a clear path. Avoid moving your furniture around.  If any of your floors are uneven, fix them.  If there are any pets around you, be aware of where they are.  Review your medicines with your doctor. Some medicines can make you feel dizzy. This can increase your chance of falling. Ask your doctor what other things that you can do to help prevent falls. This information is not intended to replace advice given to you by your health care provider. Make sure you  discuss any questions you have with your health care provider. Document Released: 07/28/2009 Document Revised: 03/08/2016 Document Reviewed: 11/05/2014 Elsevier Interactive Patient Education  2017 [employer].

## 2021-01-09 ENCOUNTER — Encounter: Payer: Self-pay | Admitting: Internal Medicine

## 2021-01-09 ENCOUNTER — Other Ambulatory Visit: Payer: Self-pay

## 2021-01-09 ENCOUNTER — Telehealth: Payer: Self-pay | Admitting: Internal Medicine

## 2021-01-09 ENCOUNTER — Telehealth: Payer: Self-pay

## 2021-01-09 ENCOUNTER — Ambulatory Visit: Payer: Medicare Other | Admitting: Internal Medicine

## 2021-01-09 VITALS — BP 110/70 | HR 62 | Temp 98.2°F | Ht 62.99 in | Wt 206.0 lb

## 2021-01-09 DIAGNOSIS — E785 Hyperlipidemia, unspecified: Secondary | ICD-10-CM

## 2021-01-09 DIAGNOSIS — L299 Pruritus, unspecified: Secondary | ICD-10-CM

## 2021-01-09 DIAGNOSIS — E039 Hypothyroidism, unspecified: Secondary | ICD-10-CM | POA: Diagnosis not present

## 2021-01-09 DIAGNOSIS — E1169 Type 2 diabetes mellitus with other specified complication: Secondary | ICD-10-CM

## 2021-01-09 DIAGNOSIS — E119 Type 2 diabetes mellitus without complications: Secondary | ICD-10-CM

## 2021-01-09 DIAGNOSIS — R221 Localized swelling, mass and lump, neck: Secondary | ICD-10-CM

## 2021-01-09 LAB — CBC WITH DIFFERENTIAL/PLATELET
Basophils Absolute: 0 10*3/uL (ref 0.0–0.1)
Basophils Relative: 0.5 % (ref 0.0–3.0)
Eosinophils Absolute: 0.1 10*3/uL (ref 0.0–0.7)
Eosinophils Relative: 1.6 % (ref 0.0–5.0)
HCT: 43.3 % (ref 36.0–46.0)
Hemoglobin: 14.6 g/dL (ref 12.0–15.0)
Lymphocytes Relative: 22.7 % (ref 12.0–46.0)
Lymphs Abs: 2.1 10*3/uL (ref 0.7–4.0)
MCHC: 33.7 g/dL (ref 30.0–36.0)
MCV: 88.1 fl (ref 78.0–100.0)
Monocytes Absolute: 0.6 10*3/uL (ref 0.1–1.0)
Monocytes Relative: 6.4 % (ref 3.0–12.0)
Neutro Abs: 6.3 10*3/uL (ref 1.4–7.7)
Neutrophils Relative %: 68.8 % (ref 43.0–77.0)
Platelets: 201 10*3/uL (ref 150.0–400.0)
RBC: 4.91 Mil/uL (ref 3.87–5.11)
RDW: 14.7 % (ref 11.5–15.5)
WBC: 9.1 10*3/uL (ref 4.0–10.5)

## 2021-01-09 LAB — LIPID PANEL
Cholesterol: 168 mg/dL (ref 0–200)
HDL: 58.2 mg/dL (ref >39.00)
LDL Cholesterol: 80 mg/dL (ref 0–99)
NonHDL: 110.21
Total CHOL/HDL Ratio: 3
Triglycerides: 152 mg/dL — ABNORMAL HIGH (ref 0.0–149.0)
VLDL: 30.4 mg/dL (ref 0.0–40.0)

## 2021-01-09 LAB — COMPREHENSIVE METABOLIC PANEL
ALT: 12 U/L (ref 0–35)
AST: 13 U/L (ref 0–37)
Albumin: 4.4 g/dL (ref 3.5–5.2)
Alkaline Phosphatase: 70 U/L (ref 39–117)
BUN: 9 mg/dL (ref 6–23)
CO2: 33 mEq/L — ABNORMAL HIGH (ref 19–32)
Calcium: 9.8 mg/dL (ref 8.4–10.5)
Chloride: 100 mEq/L (ref 96–112)
Creatinine, Ser: 0.8 mg/dL (ref 0.40–1.20)
GFR: 72.91 mL/min (ref >60.00)
Glucose, Bld: 125 mg/dL — ABNORMAL HIGH (ref 70–99)
Potassium: 4.4 mEq/L (ref 3.5–5.1)
Sodium: 143 mEq/L (ref 135–145)
Total Bilirubin: 0.7 mg/dL (ref 0.2–1.2)
Total Protein: 6.9 g/dL (ref 6.0–8.3)

## 2021-01-09 LAB — SEDIMENTATION RATE: Sed Rate: 3 mm/hr (ref 0–30)

## 2021-01-09 LAB — HEMOGLOBIN A1C: Hgb A1c MFr Bld: 6.1 % (ref 4.6–6.5)

## 2021-01-09 LAB — TSH: TSH: 2.57 u[IU]/mL (ref 0.35–4.50)

## 2021-01-09 NOTE — Telephone Encounter (Signed)
Called patient to let her know that she does not have an appointment for 4-25 she needs to be put on the schedule for a follow up appoint

## 2021-01-09 NOTE — Progress Notes (Signed)
Subjective:  Patient ID: [name], female    DOB: 03/04/1946  Age: 75 y.o. MRN: [id number]  CC: The primary encounter diagnosis was Type 2 diabetes mellitus without complication, without long-term current use of insulin ([place]). Diagnoses of Hyperlipidemia associated with type 2 diabetes mellitus ([place]), Hypothyroidism (acquired), Itching, Neck mass, and Pruritic rash determined by examination were also pertinent to this visit.  HPI Sarabeth Benton presents for evaluation of  Pruritic rash pruritic .  This visit occurred during the SARS-CoV-2 public health emergency.  Safety protocols were in place, including screening questions prior to the visit, additional usage of staff PPE, and extensive cleaning of exam room while observing appropriate contact time as indicated for disinfecting solutions.   Lile is a 75 yr old female with a history of breast cancer diagnosed and treated I n2019,  Currently on Letrozole ,  Aortic atherosclerosis and CAD managed with statin therapy,  hypothyroidisn managed with levothyroxine,  And  Depression, managed with sertraline,  who presents with a recurrent papular ras that  has been present since December .  Started with a linear array of papules on right side of neck; initially without itching or pain. Lasted 2 weeks. She states that she "Scrubbed them off " and applied some form of prescription cream of her husband's.  Since then has developed patches in various places on her face, torso and extremities . She most recently developed an area on her chin and on the dorsum of her right foot.  The patches are comprised of discrete papules that express clear fluid if squeezed, and are  pruritic. The surrounding area becomes pick and warm. No systemic symptoms . Has not seen dermatology . She tried to get a diagnosis or treatment by proxy (through her husband) but was advised to see an allergist for testing .  Has not actually seen dermatology but takes  zyrtec daily chronically for years. She has No pets.  No yardwork.  Does not leave home except to grocery shop. No new medications     Saw margaret in January  Was prescribed triamcinolone but did not try it.  .   Needs Referral to allergist  and dermatologist  2)  Neck mass:  She thinks she may have a Goiter  That is enlarging.  She has noticed that it  Obstructs her airway when she Tucks chin in a neck flexion position.  No prior evaluation   Outpatient Medications Prior to Visit  Medication Sig Dispense Refill   Ascorbic Acid (VITAMIN C) 1000 MG tablet Take 1,000 mg by mouth daily.      aspirin 81 MG tablet Take 81 mg by mouth daily.      atorvastatin (LIPITOR) 40 MG tablet TAKE 1 TABLET BY MOUTH ONCE DAILY 90 tablet 1   cetirizine (ZYRTEC) 10 MG tablet Take 10 mg by mouth every morning.      Cholecalciferol (VITAMIN D3) 2000 UNITS capsule Take 2,000 Units by mouth daily.       Coenzyme Q10 (COQ-10) 200 MG CAPS Take 200 mg by mouth daily.     furosemide (LASIX) 20 MG tablet TAKE ONE TABLET TWICE DAILY 180 tablet 3   Glucosamine Sulfate 1000 MG CAPS Take 1,000 mg by mouth daily.     letrozole (FEMARA) 2.5 MG tablet TAKE ONE TABLET EVERY DAY 90 tablet 3   levothyroxine (SYNTHROID) 50 MCG tablet TAKE 1 TABLET EVERY DAY ON EMPTY STOMACHWITH A GLASS OF WATER AT LEAST 30-60 MINBEFORE BREAKFAST 90  tablet 0   Misc Natural Products (OSTEO BI-FLEX JOINT SHIELD) TABS Take 1 tablet by mouth daily.      Olopatadine HCl (PATADAY OP) Place 1 drop into both eyes daily as needed (allergies).     Polyvinyl Alcohol-Povidone (REFRESH OP) Place 1 drop into both eyes daily as needed (dry eyes).     rOPINIRole (REQUIP) 0.5 MG tablet TAKE ONE TABLET AT BEDTIME 90 tablet 3   senna (SENOKOT) 8.6 MG tablet Take 12 tablets by mouth at bedtime.      sertraline (ZOLOFT) 50 MG tablet TAKE 1 TABLET BY MOUTH DAILY 90 tablet 3   triamcinolone (KENALOG) 0.025 % cream Apply 1 application topically 2 (two)  times daily. 15 g 0   No facility-administered medications prior to visit.    Review of Systems;  Patient denies headache, fevers, malaise, unintentional weight loss, skin rash, eye pain, sinus congestion and sinus pain, sore throat, dysphagia,  hemoptysis , cough, dyspnea, wheezing, chest pain, palpitations, orthopnea, edema, abdominal pain, nausea, melena, diarrhea, constipation, flank pain, dysuria, hematuria, urinary  Frequency, nocturia, numbness, tingling, seizures,  Focal weakness, Loss of consciousness,  Tremor, insomnia, depression, anxiety, and suicidal ideation.      Objective:  BP 110/70 (BP Location: Left Arm, Patient Position: Sitting, Cuff Size: Large)    Pulse 62    Temp 98.2 F (36.8 C) (Oral)    Ht 5' 2.99" (1.6 m)    Wt 206 lb (93.4 kg)    SpO2 96%    BMI 36.50 kg/m   BP Readings from Last 3 Encounters:  01/09/21 110/70  11/02/20 126/70  09/01/20 (!) (P) 141/75    Wt Readings from Last 3 Encounters:  01/09/21 206 lb (93.4 kg)  12/26/20 202 lb (91.6 kg)  11/02/20 202 lb 3.2 oz (91.7 kg)    General appearance: alert, cooperative and appears stated age Ears: normal TM's and external ear canals both ears Throat: lips, mucosa, and tongue normal; teeth and gums normal Neck: no adenopathy, no carotid bruit, supple, symmetrical, trachea midline and thyroid appears diffusely enlarged  Back: symmetric, no curvature. ROM normal. No CVA tenderness. Lungs: clear to auscultation bilaterally Heart: regular rate and rhythm, S1, S2 normal, no murmur, click, rub or gallop Abdomen: soft, non-tender; bowel sounds normal; no masses,  no organomegaly Pulses: 2+ and symmetric Skin: discrete erythematous macular  patches of  Pruritic papules present on chin, arms  Feet,  And legs.  Lymph nodes: Cervical, supraclavicular, and axillary nodes normal.  Lab Results  Component Value Date   HGBA1C 6.1 01/09/2021   HGBA1C 5.8 07/11/2020   HGBA1C 6.7 (A) 03/31/2020    Lab Results   Component Value Date   CREATININE 0.80 01/09/2021   CREATININE 1.16 07/11/2020   CREATININE 1.03 (H) 05/15/2020    Lab Results  Component Value Date   WBC 9.1 01/09/2021   HGB 14.6 01/09/2021   HCT 43.3 01/09/2021   PLT 201.0 01/09/2021   GLUCOSE 125 (H) 01/09/2021   CHOL 168 01/09/2021   TRIG 152.0 (H) 01/09/2021   HDL 58.20 01/09/2021   LDLCALC 80 01/09/2021   ALT 12 01/09/2021   AST 13 01/09/2021   NA 143 01/09/2021   K 4.4 01/09/2021   CL 100 01/09/2021   CREATININE 0.80 01/09/2021   BUN 9 01/09/2021   CO2 33 (H) 01/09/2021   TSH 2.57 01/09/2021   HGBA1C 6.1 01/09/2021   MICROALBUR 1.2 07/05/2020    US BREAST LTD UNI RIGHT INC AXILLA  Result  Date: 09/26/2020 CLINICAL DATA:  Patient has a 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 Patient developed a mass along the lateral aspect of the right breast following her surgery, which she feels is unchanged in size over at least the last year. This was assessed on diagnostic imaging on 07/23/2019. A mixed echogenicity mass was noted corresponding to the palpable abnormality, at 8 o'clock, 12 cm the nipple. This was felt to be suspicious with ultrasound-guided core needle biopsy recommended. The patient subsequently followed up with her surgeon who felt that this was an area of scarring. Biopsy has not been performed. EXAM: DIGITAL DIAGNOSTIC BILATERAL MAMMOGRAM WITH CAD AND TOMO ULTRASOUND RIGHT BREAST COMPARISON:  Previous exam(s). ACR Breast Density Category b: There are scattered areas of fibroglandular density. FINDINGS: Post lumpectomy changes in the posterolateral aspect of the right breast are stable. There are no suspicious masses or areas of nonsurgical architectural distortion. There are no suspicious calcifications. Mammographic images were processed with CAD. On physical exam, there is a visible protrude  rinse of the skin along the lateral margin of the right breast lumpectomy scar, with an underlying firm but mobile mass. Targeted ultrasound is performed, showing a mixed echogenicity mass in the right breast at 8 o'clock, 12 cm the nipple, margins mostly indistinct, currently measuring 3.8 x 2.0 x 2.2 cm, previously measured at 3.1 x 1.9 x 2.7 cm. This corresponds to the palpable mass. IMPRESSION: 1. Probably benign mass along the far lateral aspect of the right breast, deep to the lateral margin of the patient's lumpectomy scar. This is most likely an area of fat necrosis. There has not been a convincing change in this mass sonographically, allowing for differences in measurement technique, with the variability related to the indistinct mass margins. This mass is not defined mammographically, likely lateral to the mammographic field of view. 2. Benign post lumpectomy changes in the posterolateral right breast seen mammographically. RECOMMENDATION: 1. Six-month follow-up right breast ultrasound to reassess the probably benign area of presumed fat necrosis corresponding to the palpable lateral breast mass. I have discussed the findings and recommendations with the patient. If applicable, a reminder letter will be sent to the patient regarding the next appointment. BI-RADS CATEGORY  3: Probably benign. Electronically Signed   By: Lajean Manes M.D.   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 Patient developed a mass along the lateral aspect of the right breast following her surgery, which she feels is unchanged in size over at least the last year. This was assessed on diagnostic imaging on 07/23/2019. A mixed echogenicity mass was noted corresponding to the palpable abnormality, at 8  o'clock, 12 cm the nipple. This was felt to be suspicious with ultrasound-guided core needle biopsy recommended. The patient subsequently followed up with her surgeon who felt that this was an area of scarring. Biopsy has not been performed. EXAM: DIGITAL DIAGNOSTIC BILATERAL MAMMOGRAM WITH CAD AND TOMO ULTRASOUND RIGHT BREAST COMPARISON:  Previous exam(s). ACR Breast Density Category b: There are scattered areas of fibroglandular density. FINDINGS: Post lumpectomy changes in the posterolateral aspect of the right breast are stable. There are no suspicious masses or areas of nonsurgical architectural distortion. There are no suspicious calcifications.  Mammographic images were processed with CAD. On physical exam, there is a visible protrude rinse of the skin along the lateral margin of the right breast lumpectomy scar, with an underlying firm but mobile mass. Targeted ultrasound is performed, showing a mixed echogenicity mass in the right breast at 8 o'clock, 12 cm the nipple, margins mostly indistinct, currently measuring 3.8 x 2.0 x 2.2 cm, previously measured at 3.1 x 1.9 x 2.7 cm. This corresponds to the palpable mass. IMPRESSION: 1. Probably benign mass along the far lateral aspect of the right breast, deep to the lateral margin of the patient's lumpectomy scar. This is most likely an area of fat necrosis. There has not been a convincing change in this mass sonographically, allowing for differences in measurement technique, with the variability related to the indistinct mass margins. This mass is not defined mammographically, likely lateral to the mammographic field of view. 2. Benign post lumpectomy changes in the posterolateral right breast seen mammographically. RECOMMENDATION: 1. Six-month follow-up right breast ultrasound to reassess the probably benign area of presumed fat necrosis corresponding to the palpable lateral breast mass. I have discussed the findings and recommendations with the patient. If  applicable, a reminder letter will be sent to the patient regarding the next appointment. BI-RADS CATEGORY  3: Probably benign. Electronically Signed   By: Lajean Manes M.D.   On: 09/26/2020 14:13    Assessment & Plan:   Problem List Items Addressed This Visit      Unprioritized   Hypothyroidism (acquired)    Thyroid function is WNL on current dose.  No current changes needed.   Lab Results  Component Value Date   TSH 2.57 01/09/2021         Relevant Orders   TSH (Completed)   Hyperlipidemia associated with type 2 diabetes mellitus ([place])   Relevant Orders   Lipid panel (Completed)   Type 2 diabetes mellitus without complication, without long-term current use of insulin ([place]) - Primary    Diet controlled,  Improved with weight loss  and improved diet.  a1c  Is now < 6.5 . Continue asa and statin .  She has no microalbuinuria   Lab Results  Component Value Date   HGBA1C 6.1 01/09/2021   Lab Results  Component Value Date   LABMICR <3.0 03/06/2017   MICROALBUR 1.2 07/05/2020   MICROALBUR <0.7 04/07/2020           Relevant Orders   Hemoglobin A1c (Completed)   Comprehensive metabolic panel (Completed)   Microalbumin / creatinine urine ratio   Hemoglobin A1c   Comprehensive metabolic panel   Pruritic rash determined by examination    It is a papular pruritic rash of uncertain etiology but does not appear to be a drug reaction given the timing (present since December). She has no neutrophilia or eosinophilia on today's CBC,  And ESR is normal. Advised to increase zyrtec to bid ,  Dermatology and Allergy referrals made.  Lab Results  Component Value Date   WBC 9.1 01/09/2021   HGB 14.6 01/09/2021   HCT 43.3 01/09/2021   MCV 88.1 01/09/2021   PLT 201.0 01/09/2021         Neck mass    Goiter suspected. [place] of neck ordered       Relevant Orders   US THYROID    Other Visit Diagnoses    Itching       Relevant Orders   CBC with Differential/Platelet  (Completed)   Sedimentation rate (Completed)  Ambulatory referral to Allergy   Ambulatory referral to Dermatology    A total of 40 minutes was spent with patient more than half of which was spent in counseling patient on the above mentioned issues , reviewing and explaining recent labs and imaging studies done, and coordination of care.  I am having Gladis Riffle maintain her Glucosamine Sulfate, Osteo Bi-Flex Joint Shield, Vitamin D3, aspirin, cetirizine, vitamin C, CoQ-10, senna, Olopatadine HCl (PATADAY OP), Polyvinyl Alcohol-Povidone (REFRESH OP), triamcinolone, letrozole, furosemide, levothyroxine, rOPINIRole, atorvastatin, and sertraline.  No orders of the defined types were placed in this encounter.   There are no discontinued medications.  Follow-up: Return in about 4 weeks (around 02/06/2021) for follow up diabetes.   Crecencio Mc, MD

## 2021-01-09 NOTE — Patient Instructions (Signed)
You can increase your zyrtec to every 12 hours if it helps the itching  Referral to Dr Kellie Moor and to [facility] are in progress  I have ordered a thyroid ultrasound for your neck mass to see if there is a goiter

## 2021-01-09 NOTE — Telephone Encounter (Signed)
err

## 2021-01-10 DIAGNOSIS — R221 Localized swelling, mass and lump, neck: Secondary | ICD-10-CM | POA: Insufficient documentation

## 2021-01-10 NOTE — Assessment & Plan Note (Signed)
Thyroid function is WNL on current dose.  No current changes needed.   Lab Results  Component Value Date   TSH 2.57 01/09/2021

## 2021-01-10 NOTE — Assessment & Plan Note (Signed)
Goiter suspected. [place] of [place] ordered

## 2021-01-10 NOTE — Assessment & Plan Note (Addendum)
Diet controlled,  Improved with weight loss  and improved diet.  a1c  Is now < 6.5 . Continue asa and statin .  She has no microalbuinuria   Lab Results  Component Value Date   HGBA1C 6.1 01/09/2021   Lab Results  Component Value Date   LABMICR <3.0 03/06/2017   MICROALBUR 1.2 07/05/2020   MICROALBUR <0.7 04/07/2020

## 2021-01-10 NOTE — Assessment & Plan Note (Addendum)
It is a papular pruritic rash of uncertain etiology but does not appear to be a drug reaction given the timing (present since December). She has no neutrophilia or eosinophilia on today's CBC,  And ESR is normal. Advised to increase zyrtec to bid ,  Dermatology and Allergy referrals made.  Lab Results  Component Value Date   WBC 9.1 01/09/2021   HGB 14.6 01/09/2021   HCT 43.3 01/09/2021   MCV 88.1 01/09/2021   PLT 201.0 01/09/2021

## 2021-01-11 ENCOUNTER — Telehealth: Payer: Self-pay | Admitting: Internal Medicine

## 2021-01-11 NOTE — Telephone Encounter (Signed)
lft vm for pt to call ofc to sch US Thyroid. Thank you!

## 2021-01-13 ENCOUNTER — Telehealth: Payer: Self-pay | Admitting: Internal Medicine

## 2021-01-13 NOTE — Telephone Encounter (Signed)
lft vm for pt to call ofc to sch Thyroid. thanks

## 2021-01-25 ENCOUNTER — Telehealth: Payer: Self-pay | Admitting: Internal Medicine

## 2021-01-25 NOTE — Telephone Encounter (Signed)
Patient called and said that the atorvastatin (LIPITOR) 40 MG tablet is causing her rash. She is requesting a change in medication. She is still taking Lipitor until she hears from Dr. Derrel Nip.

## 2021-01-26 MED ORDER — FAMOTIDINE 20 MG PO TABS: 20.0000 mg | ORAL_TABLET | Freq: Two times a day (BID) | ORAL | 0 refills | Status: DC

## 2021-01-26 MED ORDER — PREDNISONE 10 MG PO TABS: ORAL_TABLET | ORAL | 0 refills | Status: DC

## 2021-01-26 NOTE — Telephone Encounter (Signed)
Please call patient back  Stop atorvastatin.  Continue zyrtec every 12 hours and add famotidine 20 mg every 12 hours. (this will be sent to her pharmacy along with a prednisone prescription which she should start if the rash doesn't start to improve in 24 to 48 hours )

## 2021-01-26 NOTE — Telephone Encounter (Signed)
Spoken to patient she stated she has developed a rash all over her body, the whelps are very big up to a nickel in size. Has not taken any medication for her sx. She has doubled her zyrtec to see if that will help and it has not. No facial swelling, tongue, throat, SOB, heart palpitations, blurry vision, and fever/chills. Instructed patient to stop taking that medication. Patient has an appointment on may 9, 22. She is wondering what else can she take .

## 2021-01-26 NOTE — Telephone Encounter (Signed)
Patient has been informed.

## 2021-02-07 ENCOUNTER — Ambulatory Visit: Payer: Medicare Other | Admitting: Internal Medicine

## 2021-02-08 ENCOUNTER — Other Ambulatory Visit: Payer: Self-pay

## 2021-02-08 ENCOUNTER — Ambulatory Visit
Admission: RE | Admit: 2021-02-08 | Discharge: 2021-02-08 | Disposition: A | Discharge status: Home / Self Care | Payer: Medicare Other | Source: Ambulatory Visit | Attending: Internal Medicine | Admitting: Internal Medicine

## 2021-02-08 DIAGNOSIS — R221 Localized swelling, mass and lump, neck: Secondary | ICD-10-CM | POA: Diagnosis present

## 2021-02-20 ENCOUNTER — Encounter: Payer: Self-pay | Admitting: Internal Medicine

## 2021-02-20 ENCOUNTER — Ambulatory Visit: Payer: Medicare Other | Admitting: Internal Medicine

## 2021-02-20 ENCOUNTER — Other Ambulatory Visit: Payer: Self-pay

## 2021-02-20 ENCOUNTER — Other Ambulatory Visit: Payer: Self-pay | Admitting: Internal Medicine

## 2021-02-20 DIAGNOSIS — C50411 Malignant neoplasm of upper-outer quadrant of right female breast: Secondary | ICD-10-CM | POA: Diagnosis not present

## 2021-02-20 DIAGNOSIS — I700 Atherosclerosis of aorta: Secondary | ICD-10-CM | POA: Diagnosis not present

## 2021-02-20 DIAGNOSIS — E1169 Type 2 diabetes mellitus with other specified complication: Secondary | ICD-10-CM | POA: Diagnosis not present

## 2021-02-20 DIAGNOSIS — Z6841 Body Mass Index (BMI) 40.0 and over, adult: Secondary | ICD-10-CM

## 2021-02-20 DIAGNOSIS — Z170 Estrogen receptor positive status [ER+]: Secondary | ICD-10-CM

## 2021-02-20 DIAGNOSIS — E66813 Obesity, class 3: Secondary | ICD-10-CM

## 2021-02-20 DIAGNOSIS — E785 Hyperlipidemia, unspecified: Secondary | ICD-10-CM

## 2021-02-20 MED ORDER — ROSUVASTATIN CALCIUM 20 MG PO TABS: 20.0000 mg | ORAL_TABLET | ORAL | 2 refills | Status: DC

## 2021-02-20 NOTE — Patient Instructions (Addendum)
Stop the atorvastatin  Start the alternative,  Every other day,  Rosuvastatin 20 mg    If the rash persists,,  We will stop the rosuvastatin and start Zetia for cholesterol management   Return for fasting labs a dya or two prior to your  physical in July

## 2021-02-20 NOTE — Progress Notes (Signed)
Subjective:  Patient ID: [name], female    DOB: 1946-04-12  Age: 75 y.o. MRN: [id number]  CC: Diagnoses of Hyperlipidemia associated with type 2 diabetes mellitus ([place]), Abdominal aortic atherosclerosis ([place]), Malignant neoplasm of upper-outer quadrant of right breast in female, estrogen receptor positive ([place]), and Class 3 severe obesity with serious comorbidity and body mass index (BMI) of 40.0 to 44.9 in adult, unspecified obesity type [facility]) were pertinent to this visit.  HPI Rhylan Gross presents for follow up on chronic multiple issues  1) Aortic atherosclerosis :  Moderate,  By August 2021 CT renal stone study:  She stopped atorvastatin for 1.5 weeks because of a diffuse pruritic rash that she developed on her arms after increasing her dose to 80 mg but the rash did  Not improve and is also present in less abundance on legs and abdomen.     had resumed atorvastatin as preventive therapy given documented evidence of moderate  atherosclerosis in the aorta noted on recent  CT of abdomen and  pelvis and the prognostic implications of this finding. She has NOTED INCREASED shoulder blade pain similar to Fibromyalgia but less severe,  been ambivalent in the past and has deferred statin therapy  2) Thyroid ultrasound reviewed :  No masses  Or nodules. (April 2022)  Discussed weight and need for reduction   3) Depression   screen:  Previous issues with  Insomnia helped by zoloft taken at dinner.   Eats when not hungry  Due to boredom.  Does not want an appetite suppressant or any more medications for depression    4) Obesity:  As above .   Back hurting  More since she gained weight   5) Rash :  Taking zyrtec twice daily but never started the famotidine; doesn't pan to. The eruptions on her arms are the most bothersome  And lower legs.  Has a dermatology appt in several weeks.       Outpatient Medications Prior to Visit  Medication Sig Dispense Refill  . Ascorbic  Acid (VITAMIN C) 1000 MG tablet Take 1,000 mg by mouth daily.     Marland Kitchen aspirin 81 MG tablet Take 81 mg by mouth daily.     . cetirizine (ZYRTEC) 10 MG tablet Take 10 mg by mouth every morning.     . Cholecalciferol (VITAMIN D3) 2000 UNITS capsule Take 2,000 Units by mouth daily.      . Coenzyme Q10 (COQ-10) 200 MG CAPS Take 200 mg by mouth daily.    . furosemide (LASIX) 20 MG tablet TAKE ONE TABLET TWICE DAILY 180 tablet 3  . Glucosamine Sulfate 1000 MG CAPS Take 1,000 mg by mouth daily.    Marland Kitchen letrozole (FEMARA) 2.5 MG tablet TAKE ONE TABLET EVERY DAY 90 tablet 3  . Misc Natural Products (OSTEO BI-FLEX JOINT SHIELD) TABS Take 1 tablet by mouth daily.     . Olopatadine HCl (PATADAY OP) Place 1 drop into both eyes daily as needed (allergies).    . Polyvinyl Alcohol-Povidone (REFRESH OP) Place 1 drop into both eyes daily as needed (dry eyes).    Marland Kitchen rOPINIRole (REQUIP) 0.5 MG tablet TAKE ONE TABLET AT BEDTIME 90 tablet 3  . senna (SENOKOT) 8.6 MG tablet Take 12 tablets by mouth at bedtime.     . sertraline (ZOLOFT) 50 MG tablet TAKE 1 TABLET BY MOUTH DAILY 90 tablet 3  . famotidine (PEPCID) 20 MG tablet Take 1 tablet (20 mg total) by mouth 2 (two) times  daily. 30 tablet 0  . levothyroxine (SYNTHROID) 50 MCG tablet TAKE 1 TABLET EVERY DAY ON EMPTY STOMACHWITH A GLASS OF WATER AT LEAST 30-60 MINBEFORE BREAKFAST 90 tablet 0  . predniSONE (DELTASONE) 10 MG tablet 6 tablets on Day 1 , then reduce by 1 tablet daily until gone (Patient not taking: Reported on 02/20/2021) 21 tablet 0  . triamcinolone (KENALOG) 0.025 % cream Apply 1 application topically 2 (two) times daily. (Patient not taking: Reported on 02/20/2021) 15 g 0   No facility-administered medications prior to visit.    Review of Systems;  Patient denies headache, fevers, malaise, unintentional weight loss, skin rash, eye pain, sinus congestion and sinus pain, sore throat, dysphagia,  hemoptysis , cough, dyspnea, wheezing, chest pain, palpitations,  orthopnea, edema, abdominal pain, nausea, melena, diarrhea, constipation, flank pain, dysuria, hematuria, urinary  Frequency, nocturia, numbness, tingling, seizures,  Focal weakness, Loss of consciousness,  Tremor, insomnia, depression, anxiety, and suicidal ideation.      Objective:  BP 134/68 (BP Location: Left Arm, Patient Position: Sitting, Cuff Size: Large)   Pulse 81   Temp 99 F (37.2 C) (Oral)   Resp 15   Ht 5\' 2"  (1.575 m)   Wt 211 lb 9.6 oz (96 kg)   SpO2 93%   BMI 38.70 kg/m   BP Readings from Last 3 Encounters:  02/20/21 134/68  01/09/21 110/70  11/02/20 126/70    Wt Readings from Last 3 Encounters:  02/20/21 211 lb 9.6 oz (96 kg)  01/09/21 206 lb (93.4 kg)  12/26/20 202 lb (91.6 kg)    General appearance: alert, cooperative and appears stated age Ears: normal TM's and external ear canals both ears Throat: lips, mucosa, and tongue normal; teeth and gums normal Neck: no adenopathy, no carotid bruit, supple, symmetrical, trachea midline and thyroid not enlarged, symmetric, no tenderness/mass/nodules Back: symmetric, no curvature. ROM normal. No CVA tenderness. Lungs: clear to auscultation bilaterally Heart: regular rate and rhythm, S1, S2 normal, no murmur, click, rub or gallop Abdomen: soft, non-tender; bowel sounds normal; no masses,  no organomegaly Pulses: 2+ and symmetric Skin: Skin color, texture, turgor normal. No rashes or lesions Lymph nodes: Cervical, supraclavicular, and axillary nodes normal.  Lab Results  Component Value Date   HGBA1C 6.1 01/09/2021   HGBA1C 5.8 07/11/2020   HGBA1C 6.7 (A) 03/31/2020    Lab Results  Component Value Date   CREATININE 0.80 01/09/2021   CREATININE 1.16 07/11/2020   CREATININE 1.03 (H) 05/15/2020    Lab Results  Component Value Date   WBC 9.1 01/09/2021   HGB 14.6 01/09/2021   HCT 43.3 01/09/2021   PLT 201.0 01/09/2021   GLUCOSE 125 (H) 01/09/2021   CHOL 168 01/09/2021   TRIG 152.0 (H) 01/09/2021   HDL  58.20 01/09/2021   LDLCALC 80 01/09/2021   ALT 12 01/09/2021   AST 13 01/09/2021   NA 143 01/09/2021   K 4.4 01/09/2021   CL 100 01/09/2021   CREATININE 0.80 01/09/2021   BUN 9 01/09/2021   CO2 33 (H) 01/09/2021   TSH 2.57 01/09/2021   HGBA1C 6.1 01/09/2021   MICROALBUR 1.2 07/05/2020    US THYROID  Result Date: 02/08/2021 CLINICAL DATA:  Neck mass Goiter? EXAM: THYROID ULTRASOUND TECHNIQUE: Ultrasound examination of the thyroid gland and adjacent soft tissues was performed. COMPARISON:  None. FINDINGS: Parenchymal Echotexture: Mildly heterogeneous Isthmus: 0.4 cm Right lobe: 3.7 x 1.4 x 1.4 cm Left lobe: 4.1 x 1.6 x 1.3 cm _________________________________________________________ Estimated total number  of nodules >/= 1 cm: 0 Number of spongiform nodules >/=  2 cm not described below (TR1): 0 Number of mixed cystic and solid nodules >/= 1.5 cm not described below (TR2): 0 _________________________________________________________ No discrete nodules are seen within the thyroid gland. IMPRESSION: 1. Mild diffuse heterogeneity of the thyroid without discrete nodule. 2. No discrete sonographic finding identified on targeted evaluation of the area of palpable abnormality in the right neck. The above is in keeping with the ACR TI-RADS recommendations - J Am Coll Radiol 2017;14:587-595. Electronically Signed   By: Miachel Roux M.D.   On: 02/08/2021 14:36    Assessment & Plan:   Problem List Items Addressed This Visit      Unprioritized   Abdominal aortic atherosclerosis ([place])     Aortic atherosclerosis :  Discussed need for ongoing statin therapy given documented evidence of moderate  atherosclerosis in the aorta noted on recent  CT of abdomen and  pelvis and the prognostic implications of this finding.   She will stop atrovastatin, given the rash,  And start a trial of rosuvastatin.  If rash persists,  Will stop statin and start Zetia       Relevant Medications   rosuvastatin (CRESTOR) 20 MG  tablet   Hyperlipidemia associated with type 2 diabetes mellitus ([place])    Currently well-controlled on current medications .  hemoglobin A1c is at goal of less than 7.0 . Patient continues to have a rash that may be due to atorvastatin,  Also having myalgias that are mild,  Not like her FM myalgias,  Will change statin to Crestor followed by Zetia if symptoms persist.  She is  Up to date on annual eye exam and foot exam is normal today. Patient has no microalbuminuria. Patient is tolerating statin therapy for CAD risk reduction and on ACE/ARB for renal protection and hypertension       Relevant Medications   rosuvastatin (CRESTOR) 20 MG tablet   Malignant neoplasm of upper-outer quadrant of right female breast ([place])    Diagnosed in march 2019  Treated with lumpectomy and adjuvant therapy.  Continue follow up with Dr Bary Castilla and oncology      Obesity    I have addressed  BMI and recommended wt loss of 10% of body weigh over the next 6 months using a low glycemic index diet and regular exercise a minimum of 5 days per week.            I provided  30 minutes of  face-to-face time during this encounter reviewing patient's current problems including persistent rash,  Aortic atherosclerosis , recent mammogram and thyroid ultrasound,  labs ,  providing counseling on weight management  , and coordination  of care .  I have discontinued Doniesha H. Bunten's famotidine. I am also having her start on rosuvastatin. Additionally, I am having her maintain her Glucosamine Sulfate, Osteo Bi-Flex Joint Shield, Vitamin D3, aspirin, cetirizine, vitamin C, CoQ-10, senna, Olopatadine HCl (PATADAY OP), Polyvinyl Alcohol-Povidone (REFRESH OP), triamcinolone, letrozole, furosemide, rOPINIRole, sertraline, and predniSONE.  Meds ordered this encounter  Medications  . rosuvastatin (CRESTOR) 20 MG tablet    Sig: Take 1 tablet (20 mg total) by mouth every other day.    Dispense:  45 tablet    Refill:  2     Medications Discontinued During This Encounter  Medication Reason  . famotidine (PEPCID) 20 MG tablet     Follow-up: Return in about 8 weeks (around 04/18/2021).   Crecencio Mc, MD

## 2021-02-20 NOTE — Assessment & Plan Note (Addendum)
Currently well-controlled on current medications .  hemoglobin A1c is at goal of less than 7.0 . Patient continues to have a rash that may be due to atorvastatin,  Also having myalgias that are mild,  Not like her FM myalgias,  Will change statin to Crestor followed by Zetia if symptoms persist.  She is  Up to date on annual eye exam and foot exam is normal today. Patient has no microalbuminuria. Patient is tolerating statin therapy for CAD risk reduction and on ACE/ARB for renal protection and hypertension

## 2021-02-21 NOTE — Assessment & Plan Note (Signed)
Diagnosed in march 2019  Treated with lumpectomy and adjuvant therapy.  Continue follow up with Dr Byrnett and oncology ?

## 2021-02-21 NOTE — Assessment & Plan Note (Signed)
I have addressed  BMI and recommended wt loss of 10% of body weigh over the next 6 months using a low glycemic index diet and regular exercise a minimum of 5 days per week.   

## 2021-02-21 NOTE — Assessment & Plan Note (Signed)
  Aortic atherosclerosis :  Discussed need for ongoing statin therapy given documented evidence of moderate  atherosclerosis in the aorta noted on recent  CT of abdomen and  pelvis and the prognostic implications of this finding.   She will stop atrovastatin, given the rash,  And start a trial of rosuvastatin.  If rash persists,  Will stop statin and start Zetia

## 2021-04-13 ENCOUNTER — Other Ambulatory Visit (INDEPENDENT_AMBULATORY_CARE_PROVIDER_SITE_OTHER): Payer: Medicare Other

## 2021-04-13 ENCOUNTER — Other Ambulatory Visit: Payer: Self-pay

## 2021-04-13 DIAGNOSIS — E119 Type 2 diabetes mellitus without complications: Secondary | ICD-10-CM | POA: Diagnosis not present

## 2021-04-13 LAB — COMPREHENSIVE METABOLIC PANEL
ALT: 12 U/L (ref 0–35)
AST: 13 U/L (ref 0–37)
Albumin: 4.3 g/dL (ref 3.5–5.2)
Alkaline Phosphatase: 53 U/L (ref 39–117)
BUN: 11 mg/dL (ref 6–23)
CO2: 33 mEq/L — ABNORMAL HIGH (ref 19–32)
Calcium: 9.4 mg/dL (ref 8.4–10.5)
Chloride: 104 mEq/L (ref 96–112)
Creatinine, Ser: 0.95 mg/dL (ref 0.40–1.20)
GFR: 59.22 mL/min — ABNORMAL LOW (ref >60.00)
Glucose, Bld: 110 mg/dL — ABNORMAL HIGH (ref 70–99)
Potassium: 4.3 mEq/L (ref 3.5–5.1)
Sodium: 145 mEq/L (ref 135–145)
Total Bilirubin: 0.7 mg/dL (ref 0.2–1.2)
Total Protein: 6.6 g/dL (ref 6.0–8.3)

## 2021-04-13 LAB — MICROALBUMIN / CREATININE URINE RATIO
Creatinine,U: 196.8 mg/dL
Microalb Creat Ratio: 0.8 mg/g (ref 0.0–30.0)
Microalb, Ur: 1.6 mg/dL (ref 0.0–1.9)

## 2021-04-13 LAB — HEMOGLOBIN A1C: Hgb A1c MFr Bld: 6.3 % (ref 4.6–6.5)

## 2021-04-19 ENCOUNTER — Encounter: Payer: Self-pay | Admitting: Internal Medicine

## 2021-04-19 ENCOUNTER — Other Ambulatory Visit: Payer: Self-pay

## 2021-04-19 ENCOUNTER — Ambulatory Visit (INDEPENDENT_AMBULATORY_CARE_PROVIDER_SITE_OTHER): Payer: Medicare Other | Admitting: Internal Medicine

## 2021-04-19 VITALS — BP 122/74 | HR 73 | Temp 96.7°F | Resp 16 | Ht 62.0 in | Wt 214.2 lb

## 2021-04-19 DIAGNOSIS — E039 Hypothyroidism, unspecified: Secondary | ICD-10-CM | POA: Diagnosis not present

## 2021-04-19 DIAGNOSIS — E1169 Type 2 diabetes mellitus with other specified complication: Secondary | ICD-10-CM

## 2021-04-19 DIAGNOSIS — Z0001 Encounter for general adult medical examination with abnormal findings: Secondary | ICD-10-CM | POA: Diagnosis not present

## 2021-04-19 DIAGNOSIS — Z6841 Body Mass Index (BMI) 40.0 and over, adult: Secondary | ICD-10-CM

## 2021-04-19 DIAGNOSIS — E785 Hyperlipidemia, unspecified: Secondary | ICD-10-CM

## 2021-04-19 DIAGNOSIS — I700 Atherosclerosis of aorta: Secondary | ICD-10-CM

## 2021-04-19 MED ORDER — LEVOTHYROXINE SODIUM 50 MCG PO TABS: 50.0000 ug | ORAL_TABLET | Freq: Every day | ORAL | 0 refills | Status: DC

## 2021-04-19 MED ORDER — OZEMPIC (0.25 OR 0.5 MG/DOSE) 2 MG/1.5ML ~~LOC~~ SOPN: 0.5000 mg | PEN_INJECTOR | SUBCUTANEOUS | 0 refills | Status: DC

## 2021-04-19 NOTE — Progress Notes (Signed)
Patient ID: Carol [name], female    DOB: 17-May-1946  Age: 75 y.o. MRN: [id number]  The patient is here for annual preventive examination and management of other chronic and acute problems.   The risk factors are reflected in the social history.  The roster of all physicians providing medical care to patient - is listed in the Snapshot section of the chart.  Activities of daily living:  The patient is 100% independent in all ADLs: dressing, toileting, feeding as well as independent mobility  Home safety : The patient has smoke detectors in the home. They wear seatbelts.  There are no firearms at home. There is no violence in the home.   There is no risks for hepatitis, STDs or HIV. There is no   history of blood transfusion. They have no travel history to infectious disease endemic areas of the world.  The patient has seen their dentist in the last six month. They have seen their eye doctor in the last year. She denies hearing difficulty with regard to whispered voices and some television programs.  They have deferred audiologic testing in the last year.  They do not  have excessive sun exposure. Discussed the need for sun protection: hats, long sleeves and use of sunscreen if there is significant sun exposure.   Diet: the importance of a healthy diet is discussed. They do have a healthy diet.  The benefits of regular aerobic exercise were discussed. She walks 4 times per week ,  20 minutes.   Depression screen: there are no [name] or vegative symptoms of depression- , change in appetite, anhedonia, sadness/tearfullness.  Cognitive assessment: the patient manages all their financial and personal affairs and is actively engaged. They could relate day,date,year and events; recalled 2/3 objects at 3 minutes; performed clock-face test normally.  The following portions of the patient's history were reviewed and updated as appropriate: allergies, current medications, past family history,  past medical history,  past surgical history, past social history  and problem list.  Visual acuity was not assessed per patient preference since she has regular follow up with her ophthalmologist. Hearing and body mass index were assessed and reviewed.   During the course of the visit the patient was educated and counseled about appropriate screening and preventive services including : fall prevention , diabetes screening, nutrition counseling, colorectal cancer screening, and recommended immunizations.    CC: Diagnoses of Abdominal aortic atherosclerosis ([place]), Encounter for general adult medical examination with abnormal findings, Hyperlipidemia associated with type 2 diabetes mellitus ([place]), Hypothyroidism (acquired), and Class 3 severe obesity with serious comorbidity and body mass index (BMI) of 40.0 to 44.9 in adult, unspecified obesity type [facility]) were pertinent to this visit.   1) Reviewed findings of prior CT scan today. Which noted aortic atherosclerosis .  Patient is tolerating high potency statin therapy    2) Morbid obesity: discussed trial of ozempic.  Not walking due to knee pain,  back pAIN.  Fibromyalgia prevents her from walking in a swimming pool  3) Diffuse pruritic rash on extremities,  truck scalp and face  : started after receiving the COVID booster .  Dasher thinks it may  the cause of the rash. He has Prescribed topical  triamcinolone   4) Bilateral hand numbness : did not improve with spine and right wrist surgery by Luretha Rued.  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   She has a past surgical history that includes Appendectomy (06/1997); External ear surgery (09/2008); Colposcopy (10/16/2011); Colonoscopy (10/15/2012); Colonoscopy w/ biopsies (03/30/2004); Sentinel node biopsy (Right, 12/23/2017); Colonoscopy with propofol (N/A, 03/19/2018); Cataract extraction w/PHACO (Left, 07/30/2018); Cataract extraction w/PHACO (Right, 08/27/2018); Eye surgery; Carpal tunnel release (Right, 07/25/2020); Breast biopsy (Right, 11/05/2000); Breast biopsy (Right, 2011); Breast biopsy (Right, 12/02/2017); and Mastectomy, partial (Right, 12/23/2017).   Her family history includes Arthritis in an other family member; Asthma in her sister; Breast cancer in her maternal aunt; Coronary artery disease in an other family member; Depression in her sister; Diabetes in her brother and brother; Heart disease in her brother and brother; Heart failure in her brother and father; Hyperlipidemia in her brother, father, and sister; Hypertension in her brother, father, sister, and sister; Stroke in her father.She reports that she quit smoking about 18 years ago. Her smoking use included cigarettes. She has a 40.00 pack-year smoking history. She has never used smokeless tobacco. She reports current alcohol use. She reports that she does not use drugs.  Outpatient Medications Prior to Visit  Medication Sig Dispense Refill   Ascorbic Acid (VITAMIN C) 1000 MG tablet Take 1,000 mg by mouth daily.      aspirin 81 MG tablet Take 81 mg by mouth daily.      cetirizine (ZYRTEC) 10 MG tablet Take 10 mg by mouth every morning.      Cholecalciferol (VITAMIN D3) 2000 UNITS capsule Take 2,000 Units by mouth daily.       Coenzyme Q10 (COQ-10) 200 MG CAPS Take 200 mg by mouth daily.     furosemide (LASIX) 20 MG tablet  TAKE ONE TABLET TWICE DAILY 180 tablet 3   Glucosamine Sulfate 1000 MG CAPS Take 1,000 mg by mouth daily.     letrozole (FEMARA) 2.5 MG tablet TAKE ONE TABLET EVERY DAY 90 tablet 3   Misc Natural Products (OSTEO BI-FLEX JOINT SHIELD) TABS Take 1 tablet by mouth daily.      Olopatadine HCl (PATADAY OP) Place 1 drop into both eyes daily as needed (allergies).     Polyvinyl Alcohol-Povidone (REFRESH OP) Place 1 drop into both eyes daily as needed (dry eyes).     rOPINIRole (REQUIP) 0.5 MG tablet TAKE ONE TABLET AT BEDTIME 90 tablet 3   rosuvastatin (CRESTOR) 20 MG tablet Take 1 tablet (20 mg total) by mouth every other day. 45 tablet 2   senna (SENOKOT) 8.6 MG tablet Take 12 tablets by mouth at bedtime.      sertraline (ZOLOFT) 50 MG tablet TAKE 1 TABLET BY MOUTH DAILY 90 tablet 3   levothyroxine (SYNTHROID) 50 MCG tablet TAKE 1 TABLET EVERY DAY ON EMPTY STOMACHWITH A GLASS OF WATER AT LEAST 30-60 MINBEFORE BREAKFAST 90 tablet 0   predniSONE (DELTASONE) 10 MG tablet 6 tablets on Day 1 , then reduce by 1 tablet daily until gone (Patient not taking: Reported on 04/19/2021) 21 tablet 0   triamcinolone (KENALOG) 0.025 % cream Apply 1 application topically 2 (two) times daily. (Patient not taking: Reported on 04/19/2021) 15 g 0   No facility-administered medications prior to visit.  Review of Systems  Patient denies headache, fevers, malaise, unintentional weight loss,  eye pain, sinus congestion and sinus pain, sore throat, dysphagia,  hemoptysis , cough, dyspnea, wheezing, chest pain, palpitations, orthopnea, edema, abdominal pain, nausea, melena, diarrhea, constipation, flank pain, dysuria, hematuria, urinary  Frequency, nocturia, seizures,  Focal weakness, Loss of consciousness,  Tremor, insomnia, depression, anxiety, and suicidal ideation.     Objective:  BP 122/74 (BP Location: Left Arm, Patient Position: Sitting, Cuff Size: Large)   Pulse 73   Temp (!) 96.7 F (35.9 C) (Temporal)   Resp 16    Ht 5\' 2"  (1.575 m)   Wt 214 lb 3.2 oz (97.2 kg)   SpO2 97%   BMI 39.18 kg/m   Physical Exam  General appearance: alert, cooperative and appears stated age Head: Normocephalic, without obvious abnormality, atraumatic Eyes: conjunctivae/corneas clear. PERRL, EOM's intact. Fundi benign. Ears: normal TM's and external ear canals both ears Nose: Nares normal. Septum midline. Mucosa normal. No drainage or sinus tenderness. Throat: lips, mucosa, and tongue normal; teeth and gums normal Neck: no adenopathy, no carotid bruit, no JVD, supple, symmetrical, trachea midline and thyroid not enlarged, symmetric, no tenderness/mass/nodules Lungs: clear to auscultation bilaterally Breasts: normal appearance, no masses or tenderness Heart: regular rate and rhythm, S1, S2 normal, no murmur, click, rub or gallop Abdomen: soft, non-tender; bowel sounds normal; no masses,  no organomegaly Extremities: extremities normal, atraumatic, no cyanosis or edema Pulses: 2+ and symmetric Skin: Skin color, texture, turgor normal. No rashes or lesions Neurologic: Alert and oriented X 3, normal strength and tone. Normal symmetric reflexes. Normal coordination and gait.     Assessment & Plan:   Problem List Items Addressed This Visit       Unprioritized   Abdominal aortic atherosclerosis ([place])    Sh eis tolerating rosuvastatin every other day ,  After not tolerating atorvastatin higher dose       Encounter for general adult medical examination with abnormal findings    age appropriate education and counseling updated, referrals for preventative services and immunizations addressed, dietary and smoking counseling addressed, most recent labs reviewed.  I have personally reviewed and have noted:   1) the patient's medical and social history 2) The pt's use of alcohol, tobacco, and illicit drugs 3) The patient's current medications and supplements 4) Functional ability including ADL's, fall risk, home safety risk,  hearing and visual impairment 5) Diet and physical activities 6) Evidence for depression or mood disorder 7) The patient's height, weight, and BMI have been recorded in the chart   I have made referrals, and provided counseling and education based on review of the above       Hyperlipidemia associated with type 2 diabetes mellitus ([place])    Her diabetes is Complicated by obesity and hyperlipidemia.   hemoglobin A1c is at goal of less than 7.0 . Patient continues to have a rash that has not changed with change in statin from atorvastatin to every other day rosuvastatin. Myalgias have improved however and she is willing to continue statin therapy, as her dermatologist feels that the rash is due to her COVID booster vaccination .  Starting ozempic She is  Up to date on annual eye exam and foot exam is normal today. Patient has no microalbuminuria. Patient is tolerating statin therapy for CAD risk reduction and on ACE/ARB for renal protection and hypertension   Lab Results  Component Value Date   HGBA1C 6.3 04/13/2021   Lab Results  Component Value Date  LABMICR <3.0 03/06/2017   MICROALBUR 1.6 04/13/2021   MICROALBUR 1.2 07/05/2020     Lab Results  Component Value Date   CHOL 168 01/09/2021   HDL 58.20 01/09/2021   LDLCALC 80 01/09/2021   TRIG 152.0 (H) 01/09/2021   CHOLHDL 3 01/09/2021          Relevant Medications   Semaglutide,0.25 or 0.5MG /DOS, (OZEMPIC, 0.25 OR 0.5 MG/DOSE,) 2 MG/1.5ML SOPN   Hypothyroidism (acquired)    Thyroid function is WNL on current 50 mcg levothyroxine. dose.  No current changes needed.   Lab Results  Component Value Date   TSH 2.57 01/09/2021          Relevant Medications   levothyroxine (SYNTHROID) 50 MCG tablet   Obesity    Starting ozempic for control of weight and diabetes.  0.25 mg dose        Relevant Medications   Semaglutide,0.25 or 0.5MG /DOS, (OZEMPIC, 0.25 OR 0.5 MG/DOSE,) 2 MG/1.5ML SOPN    I have discontinued Zala  H. Sagraves's triamcinolone and predniSONE. I have also changed her levothyroxine. Additionally, I am having her start on Ozempic (0.25 or 0.5 MG/DOSE). Lastly, I am having her maintain her Glucosamine Sulfate, Osteo Bi-Flex Joint Shield, Vitamin D3, aspirin, cetirizine, vitamin C, CoQ-10, senna, Olopatadine HCl (PATADAY OP), Polyvinyl Alcohol-Povidone (REFRESH OP), letrozole, furosemide, rOPINIRole, sertraline, and rosuvastatin.  Meds ordered this encounter  Medications   levothyroxine (SYNTHROID) 50 MCG tablet    Sig: Take 1 tablet (50 mcg total) by mouth daily before breakfast.    Dispense:  90 tablet    Refill:  0   Semaglutide,0.25 or 0.5MG /DOS, (OZEMPIC, 0.25 OR 0.5 MG/DOSE,) 2 MG/1.5ML SOPN    Sig: Inject 0.5 mg into the skin once a week.    Dispense:  1.5 mL    Refill:  0    Medications Discontinued During This Encounter  Medication Reason   predniSONE (DELTASONE) 10 MG tablet Completed Course   triamcinolone (KENALOG) 0.025 % cream Completed Course   levothyroxine (SYNTHROID) 50 MCG tablet Reorder    Follow-up: Return in about 3 months (around 07/20/2021) for follow up diabetes.   Crecencio Mc, MD

## 2021-04-19 NOTE — Patient Instructions (Addendum)
  I am PRESCRIBING an injectable medication to help curb your appetite,  It is called Ozempic.  It slows down your stomach emptying so you stay full longer . Your fist dose was today.  0.25 MG  , WHICH IS THE LOWEST DOSE.    Continue the 0.25 mg dose for AT LEAST 4 WEEKLY DOSES.  IF YOUR WEIGHT  HAS PLATEAUED AT THE END OF 4 WEEKS YOU MAY INCREASE YOUR DOSE TO 0.5 MG .  IF YOU ARE STILL LOSING WEIGHT AFTER 4 WEEKS OF THE LOWEST DOSE (0.25 MG ) ,  STAY ON THE LOWEST DOSE

## 2021-04-22 NOTE — Assessment & Plan Note (Signed)
Starting ozempic for control of weight and diabetes.  0.25 mg dose

## 2021-04-22 NOTE — Assessment & Plan Note (Signed)

## 2021-04-22 NOTE — Assessment & Plan Note (Addendum)
Sh eis tolerating rosuvastatin every other day ,  After not tolerating atorvastatin higher dose

## 2021-04-22 NOTE — Assessment & Plan Note (Addendum)
Thyroid function is WNL on current 50 mcg levothyroxine. dose.  No current changes needed.   Lab Results  Component Value Date   TSH 2.57 01/09/2021

## 2021-04-22 NOTE — Assessment & Plan Note (Addendum)
Her diabetes is Complicated by obesity and hyperlipidemia.   hemoglobin A1c is at goal of less than 7.0 . Patient continues to have a rash that has not changed with change in statin from atorvastatin to every other day rosuvastatin. Myalgias have improved however and she is willing to continue statin therapy, as her dermatologist feels that the rash is due to her COVID booster vaccination .  Starting ozempic She is  Up to date on annual eye exam and foot exam is normal today. Patient has no microalbuminuria. Patient is tolerating statin therapy for CAD risk reduction and on ACE/ARB for renal protection and hypertension   Lab Results  Component Value Date   HGBA1C 6.3 04/13/2021   Lab Results  Component Value Date   LABMICR <3.0 03/06/2017   MICROALBUR 1.6 04/13/2021   MICROALBUR 1.2 07/05/2020     Lab Results  Component Value Date   CHOL 168 01/09/2021   HDL 58.20 01/09/2021   LDLCALC 80 01/09/2021   TRIG 152.0 (H) 01/09/2021   CHOLHDL 3 01/09/2021

## 2021-04-27 LAB — HM DIABETES EYE EXAM

## 2021-05-22 ENCOUNTER — Other Ambulatory Visit: Payer: Self-pay | Admitting: Internal Medicine

## 2021-05-29 ENCOUNTER — Other Ambulatory Visit: Payer: Self-pay

## 2021-05-29 ENCOUNTER — Ambulatory Visit (INDEPENDENT_AMBULATORY_CARE_PROVIDER_SITE_OTHER): Payer: Medicare Other

## 2021-05-29 DIAGNOSIS — M85852 Other specified disorders of bone density and structure, left thigh: Secondary | ICD-10-CM

## 2021-05-29 MED ORDER — DENOSUMAB 60 MG/ML ~~LOC~~ SOSY
60.0000 mg | PREFILLED_SYRINGE | Freq: Once | SUBCUTANEOUS | Status: AC
  Administered 2021-05-29: 60 mg via SUBCUTANEOUS

## 2021-05-29 NOTE — Progress Notes (Signed)
Pt presented today for a prolia injection. Left deltoid, SQ. Pt voiced no concerns nor showed any signs of distress.

## 2021-05-30 ENCOUNTER — Telehealth: Payer: Self-pay | Admitting: Family

## 2021-05-30 NOTE — Telephone Encounter (Signed)
FYI Carol [name],   Call pt  Patient came in for her Prolia injection yesterday, I co signed order.  She is due for vit D lab.  Would you mind ordering and scheduling for patient?

## 2021-06-01 NOTE — Telephone Encounter (Signed)
LMTCB

## 2021-06-02 NOTE — Telephone Encounter (Signed)
Pt stated that she would like to wait to have this done when she comes in for her next appt on 07/20/2021.

## 2021-06-02 NOTE — Telephone Encounter (Signed)
Pt returned your call.  

## 2021-07-20 ENCOUNTER — Ambulatory Visit: Payer: Medicare Other | Admitting: Internal Medicine

## 2021-07-20 ENCOUNTER — Encounter: Payer: Self-pay | Admitting: Internal Medicine

## 2021-07-20 ENCOUNTER — Other Ambulatory Visit: Payer: Self-pay

## 2021-07-20 VITALS — BP 120/72 | HR 83 | Temp 96.0°F | Ht 62.0 in | Wt 216.4 lb

## 2021-07-20 DIAGNOSIS — Z23 Encounter for immunization: Secondary | ICD-10-CM | POA: Diagnosis not present

## 2021-07-20 DIAGNOSIS — E039 Hypothyroidism, unspecified: Secondary | ICD-10-CM

## 2021-07-20 DIAGNOSIS — E559 Vitamin D deficiency, unspecified: Secondary | ICD-10-CM | POA: Diagnosis not present

## 2021-07-20 DIAGNOSIS — E1169 Type 2 diabetes mellitus with other specified complication: Secondary | ICD-10-CM

## 2021-07-20 DIAGNOSIS — I5043 Acute on chronic combined systolic (congestive) and diastolic (congestive) heart failure: Secondary | ICD-10-CM

## 2021-07-20 DIAGNOSIS — W57XXXS Bitten or stung by nonvenomous insect and other nonvenomous arthropods, sequela: Secondary | ICD-10-CM

## 2021-07-20 DIAGNOSIS — L299 Pruritus, unspecified: Secondary | ICD-10-CM

## 2021-07-20 DIAGNOSIS — E785 Hyperlipidemia, unspecified: Secondary | ICD-10-CM

## 2021-07-20 LAB — COMPREHENSIVE METABOLIC PANEL
ALT: 11 U/L (ref 0–35)
AST: 14 U/L (ref 0–37)
Albumin: 4.4 g/dL (ref 3.5–5.2)
Alkaline Phosphatase: 56 U/L (ref 39–117)
BUN: 8 mg/dL (ref 6–23)
CO2: 33 mEq/L — ABNORMAL HIGH (ref 19–32)
Calcium: 9.4 mg/dL (ref 8.4–10.5)
Chloride: 101 mEq/L (ref 96–112)
Creatinine, Ser: 0.86 mg/dL (ref 0.40–1.20)
GFR: 66.6 mL/min (ref >60.00)
Glucose, Bld: 121 mg/dL — ABNORMAL HIGH (ref 70–99)
Potassium: 4 mEq/L (ref 3.5–5.1)
Sodium: 142 mEq/L (ref 135–145)
Total Bilirubin: 0.7 mg/dL (ref 0.2–1.2)
Total Protein: 6.7 g/dL (ref 6.0–8.3)

## 2021-07-20 LAB — LIPID PANEL
Cholesterol: 140 mg/dL (ref 0–200)
HDL: 58.4 mg/dL (ref >39.00)
LDL Cholesterol: 56 mg/dL (ref 0–99)
NonHDL: 81.26
Total CHOL/HDL Ratio: 2
Triglycerides: 128 mg/dL (ref 0.0–149.0)
VLDL: 25.6 mg/dL (ref 0.0–40.0)

## 2021-07-20 LAB — VITAMIN D 25 HYDROXY (VIT D DEFICIENCY, FRACTURES): VITD: 49.54 ng/mL (ref 30.00–100.00)

## 2021-07-20 LAB — TSH: TSH: 2.25 u[IU]/mL (ref 0.35–5.50)

## 2021-07-20 LAB — HEMOGLOBIN A1C: Hgb A1c MFr Bld: 6.4 % (ref 4.6–6.5)

## 2021-07-20 NOTE — Patient Instructions (Signed)
Please start walking for exercise. Start small ,  15 minutes  and work your way up to 30 minutes   Please consider a repeat trial of Ozempic ( or Mounjaro)  once your house is back to normal if you have been unable to lose weight on your own

## 2021-07-20 NOTE — Assessment & Plan Note (Signed)
Finally resolved.multiple etiologies considered.  After bedbugs were found and exterminated this Spring

## 2021-07-20 NOTE — Progress Notes (Signed)
Subjective:  Patient ID: Carol [name], female    DOB: [birth year]  Age: 75 y.o. MRN: [id number]  CC: The primary encounter diagnosis was Hyperlipidemia associated with type 2 diabetes mellitus ([place]). Diagnoses of Pruritic rash determined by examination, Hypothyroidism (acquired), Vitamin D deficiency, Need for immunization against influenza, Acute on chronic combined systolic and diastolic heart failure ([place]), Morbid obesity ([place]), and Bedbug bite, sequela were also pertinent to this visit.  HPI Peyten Punches presents for  Chief Complaint  Patient presents with   Follow-up    3 month follow up on diabetes    This visit occurred during the SARS-CoV-2 public health emergency.  Safety protocols were in place, including screening questions prior to the visit, additional usage of staff PPE, and extensive cleaning of exam room while observing appropriate contact time as indicated for disinfecting solutions.   T2DM with obesity, hyperlipidemia and CAD:   she is controlled her diabetes with diet .  She was prescribed  Ozempic but stopped it after 4 weeks because she had not lost any weight .  She denied side effects.  She is not interested int resuming or g another medication currently .  She is not walking due to chronic back pain.  She is still sharing her home with her sister and feels that her motivation will improve once she has the house to herself again.  Had CTS surgery left hand this summer: unfortunately she has had no improvement In feeling and function in the left hand.  Right hand had improved  after right sided CTS was done  in 2021.    History of back surgery last year. Occasional radiation to left leg.  Pain is aggravated by standing In  one position   Osteoporosis  taking vit d 2 or 3 0000,  getting prolia injections   Had extermination of bed bugs this summer that had infested her sunroom/  legs still covered in resolving bites.      Outpatient Medications  Prior to Visit  Medication Sig Dispense Refill   Ascorbic Acid (VITAMIN C) 1000 MG tablet Take 1,000 mg by mouth daily.      aspirin 81 MG tablet Take 81 mg by mouth daily.      cetirizine (ZYRTEC) 10 MG tablet Take 10 mg by mouth every morning.      Cholecalciferol (VITAMIN D3) 2000 UNITS capsule Take 2,000 Units by mouth daily.       Coenzyme Q10 (COQ-10) 200 MG CAPS Take 200 mg by mouth daily.     furosemide (LASIX) 20 MG tablet TAKE ONE TABLET TWICE DAILY 180 tablet 3   Glucosamine Sulfate 1000 MG CAPS Take 1,000 mg by mouth daily.     letrozole (FEMARA) 2.5 MG tablet TAKE ONE TABLET EVERY DAY 90 tablet 3   levothyroxine (SYNTHROID) 50 MCG tablet TAKE 1 TABLET EVERY DAY ON EMPTY STOMACHWITH A GLASS OF WATER AT LEAST 30-60 MINBEFORE BREAKFAST 90 tablet 0   Misc Natural Products (OSTEO BI-FLEX JOINT SHIELD) TABS Take 1 tablet by mouth daily.      Olopatadine HCl (PATADAY OP) Place 1 drop into both eyes daily as needed (allergies).     Polyvinyl Alcohol-Povidone (REFRESH OP) Place 1 drop into both eyes daily as needed (dry eyes).     rOPINIRole (REQUIP) 0.5 MG tablet TAKE ONE TABLET AT BEDTIME 90 tablet 3   senna (SENOKOT) 8.6 MG tablet Take 12 tablets by mouth at bedtime.      sertraline (ZOLOFT) 50  MG tablet TAKE 1 TABLET BY MOUTH DAILY 90 tablet 3   atorvastatin (LIPITOR) 40 MG tablet Take 40 mg by mouth daily.     rosuvastatin (CRESTOR) 20 MG tablet Take 1 tablet (20 mg total) by mouth every other day. (Patient not taking: Reported on 07/20/2021) 45 tablet 2   Semaglutide,0.25 or 0.5MG /DOS, (OZEMPIC, 0.25 OR 0.5 MG/DOSE,) 2 MG/1.5ML SOPN Inject 0.5 mg into the skin once a week. (Patient not taking: Reported on 07/20/2021) 1.5 mL 0   No facility-administered medications prior to visit.    Review of Systems;  Patient denies headache, fevers, malaise, unintentional weight loss, worsening skin rash, eye pain, sinus congestion and sinus pain, sore throat, dysphagia,  hemoptysis , cough,  dyspnea, wheezing, chest pain, palpitations, orthopnea, edema, abdominal pain, nausea, melena, diarrhea, constipation, flank pain, dysuria, hematuria, urinary  Frequency, nocturia, numbness, tingling, seizures,  Focal weakness, Loss of consciousness,  Tremor, insomnia, depression, anxiety, and suicidal ideation.      Objective:  BP 120/72 (BP Location: Left Arm, Patient Position: Sitting, Cuff Size: Large)   Pulse 83   Temp (!) 96 F (35.6 C) (Temporal)   Ht 5\' 2"  (1.575 m)   Wt 216 lb 6.4 oz (98.2 kg)   SpO2 96%   BMI 39.58 kg/m   BP Readings from Last 3 Encounters:  07/20/21 120/72  04/19/21 122/74  02/20/21 134/68    Wt Readings from Last 3 Encounters:  07/20/21 216 lb 6.4 oz (98.2 kg)  04/19/21 214 lb 3.2 oz (97.2 kg)  02/20/21 211 lb 9.6 oz (96 kg)    General appearance: alert, cooperative and appears stated age Ears: normal TM's and external ear canals both ears Throat: lips, mucosa, and tongue normal; teeth and gums normal Neck: no adenopathy, no carotid bruit, supple, symmetrical, trachea midline and thyroid not enlarged, symmetric, no tenderness/mass/nodules Back: symmetric, no curvature. ROM normal. No CVA tenderness. Lungs: clear to auscultation bilaterally Heart: regular rate and rhythm, S1, S2 normal, no murmur, click, rub or gallop Abdomen: soft, non-tender; bowel sounds normal; no masses,  no organomegaly Pulses: 2+ and symmetric Skin: Skin color, texture, turgor normal. Resolving macular rash covering lower extremities   Lymph nodes: Cervical, supraclavicular, and axillary nodes normal.  Lab Results  Component Value Date   HGBA1C 6.4 07/20/2021   HGBA1C 6.3 04/13/2021   HGBA1C 6.1 01/09/2021    Lab Results  Component Value Date   CREATININE 0.86 07/20/2021   CREATININE 0.95 04/13/2021   CREATININE 0.80 01/09/2021    Lab Results  Component Value Date   WBC 9.1 01/09/2021   HGB 14.6 01/09/2021   HCT 43.3 01/09/2021   PLT 201.0 01/09/2021    GLUCOSE 121 (H) 07/20/2021   CHOL 140 07/20/2021   TRIG 128.0 07/20/2021   HDL 58.40 07/20/2021   LDLCALC 56 07/20/2021   ALT 11 07/20/2021   AST 14 07/20/2021   NA 142 07/20/2021   K 4.0 07/20/2021   CL 101 07/20/2021   CREATININE 0.86 07/20/2021   BUN 8 07/20/2021   CO2 33 (H) 07/20/2021   TSH 2.25 07/20/2021   HGBA1C 6.4 07/20/2021   MICROALBUR 1.6 04/13/2021    US THYROID  Result Date: 02/08/2021 CLINICAL DATA:  Neck mass Goiter? EXAM: THYROID ULTRASOUND TECHNIQUE: Ultrasound examination of the thyroid gland and adjacent soft tissues was performed. COMPARISON:  None. FINDINGS: Parenchymal Echotexture: Mildly heterogeneous Isthmus: 0.4 cm Right lobe: 3.7 x 1.4 x 1.4 cm Left lobe: 4.1 x 1.6 x 1.3 cm _________________________________________________________ Estimated  total number of nodules >/= 1 cm: 0 Number of spongiform nodules >/=  2 cm not described below (TR1): 0 Number of mixed cystic and solid nodules >/= 1.5 cm not described below (TR2): 0 _________________________________________________________ No discrete nodules are seen within the thyroid gland. IMPRESSION: 1. Mild diffuse heterogeneity of the thyroid without discrete nodule. 2. No discrete sonographic finding identified on targeted evaluation of the area of palpable abnormality in the right neck. The above is in keeping with the ACR TI-RADS recommendations - J Am Coll Radiol 2017;14:587-595. Electronically Signed   By: Miachel Roux M.D.   On: 02/08/2021 14:36    Assessment & Plan:   Problem List Items Addressed This Visit       Unprioritized   Acute on chronic combined systolic and diastolic heart failure ([place])    encouraged to resume a walking program and a GLP 1  Agonist.       Relevant Medications   atorvastatin (LIPITOR) 40 MG tablet   Bedbug bite    Her recently reported rash has finally been determined to be secondary to bedbug bites, as these were discovered and exterminated.       Hyperlipidemia  associated with type 2 diabetes mellitus ([place]) - Primary    Diet controlled, but her morbid obesity remains problematic.  Encouraged to resume Ozempic or Mounjaro  . She has resumed atorvastatin.  She has no microalbuinuria   Lab Results  Component Value Date   HGBA1C 6.4 07/20/2021   Lab Results  Component Value Date   LABMICR <3.0 03/06/2017   MICROALBUR 1.6 04/13/2021   MICROALBUR 1.2 07/05/2020   Lab Results  Component Value Date   CHOL 140 07/20/2021   HDL 58.40 07/20/2021   LDLCALC 56 07/20/2021   TRIG 128.0 07/20/2021   CHOLHDL 2 07/20/2021           Relevant Medications   atorvastatin (LIPITOR) 40 MG tablet   Other Relevant Orders   Hemoglobin A1c (Completed)   Comprehensive metabolic panel (Completed)   Lipid panel (Completed)   Hypothyroidism (acquired)   Relevant Orders   TSH (Completed)   Morbid obesity ([place])    Due to concurrent  Type 2 DM, hypertension and hyperlipidemia       Pruritic rash determined by examination    Finally resolved.multiple etiologies considered.  After bedbugs were found and exterminated this Spring       Other Visit Diagnoses     Vitamin D deficiency       Relevant Orders   VITAMIN D 25 Hydroxy (Vit-D Deficiency, Fractures) (Completed)   Need for immunization against influenza       Relevant Orders   Flu Vaccine QUAD 71mo+IM (Fluarix, Fluzone & Alfiuria Quad PF) (Completed)       Meds ordered this encounter  Medications   atorvastatin (LIPITOR) 40 MG tablet    Sig: Take 1 tablet (40 mg total) by mouth daily.    Dispense:  90 tablet    Refill:  1    Medications Discontinued During This Encounter  Medication Reason   Semaglutide,0.25 or 0.5MG /DOS, (OZEMPIC, 0.25 OR 0.5 MG/DOSE,) 2 MG/1.5ML SOPN    rosuvastatin (CRESTOR) 20 MG tablet    atorvastatin (LIPITOR) 40 MG tablet Reorder    Follow-up: Return in about 3 months (around 10/20/2021) for follow up diabetes.   Crecencio Mc, MD

## 2021-07-22 DIAGNOSIS — W57XXXA Bitten or stung by nonvenomous insect and other nonvenomous arthropods, initial encounter: Secondary | ICD-10-CM

## 2021-07-22 HISTORY — DX: Bitten or stung by nonvenomous insect and other nonvenomous arthropods, initial encounter: W57.XXXA

## 2021-07-22 MED ORDER — ATORVASTATIN CALCIUM 40 MG PO TABS: 40.0000 mg | ORAL_TABLET | Freq: Every day | ORAL | 1 refills | Status: DC

## 2021-07-22 NOTE — Assessment & Plan Note (Signed)
encouraged to resume a walking program and a GLP 1  Agonist.

## 2021-07-22 NOTE — Assessment & Plan Note (Addendum)
Diet controlled, but her morbid obesity remains problematic.  Encouraged to resume Ozempic or Mounjaro  . She has resumed atorvastatin.  She has no microalbuinuria   Lab Results  Component Value Date   HGBA1C 6.4 07/20/2021   Lab Results  Component Value Date   LABMICR <3.0 03/06/2017   MICROALBUR 1.6 04/13/2021   MICROALBUR 1.2 07/05/2020   Lab Results  Component Value Date   CHOL 140 07/20/2021   HDL 58.40 07/20/2021   LDLCALC 56 07/20/2021   TRIG 128.0 07/20/2021   CHOLHDL 2 07/20/2021

## 2021-07-22 NOTE — Assessment & Plan Note (Signed)
Due to concurrent  Type 2 DM, hypertension and hyperlipidemia

## 2021-07-22 NOTE — Assessment & Plan Note (Signed)
Her recently reported rash has finally been determined to be secondary to bedbug bites, as these were discovered and exterminated.

## 2021-08-23 ENCOUNTER — Other Ambulatory Visit: Payer: Self-pay | Admitting: General Surgery

## 2021-08-23 DIAGNOSIS — C50411 Malignant neoplasm of upper-outer quadrant of right female breast: Secondary | ICD-10-CM

## 2021-08-31 ENCOUNTER — Other Ambulatory Visit: Payer: Self-pay | Admitting: Internal Medicine

## 2021-08-31 DIAGNOSIS — G2581 Restless legs syndrome: Secondary | ICD-10-CM

## 2021-08-31 DIAGNOSIS — I878 Other specified disorders of veins: Secondary | ICD-10-CM

## 2021-10-03 ENCOUNTER — Ambulatory Visit
Admission: RE | Admit: 2021-10-03 | Discharge: 2021-10-03 | Disposition: A | Discharge status: Home / Self Care | Payer: Medicare Other | Source: Ambulatory Visit | Attending: General Surgery | Admitting: General Surgery

## 2021-10-03 ENCOUNTER — Other Ambulatory Visit: Payer: Medicare Other

## 2021-10-03 ENCOUNTER — Other Ambulatory Visit: Payer: Self-pay

## 2021-10-03 DIAGNOSIS — C50411 Malignant neoplasm of upper-outer quadrant of right female breast: Secondary | ICD-10-CM

## 2021-10-17 ENCOUNTER — Telehealth: Payer: Self-pay | Admitting: Internal Medicine

## 2021-10-17 NOTE — Telephone Encounter (Signed)
Patient called and has tested positive for COVID. She did not want to be triaged or make an appointment. She would like to know what she should do. Also, Patient made an appt in April and would like to do labs before appt. No labs are ordered.

## 2021-10-18 NOTE — Telephone Encounter (Signed)
Spoke with pt and she stated that she just wanted to let Dr. Derrel Nip know that she tested positive for covid yesterday morning. Pt stated that started just feeling "yucky" on Saturday and then by Sunday she had developed sinus drainage which is clear, and a productive cough, which had a little yellow tinge to it this morning. Pt stated that she has not had a fever, no chills, no body aches, no headaches, no SOBr. Pt is taking Quercetin. Pt stated that if she symptoms progressed she would call back to let us know.

## 2021-10-20 ENCOUNTER — Ambulatory Visit: Payer: Medicare Other | Admitting: Internal Medicine

## 2021-11-16 ENCOUNTER — Telehealth: Payer: Self-pay | Admitting: Internal Medicine

## 2021-11-16 NOTE — Telephone Encounter (Signed)
Einar Grad Key: Valorie Roosevelt - PA Case ID: [id number] Need help? Call us at [phone] Outcome Additional Information Required This medication or product is on your plan's list of covered drugs. Prior authorization is not required at this time. If your pharmacy has questions regarding the processing of your prescription, please have them call the [facility] pharmacy help desk at [phone]. **Please note: This request was submitted electronically. Formulary lowering, tiering exception, cost reduction and/or pre-benefit determination review (including prospective Medicare hospice reviews) requests cannot be requested using this method of submission. Providers contact us at [phone] for further assistance.

## 2021-11-16 NOTE — Telephone Encounter (Signed)
Patient scheduled.

## 2021-11-29 ENCOUNTER — Other Ambulatory Visit: Payer: Self-pay

## 2021-11-29 ENCOUNTER — Ambulatory Visit (INDEPENDENT_AMBULATORY_CARE_PROVIDER_SITE_OTHER): Payer: Medicare Other | Admitting: *Deleted

## 2021-11-29 DIAGNOSIS — M85852 Other specified disorders of bone density and structure, left thigh: Secondary | ICD-10-CM

## 2021-11-29 MED ORDER — DENOSUMAB 60 MG/ML ~~LOC~~ SOSY
60.0000 mg | PREFILLED_SYRINGE | Freq: Once | SUBCUTANEOUS | Status: AC
  Administered 2021-11-29: 60 mg via SUBCUTANEOUS

## 2021-11-29 NOTE — Progress Notes (Signed)
Pt arrived for Prolia injection, given in the L arm pt had no complaints or signs of distress.

## 2021-12-27 ENCOUNTER — Ambulatory Visit (INDEPENDENT_AMBULATORY_CARE_PROVIDER_SITE_OTHER): Payer: Medicare Other

## 2021-12-27 VITALS — Ht 62.0 in | Wt 216.0 lb

## 2021-12-27 DIAGNOSIS — Z0000 Encounter for general adult medical examination without abnormal findings: Secondary | ICD-10-CM | POA: Diagnosis not present

## 2021-12-27 NOTE — Patient Instructions (Addendum)
Carol [name] , Thank you for taking time to come for your Medicare Wellness Visit. I appreciate your ongoing commitment to your health goals. Please review the following plan we discussed and let me know if I can assist you in the future.   These are the goals we discussed:  Goals      Increase physical activity     As tolerated.        This is a list of the screening recommended for you and due dates:  Health Maintenance  Topic Date Due   COVID-19 Vaccine (4 - Booster for Moderna series) 01/12/2022*   Tetanus Vaccine  12/28/2022*   Hemoglobin A1C  01/18/2022   Urine Protein Check  04/13/2022   Eye exam for diabetics  04/27/2022   Complete foot exam   07/20/2022   Mammogram  10/03/2022   Colon Cancer Screening  03/19/2028   Pneumonia Vaccine  Completed   Flu Shot  Completed   DEXA scan (bone density measurement)  Completed   Hepatitis C Screening: USPSTF Recommendation to screen - Ages 66-79 yo.  Completed   Zoster (Shingles) Vaccine  Completed   HPV Vaccine  Aged Out  *Topic was postponed. The date shown is not the original due date.    Advanced directives: on file  Conditions/risks identified: none new   Follow up in one year for your annual wellness visit    Preventive Care 65 Years and Older, Female Preventive care refers to lifestyle choices and visits with your health care provider that can promote health and wellness. What does preventive care include? A yearly physical exam. This is also called an annual well check. Dental exams once or twice a year. Routine eye exams. Ask your health care provider how often you should have your eyes checked. Personal lifestyle choices, including: Daily care of your teeth and gums. Regular physical activity. Eating a healthy diet. Avoiding tobacco and drug use. Limiting alcohol use. Practicing safe sex. Taking low-dose aspirin every day. Taking vitamin and mineral supplements as recommended by your health care  provider. What happens during an annual well check? The services and screenings done by your health care provider during your annual well check will depend on your age, overall health, lifestyle risk factors, and family history of disease. Counseling  Your health care provider may ask you questions about your: Alcohol use. Tobacco use. Drug use. Emotional well-being. Home and relationship well-being. Sexual activity. Eating habits. History of falls. Memory and ability to understand (cognition). Work and work Astronomer. Reproductive health. Screening  You may have the following tests or measurements: Height, weight, and BMI. Blood pressure. Lipid and cholesterol levels. These may be checked every 5 years, or more frequently if you are over 39 years old. Skin check. Lung cancer screening. You may have this screening every year starting at age 4 if you have a 30-pack-year history of smoking and currently smoke or have quit within the past 15 years. Fecal occult blood test (FOBT) of the stool. You may have this test every year starting at age 60. Flexible sigmoidoscopy or colonoscopy. You may have a sigmoidoscopy every 5 years or a colonoscopy every 10 years starting at age 46. Hepatitis C blood test. Hepatitis B blood test. Sexually transmitted disease (STD) testing. Diabetes screening. This is done by checking your blood sugar (glucose) after you have not eaten for a while (fasting). You may have this done every 1-3 years. Bone density scan. This is done to screen for osteoporosis. You  may have this done starting at age 108. Mammogram. This may be done every 1-2 years. Talk to your health care provider about how often you should have regular mammograms. Talk with your health care provider about your test results, treatment options, and if necessary, the need for more tests. Vaccines  Your health care provider may recommend certain vaccines, such as: Influenza vaccine. This is  recommended every year. Tetanus, diphtheria, and acellular pertussis (Tdap, Td) vaccine. You may need a Td booster every 10 years. Zoster vaccine. You may need this after age 25. Pneumococcal 13-valent conjugate (PCV13) vaccine. One dose is recommended after age 18. Pneumococcal polysaccharide (PPSV23) vaccine. One dose is recommended after age 29. Talk to your health care provider about which screenings and vaccines you need and how often you need them. This information is not intended to replace advice given to you by your health care provider. Make sure you discuss any questions you have with your health care provider. Document Released: 10/28/2015 Document Revised: 06/20/2016 Document Reviewed: 08/02/2015 Elsevier Interactive Patient Education  2017 [employer].  Fall Prevention in the Home Falls can cause injuries. They can happen to people of all ages. There are many things you can do to make your home safe and to help prevent falls. What can I do on the outside of my home? Regularly fix the edges of walkways and driveways and fix any cracks. Remove anything that might make you trip as you walk through a door, such as a raised step or threshold. Trim any bushes or trees on the path to your home. Use bright outdoor lighting. Clear any walking paths of anything that might make someone trip, such as rocks or tools. Regularly check to see if handrails are loose or broken. Make sure that both sides of any steps have handrails. Any raised decks and porches should have guardrails on the edges. Have any leaves, snow, or ice cleared regularly. Use sand or salt on walking paths during winter. Clean up any spills in your garage right away. This includes oil or grease spills. What can I do in the bathroom? Use night lights. Install grab bars by the toilet and in the tub and shower. Do not use towel bars as grab bars. Use non-skid mats or decals in the tub or shower. If you need to sit down in  the shower, use a plastic, non-slip stool. Keep the floor dry. Clean up any water that spills on the floor as soon as it happens. Remove soap buildup in the tub or shower regularly. Attach bath mats securely with double-sided non-slip rug tape. Do not have throw rugs and other things on the floor that can make you trip. What can I do in the bedroom? Use night lights. Make sure that you have a light by your bed that is easy to reach. Do not use any sheets or blankets that are too big for your bed. They should not hang down onto the floor. Have a firm chair that has side arms. You can use this for support while you get dressed. Do not have throw rugs and other things on the floor that can make you trip. What can I do in the kitchen? Clean up any spills right away. Avoid walking on wet floors. Keep items that you use a lot in easy-to-reach places. If you need to reach something above you, use a strong step stool that has a grab bar. Keep electrical cords out of the way. Do not use floor  polish or wax that makes floors slippery. If you must use wax, use non-skid floor wax. Do not have throw rugs and other things on the floor that can make you trip. What can I do with my stairs? Do not leave any items on the stairs. Make sure that there are handrails on both sides of the stairs and use them. Fix handrails that are broken or loose. Make sure that handrails are as long as the stairways. Check any carpeting to make sure that it is firmly attached to the stairs. Fix any carpet that is loose or worn. Avoid having throw rugs at the top or bottom of the stairs. If you do have throw rugs, attach them to the floor with carpet tape. Make sure that you have a light switch at the top of the stairs and the bottom of the stairs. If you do not have them, ask someone to add them for you. What else can I do to help prevent falls? Wear shoes that: Do not have high heels. Have rubber bottoms. Are comfortable  and fit you well. Are closed at the toe. Do not wear sandals. If you use a stepladder: Make sure that it is fully opened. Do not climb a closed stepladder. Make sure that both sides of the stepladder are locked into place. Ask someone to hold it for you, if possible. Clearly mark and make sure that you can see: Any grab bars or handrails. First and last steps. Where the edge of each step is. Use tools that help you move around (mobility aids) if they are needed. These include: Canes. Walkers. Scooters. Crutches. Turn on the lights when you go into a dark area. Replace any light bulbs as soon as they burn out. Set up your furniture so you have a clear path. Avoid moving your furniture around. If any of your floors are uneven, fix them. If there are any pets around you, be aware of where they are. Review your medicines with your doctor. Some medicines can make you feel dizzy. This can increase your chance of falling. Ask your doctor what other things that you can do to help prevent falls. This information is not intended to replace advice given to you by your health care provider. Make sure you discuss any questions you have with your health care provider. Document Released: 07/28/2009 Document Revised: 03/08/2016 Document Reviewed: 11/05/2014 Elsevier Interactive Patient Education  2017 [employer].

## 2021-12-27 NOTE — Progress Notes (Signed)
Subjective:   Carol [name] is a 76 y.o. female who presents for Medicare Annual (Subsequent) preventive examination.  Review of Systems    No ROS.  Medicare Wellness Virtual Visit.  Visual/audio telehealth visit, UTA vital signs.   See social history for additional risk factors.   Cardiac Risk Factors include: advanced age (>74men, >78 women);diabetes mellitus     Objective:    Today's Vitals   12/27/21 1034  Weight: 216 lb (98 kg)  Height: 5\' 2"  (1.575 m)   Body mass index is 39.51 kg/m.  Advanced Directives 12/27/2021 12/26/2020 07/25/2020 07/19/2020 05/15/2020 12/24/2019 09/03/2019  Does Patient Have a Medical Advance Directive? Yes Yes Yes Yes No Yes No  Type of Estate agent of [place];Living will Healthcare Power of [place];Living will Living will Living will - Healthcare Power of [place];Living will -  Does patient want to make changes to medical advance directive? No - Patient declined No - Patient declined - - - No - Patient declined -  Copy of Healthcare Power of Attorney in Chart? Yes - validated most recent copy scanned in chart (See row information) Yes - validated most recent copy scanned in chart (See row information) - - - Yes - validated most recent copy scanned in chart (See row information) -  Would patient like information on creating a medical advance directive? - - - - - - No - Patient declined    Current Medications (verified) Outpatient Encounter Medications as of 12/27/2021  Medication Sig   Ascorbic Acid (VITAMIN C) 1000 MG tablet Take 1,000 mg by mouth daily.    aspirin 81 MG tablet Take 81 mg by mouth daily.    atorvastatin (LIPITOR) 40 MG tablet Take 1 tablet (40 mg total) by mouth daily.   cetirizine (ZYRTEC) 10 MG tablet Take 10 mg by mouth every morning.    Cholecalciferol (VITAMIN D3) 2000 UNITS capsule Take 2,000 Units by mouth daily.     Coenzyme Q10 (COQ-10) 200 MG CAPS Take 200 mg by mouth daily.   furosemide  (LASIX) 20 MG tablet TAKE ONE TABLET TWICE DAILY   Glucosamine Sulfate 1000 MG CAPS Take 1,000 mg by mouth daily.   letrozole (FEMARA) 2.5 MG tablet TAKE ONE TABLET EVERY DAY   levothyroxine (SYNTHROID) 50 MCG tablet TAKE 1 TABLET EVERY DAY ON EMPTY STOMACHWITH A GLASS OF WATER AT LEAST 30-60 MINBEFORE BREAKFAST   Misc Natural Products (OSTEO BI-FLEX JOINT SHIELD) TABS Take 1 tablet by mouth daily.    Olopatadine HCl (PATADAY OP) Place 1 drop into both eyes daily as needed (allergies).   Polyvinyl Alcohol-Povidone (REFRESH OP) Place 1 drop into both eyes daily as needed (dry eyes).   rOPINIRole (REQUIP) 0.5 MG tablet TAKE ONE TABLET AT BEDTIME   senna (SENOKOT) 8.6 MG tablet Take 12 tablets by mouth at bedtime.    sertraline (ZOLOFT) 50 MG tablet TAKE 1 TABLET BY MOUTH DAILY   No facility-administered encounter medications on file as of 12/27/2021.    Allergies (verified) Patient has no active allergies.   History: Past Medical History:  Diagnosis Date   Allergic rhinitis, cause unspecified    Arthritis    hands   Breast cancer (HCC) 2019   8 mm ER/PR positive, HER-2/neu not overexpressed. 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)    Personal history of colonic polyps    Personal history of radiation therapy 2019   mammosite   Pre-diabetes    Presence of dental prosthetic device    implants - top and bottom   Shingles    Swelling of limb    Thyroid goiter    Tobacco use disorder    Unspecified sinusitis (chronic)    Past Surgical History:  Procedure Laterality Date   APPENDECTOMY  06/1997   BREAST BIOPSY Right 11/05/2000    core with clip/neg   BREAST BIOPSY Right 2011   benign, clip placed    BREAST  BIOPSY Right 12/02/2017   [facility]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   CARPAL TUNNEL RELEASE Right 07/25/2020   Procedure: Carpal Tunnel Release - right;  Surgeon: Tia Alert, MD;  Location: [facility] OR;  Service: Neurosurgery;  Laterality: Right;   CATARACT EXTRACTION W/PHACO Left 07/30/2018   Procedure: CATARACT EXTRACTION PHACO AND INTRAOCULAR LENS PLACEMENT (IOC) LEFT;  Surgeon: Lockie Mola, MD;  Location: [facility];  Service: Ophthalmology;  Laterality: Left;   CATARACT EXTRACTION W/PHACO Right 08/27/2018   Procedure: CATARACT EXTRACTION PHACO AND INTRAOCULAR LENS PLACEMENT (IOC)  RIGHT;  Surgeon: Lockie Mola, MD;  Location[facility];  Service: Ophthalmology;  Laterality: Right;   COLONOSCOPY  10/15/2012   mutliple polyps.  Outlaw in [place].  Repeat 5 years.   COLONOSCOPY W/ BIOPSIES  03/30/2004   Adenomatous polyp of the cecum, hyperplastic polyps of the splenic flexure, upper rectum and mid rectum.   COLONOSCOPY WITH PROPOFOL N/A 03/19/2018   Tubular adenoma x2 without atypia.  Follow-up 2024.  Surgeon: Earline Mayotte, MD;  Location: [facility] ENDOSCOPY;  Service: Endoscopy;  Laterality: N/A;   COLPOSCOPY  10/16/2011   HGSIL pap smear.  Negative.   EXTERNAL EAR SURGERY  09/2008   external - not specified  (RIGHT)   EYE SURGERY     MASTECTOMY, PARTIAL Right 12/23/2017   T1b, N0; 8 mm ER: 100%; PR: 100%; Her 2 neu not overexpressed.  MammoSite placement.  Surgeon: Earline Mayotte, MD;  Location: [facility] ORS;  Service: General;  Laterality: Right;   SENTINEL NODE BIOPSY Right 12/23/2017   Procedure: SENTINEL NODE BIOPSY;  Surgeon: Earline Mayotte, MD;  Location: [facility] ORS;  Service: General;  Laterality: Right;   Family History  Problem Relation Age of Onset   Heart failure Father    Hypertension Father    Stroke Father    Hyperlipidemia Father    Heart failure Brother    Diabetes Brother    Heart disease Brother    Diabetes Brother    Heart disease Brother    Hypertension  Sister    Depression Sister    Hypertension Sister    Asthma Sister    Hyperlipidemia Sister    Arthritis Other    Coronary artery disease Other    Hyperlipidemia Brother    Hypertension Brother    Breast cancer Maternal Aunt    Social History   Socioeconomic History   Marital status: Married    Spouse name: Not on file   Number of children: 2   Years of education: Not on file   Highest education level: Not on file  Occupational History   Occupation: homemaker  Tobacco Use   Smoking status: Former    Packs/day: 1.00    Years: 40.00  Pack years: 40.00    Types: Cigarettes    Quit date: 04/02/2003    Years since quitting: 18.7   Smokeless tobacco: Never   Tobacco comments:    7-10 cigarettes for past 40 years   Vaping Use   Vaping Use: Every day  Substance and Sexual Activity   Alcohol use: Yes    Comment: rarely   Drug use: No   Sexual activity: Yes    Birth control/protection: Post-menopausal  Other Topics Concern   Not on file  Social History Narrative   Marital status: married x 47 years; happily married; no abuse.      Children: 2 sons; one granddaughter (10), 1 adopted grandson (1) in [place].      Lives: with husband.      Employment: unemployed/homemaker.      Tobacco: quit smoking 03/2012.  Electronic cigarette.      Alcohol: socially; 2 glasses per year.      Exercise:  None/sporadic      Seatbelt:  Always uses seat belts.   Smoke alarm and carbon monoxide detector in the home.       Guns:  No guns.      Caffeine YQM:VHQION 3 servings per day.      ADLs: independent with ADLs in 2018; drives; no assistant devices.       Advanced Directives: yes; DNR/DNI   Social Determinants of Health   Financial Resource Strain: Low Risk    Difficulty of Paying Living Expenses: Not hard at all  Food Insecurity: No Food Insecurity   Worried About Programme researcher, broadcasting/film/video in the Last Year: Never true   Ran Out of Food in the Last Year: Never true  Transportation Needs:  No Transportation Needs   Lack of Transportation (Medical): No   Lack of Transportation (Non-Medical): No  Physical Activity: Not on file  Stress: No Stress Concern Present   Feeling of Stress : Not at all  Social Connections: Unknown   Frequency of Communication with Friends and Family: Not on file   Frequency of Social Gatherings with Friends and Family: More than three times a week   Attends Religious Services: Not on Scientist, clinical (histocompatibility and immunogenetics) or Organizations: Not on file   Attends Banker Meetings: Not on file   Marital Status: Married    Tobacco Counseling Counseling given: Not Answered Tobacco comments: 7-10 cigarettes for past 40 years    Clinical Intake:  Pre-visit preparation completed: Yes        Diabetes: Yes (Followed by PCP)  How often do you need to have someone help you when you read instructions, pamphlets, or other written materials from your doctor or pharmacy?: 1 - Never  Interpreter Needed?: No    Activities of Daily Living In your present state of health, do you have any difficulty performing the following activities: 12/27/2021  Hearing? N  Vision? N  Difficulty concentrating or making decisions? N  Walking or climbing stairs? N  Dressing or bathing? N  Doing errands, shopping? N  Preparing Food and eating ? N  Using the Toilet? N  In the past six months, have you accidently leaked urine? N  Do you have problems with loss of bowel control? N  Managing your Medications? N  Managing your Finances? N  Housekeeping or managing your Housekeeping? N  Some recent data might be hidden   Patient Care Team: Sherlene Shams, MD as PCP - General (Internal Medicine) Julien Nordmann  J, MD as Consulting Physician (Cardiology) Lemar Livings, Merrily Pew, MD (General Surgery) Sherlene Shams, MD (Internal Medicine)  Indicate any recent Medical Services you may have received from other than [facility] providers in the past year (date may be  approximate).     Assessment:   This is a routine wellness examination for Maylea.  Virtual Visit via Telephone Note  I connected with  Lebron Quam on 12/27/21 at 10:30 AM EDT by telephone and verified that I am speaking with the correct person using two identifiers.  Persons participating in the virtual visit: patient/Nurse Health Advisor   I discussed the limitations of evaluation and management service by telehealth. The patient expressed understanding and agreed to proceed. We continued and completed visit with audio only.  Some vital signs may be absent or patient reported.   Hearing/Vision screen Hearing Screening - Comments:: Patient is able to hear conversational tones without difficulty.  Vision Screening - Comments:: Cataract extraction, bilateral They have seen their ophthalmologist in the last 12 months.   Dietary issues and exercise activities discussed: Current Exercise Habits: Home exercise routine, Intensity: Mild Low carb diet Good water intake   Goals Addressed             This Visit's Progress    Increase physical activity       As tolerated.       Depression Screen PHQ 2/9 Scores 12/27/2021 07/20/2021 04/19/2021 02/20/2021 12/26/2020 11/02/2020 07/06/2020  PHQ - 2 Score 0 1 2 0 0 0 0  PHQ- 9 Score - - 9 9 - - -    Fall Risk Fall Risk  12/27/2021 07/20/2021 04/19/2021 01/09/2021 12/26/2020  Falls in the past year? 0 0 0 0 0  Number falls in past yr: 0 - - 0 0  Injury with Fall? - - - 0 0  Follow up Falls evaluation completed Falls evaluation completed Falls evaluation completed Falls evaluation completed Falls evaluation completed    FALL RISK PREVENTION PERTAINING TO THE HOME:  Home free of loose throw rugs in walkways, pet beds, electrical cords, etc? Yes  Adequate lighting in your home to reduce risk of falls? Yes   ASSISTIVE DEVICES UTILIZED TO PREVENT FALLS: Use of a cane, walker or w/c? No   TIMED UP AND GO: Was the test performed?  No .   Cognitive Function: Patient is alert and oriented x3.  MMSE - Mini Mental State Exam 12/24/2019  Not completed: Unable to complete        Immunizations Immunization History  Administered Date(s) Administered   Influenza Split 08/21/2010, 08/15/2014   Influenza, High Dose Seasonal PF 11/20/2017, 09/05/2018, 07/30/2019   Influenza, Seasonal, Injecte, Preservative Fre 10/06/2012   Influenza,inj,Quad PF,6+ Mos 07/03/2016, 07/20/2021   Influenza-Unspecified 09/29/2020   Moderna Sars-Covid-2 Vaccination 11/24/2019, 12/22/2019, 09/15/2020   Pneumococcal Conjugate-13 09/20/2014   Pneumococcal Polysaccharide-23 10/06/2012   Tdap 09/12/2011   Zoster Recombinat (Shingrix) 08/13/2018, 10/13/2018   Zoster, Live 10/28/2012   TDAP status: Due, Education has been provided regarding the importance of this vaccine. Advised may receive this vaccine at local pharmacy or Health Dept. Aware to provide a copy of the vaccination record if obtained from local pharmacy or Health Dept. Verbalized acceptance and understanding. Deferred.   Screening Tests Health Maintenance  Topic Date Due   COVID-19 Vaccine (4 - Booster for Moderna series) 01/12/2022 (Originally 11/10/2020)   TETANUS/TDAP  12/28/2022 (Originally 09/11/2021)   HEMOGLOBIN A1C  01/18/2022   URINE MICROALBUMIN  04/13/2022   OPHTHALMOLOGY  EXAM  04/27/2022   FOOT EXAM  07/20/2022   MAMMOGRAM  10/03/2022   COLONOSCOPY (Pts 45-56yrs Insurance coverage will need to be confirmed)  03/19/2028   Pneumonia Vaccine 82+ Years old  Completed   INFLUENZA VACCINE  Completed   DEXA SCAN  Completed   Hepatitis C Screening  Completed   Zoster Vaccines- Shingrix  Completed   HPV VACCINES  Aged Out   Health Maintenance There are no preventive care reminders to display for this patient.  Lung Cancer Screening: (Low Dose CT Chest recommended if Age 70-80 years, 30 pack-year currently smoking OR have quit w/in 15years.) does not qualify.   Vision  Screening: Recommended annual ophthalmology exams for early detection of glaucoma and other disorders of the eye.  Dental Screening: Recommended annual dental exams for proper oral hygiene  Community Resource Referral / Chronic Care Management: CRR required this visit?  No   CCM required this visit?  No      Plan:   Keep all routine maintenance appointments.   I have personally reviewed and noted the following in the patient's chart:   Medical and social history Use of alcohol, tobacco or illicit drugs  Current medications and supplements including opioid prescriptions.  Functional ability and status Nutritional status Physical activity Advanced directives List of other physicians Hospitalizations, surgeries, and ER visits in previous 12 months Vitals Screenings to include cognitive, depression, and falls Referrals and appointments  In addition, I have reviewed and discussed with patient certain preventive protocols, quality metrics, and best practice recommendations. A written personalized care plan for preventive services as well as general preventive health recommendations were provided to patient via mychart.     Ashok Pall, LPN   1/61/0960

## 2022-01-18 ENCOUNTER — Ambulatory Visit: Payer: Medicare Other | Admitting: Internal Medicine

## 2022-01-18 ENCOUNTER — Encounter: Payer: Self-pay | Admitting: Internal Medicine

## 2022-01-18 VITALS — BP 130/78 | HR 63 | Temp 98.8°F | Ht 62.0 in | Wt 215.2 lb

## 2022-01-18 DIAGNOSIS — G5601 Carpal tunnel syndrome, right upper limb: Secondary | ICD-10-CM

## 2022-01-18 DIAGNOSIS — E1169 Type 2 diabetes mellitus with other specified complication: Secondary | ICD-10-CM

## 2022-01-18 DIAGNOSIS — M8580 Other specified disorders of bone density and structure, unspecified site: Secondary | ICD-10-CM

## 2022-01-18 DIAGNOSIS — Z780 Asymptomatic menopausal state: Secondary | ICD-10-CM

## 2022-01-18 DIAGNOSIS — M85852 Other specified disorders of bone density and structure, left thigh: Secondary | ICD-10-CM

## 2022-01-18 DIAGNOSIS — Z853 Personal history of malignant neoplasm of breast: Secondary | ICD-10-CM

## 2022-01-18 DIAGNOSIS — I2584 Coronary atherosclerosis due to calcified coronary lesion: Secondary | ICD-10-CM

## 2022-01-18 DIAGNOSIS — I2510 Atherosclerotic heart disease of native coronary artery without angina pectoris: Secondary | ICD-10-CM

## 2022-01-18 DIAGNOSIS — E039 Hypothyroidism, unspecified: Secondary | ICD-10-CM

## 2022-01-18 DIAGNOSIS — E785 Hyperlipidemia, unspecified: Secondary | ICD-10-CM | POA: Diagnosis not present

## 2022-01-18 DIAGNOSIS — I700 Atherosclerosis of aorta: Secondary | ICD-10-CM

## 2022-01-18 DIAGNOSIS — G5600 Carpal tunnel syndrome, unspecified upper limb: Secondary | ICD-10-CM | POA: Insufficient documentation

## 2022-01-18 DIAGNOSIS — M48061 Spinal stenosis, lumbar region without neurogenic claudication: Secondary | ICD-10-CM

## 2022-01-18 LAB — LIPID PANEL
Cholesterol: 128 mg/dL (ref 0–200)
HDL: 51.5 mg/dL (ref >39.00)
LDL Cholesterol: 53 mg/dL (ref 0–99)
NonHDL: 76.22
Total CHOL/HDL Ratio: 2
Triglycerides: 114 mg/dL (ref 0.0–149.0)
VLDL: 22.8 mg/dL (ref 0.0–40.0)

## 2022-01-18 LAB — COMPREHENSIVE METABOLIC PANEL
ALT: 15 U/L (ref 0–35)
AST: 16 U/L (ref 0–37)
Albumin: 4.4 g/dL (ref 3.5–5.2)
Alkaline Phosphatase: 54 U/L (ref 39–117)
BUN: 10 mg/dL (ref 6–23)
CO2: 32 mEq/L (ref 19–32)
Calcium: 9 mg/dL (ref 8.4–10.5)
Chloride: 102 mEq/L (ref 96–112)
Creatinine, Ser: 0.87 mg/dL (ref 0.40–1.20)
GFR: 65.46 mL/min (ref >60.00)
Glucose, Bld: 132 mg/dL — ABNORMAL HIGH (ref 70–99)
Potassium: 3.9 mEq/L (ref 3.5–5.1)
Sodium: 143 mEq/L (ref 135–145)
Total Bilirubin: 0.7 mg/dL (ref 0.2–1.2)
Total Protein: 6.5 g/dL (ref 6.0–8.3)

## 2022-01-18 LAB — MICROALBUMIN / CREATININE URINE RATIO
Creatinine,U: 181.1 mg/dL
Microalb Creat Ratio: 0.6 mg/g (ref 0.0–30.0)
Microalb, Ur: 1 mg/dL (ref 0.0–1.9)

## 2022-01-18 LAB — LDL CHOLESTEROL, DIRECT: Direct LDL: 63 mg/dL

## 2022-01-18 LAB — TSH: TSH: 2.73 u[IU]/mL (ref 0.35–5.50)

## 2022-01-18 LAB — HEMOGLOBIN A1C: Hgb A1c MFr Bld: 6.3 % (ref 4.6–6.5)

## 2022-01-18 NOTE — Assessment & Plan Note (Signed)
She has stopped doing the exercises recommended by Physical therapy post operatively.  Reminded of the need to continue back strengthening exercises and walking ?

## 2022-01-18 NOTE — Patient Instructions (Addendum)
PLEASE START WALKING DAILY AND RESUME YOUR BACK EXERCISES TO AVOID WEAKNESS  ? ? ? ?Your  DEXA scan  was last done at [facility],  not at [facility],  and has  been ordered.  You are encouraged (required) to call to schedule your own  appointment at [facility]  , and their phone number is [phone]  ?

## 2022-01-18 NOTE — Progress Notes (Signed)
? ?Subjective:  ?Patient ID: Carol [name], female    DOB: 1945-10-16  Age: 76 y.o. MRN: [id number] ? ?CC: The primary encounter diagnosis was Hyperlipidemia associated with type 2 diabetes mellitus ([place]). Diagnoses of Hypothyroidism (acquired), Osteopenia after menopause, Osteopenia due to cancer therapy, Osteopenia of neck of left femur, Abdominal aortic atherosclerosis ([place]), Morbid obesity ([place]), History of breast cancer, Coronary atherosclerosis due to calcified coronary lesion, Spinal stenosis at L4-L5 level, and Carpal tunnel syndrome of right wrist were also pertinent to this visit. ? ? ?This visit occurred during the SARS-CoV-2 public health emergency.  Safety protocols were in place, including screening questions prior to the visit, additional usage of staff PPE, and extensive cleaning of exam room while observing appropriate contact time as indicated for disinfecting solutions.   ? ?HPI ?Carol [name] presents for  ?Chief Complaint  ?Patient presents with  ? Follow-up  ?  3 month follow up on diabetes  ? ?1) T2DM:   She  feels generally well,  But is not  exercising regularly or trying to lose weight.  Lives a very sedentary life,  but emphatic about not being depressed . Has no interest in exercising .  Used to walk for at the mall   and no longer does the home PT exercises since her posterior lumbar fusion surgery Oct 2021 .   Does not own a glucometer  to check .   Denies any recent hypoglyemic events.  Taking no antihyperglycemics,  but taking rosuvastatin for primary prevention   Following a carbohydrate modified diet 6 days per week. But eats 2 pecan cookies every day (2) . Denies numbness, burning and tingling of extremities. Appetite is good.   Foot exam done.  1+ itting edema takes lasix bid.  ? ?Lab Results  ?Component Value Date  ? HGBA1C 6.4 07/20/2021  ? ? ? ?2) Aortic atherosclerosis:  we reviewed findings of prior CT scan today and coronary calcium CT done in 2017,   calcium score high at 176.Marland Kitchen  Patient is tolerating high potency statin therapy   ? ?3) osteopenia secondary to chemotherapy:  her  last DEXA was done in 2019.  She has been taking prolia for the past 2 year s ? ?4) Obesity:  she is aware of the benefits of exercise but lacks motivation to resume regular walks.   ? ? ?Outpatient Medications Prior to Visit  ?Medication Sig Dispense Refill  ? Ascorbic Acid (VITAMIN C) 1000 MG tablet Take 1,000 mg by mouth daily.     ? aspirin 81 MG tablet Take 81 mg by mouth daily.     ? atorvastatin (LIPITOR) 40 MG tablet Take 1 tablet (40 mg total) by mouth daily. 90 tablet 1  ? cetirizine (ZYRTEC) 10 MG tablet Take 10 mg by mouth every morning.     ? Cholecalciferol (VITAMIN D3) 2000 UNITS capsule Take 2,000 Units by mouth daily.      ? Coenzyme Q10 (COQ-10) 200 MG CAPS Take 200 mg by mouth daily.    ? furosemide (LASIX) 20 MG tablet TAKE ONE TABLET TWICE DAILY 180 tablet 3  ? Glucosamine Sulfate 1000 MG CAPS Take 1,000 mg by mouth daily.    ? letrozole (FEMARA) 2.5 MG tablet TAKE ONE TABLET EVERY DAY 90 tablet 3  ? levothyroxine (SYNTHROID) 50 MCG tablet TAKE 1 TABLET EVERY DAY ON EMPTY STOMACHWITH A GLASS OF WATER AT LEAST 30-60 MINBEFORE BREAKFAST 90 tablet 0  ? Misc Natural Products (OSTEO BI-FLEX JOINT SHIELD)  TABS Take 1 tablet by mouth daily.     ? Olopatadine HCl (PATADAY OP) Place 1 drop into both eyes daily as needed (allergies).    ? Polyvinyl Alcohol-Povidone (REFRESH OP) Place 1 drop into both eyes daily as needed (dry eyes).    ? rOPINIRole (REQUIP) 0.5 MG tablet TAKE ONE TABLET AT BEDTIME 90 tablet 3  ? senna (SENOKOT) 8.6 MG tablet Take 12 tablets by mouth at bedtime.     ? sertraline (ZOLOFT) 50 MG tablet TAKE 1 TABLET BY MOUTH DAILY 90 tablet 3  ? ?No facility-administered medications prior to visit.  ? ? ?Review of Systems; ? ?Patient denies headache, fevers, malaise, unintentional weight loss, skin rash, eye pain, sinus congestion and sinus pain, sore throat,  dysphagia,  hemoptysis , cough, dyspnea, wheezing, chest pain, palpitations, orthopnea, edema, abdominal pain, nausea, melena, diarrhea, constipation, flank pain, dysuria, hematuria, urinary  Frequency, nocturia, numbness, tingling, seizures,  Focal weakness, Loss of consciousness,  Tremor, insomnia, depression, anxiety, and suicidal ideation.   ? ? ? ?Objective:  ?BP 130/78 (BP Location: Left Arm, Patient Position: Sitting, Cuff Size: Large)   Pulse 63   Temp 98.8 ?F (37.1 ?C) (Oral)   Ht '5\' 2"'$  (1.575 m)   Wt 215 lb 3.2 oz (97.6 kg)   SpO2 94%   BMI 39.36 kg/m?  ? ?BP Readings from Last 3 Encounters:  ?01/18/22 130/78  ?07/20/21 120/72  ?04/19/21 122/74  ? ? ?Wt Readings from Last 3 Encounters:  ?01/18/22 215 lb 3.2 oz (97.6 kg)  ?12/27/21 216 lb (98 kg)  ?07/20/21 216 lb 6.4 oz (98.2 kg)  ? ? ?General appearance: alert, cooperative and appears stated age ?Ears: normal TM's and external ear canals both ears ?Throat: lips, mucosa, and tongue normal; teeth and gums normal ?Neck: no adenopathy, no carotid bruit, supple, symmetrical, trachea midline and thyroid not enlarged, symmetric, no tenderness/mass/nodules ?Back: symmetric, no curvature. ROM normal. No CVA tenderness. ?Lungs: clear to auscultation bilaterally ?Heart: regular rate and rhythm, S1, S2 normal, no murmur, click, rub or gallop ?Abdomen: soft, non-tender; bowel sounds normal; no masses,  no organomegaly ?Pulses: 2+ and symmetric ?Skin: Skin color, texture, turgor normal. No rashes or lesions ?Lymph nodes: Cervical, supraclavicular, and axillary nodes normal. ? ?Lab Results  ?Component Value Date  ? HGBA1C 6.4 07/20/2021  ? HGBA1C 6.3 04/13/2021  ? HGBA1C 6.1 01/09/2021  ? ? ?Lab Results  ?Component Value Date  ? CREATININE 0.86 07/20/2021  ? CREATININE 0.95 04/13/2021  ? CREATININE 0.80 01/09/2021  ? ? ?Lab Results  ?Component Value Date  ? WBC 9.1 01/09/2021  ? HGB 14.6 01/09/2021  ? HCT 43.3 01/09/2021  ? PLT 201.0 01/09/2021  ? GLUCOSE 121 (H)  07/20/2021  ? CHOL 140 07/20/2021  ? TRIG 128.0 07/20/2021  ? HDL 58.40 07/20/2021  ? [place] 56 07/20/2021  ? ALT 11 07/20/2021  ? AST 14 07/20/2021  ? NA 142 07/20/2021  ? K 4.0 07/20/2021  ? CL 101 07/20/2021  ? CREATININE 0.86 07/20/2021  ? BUN 8 07/20/2021  ? CO2 33 (H) 07/20/2021  ? TSH 2.25 07/20/2021  ? HGBA1C 6.4 07/20/2021  ? MICROALBUR 1.6 04/13/2021  ? ? ?MM DIAG BREAST TOMO BILATERAL ? ?Result Date: 10/03/2021 ?CLINICAL DATA:  Patient presents for bilateral diagnostic examination to follow-up a probable benign abnormality over the lumpectomy site in the posterior outer right breast which is palpable and was originally worked up October 2020. 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 EXAM: DIGITAL DIAGNOSTIC BILATERAL MAMMOGRAM WITH TOMOSYNTHESIS AND CAD TECHNIQUE: Bilateral digital diagnostic mammography and breast tomosynthesis was performed. The images were evaluated with computer-aided detection. COMPARISON:  Previous exam(s). ACR Breast Density Category b: There are scattered areas of fibroglandular density. FINDINGS: Examination demonstrates stable post lumpectomy changes over the posterior outer midportion of the right breast with evolving dystrophic calcifications/fat necrosis at the lumpectomy site. Remainder of the right breast as well as the left breast is unchanged. Patient declined follow-up right breast ultrasound and left prior to the above results being discussed with the patient by the radiologist. IMPRESSION: Evolving fat necrosis at the lumpectomy site in the posterior outer midportion of the right breast. No new findings. Ultrasound follow-up could not be performed as patient declined further imaging at this time and therefore 2 years of stability could not be established for the indeterminate sonographic abnormality at the lumpectomy site. RECOMMENDATION: Recommend ultrasound evaluation of the indeterminate palpable area along the  lateral aspect of patient's lumpectomy scar in the 8 o'clock position of the right breast 12 cm from the nipple. If 2 years of stability can be documented, patient can return to yearly screening mammographic follow

## 2022-01-18 NOTE — Assessment & Plan Note (Signed)
Thyroid function has been  WNL on current 50 mcg levothyroxine. dose.  No current changes needed.  ? ?Lab Results  ?Component Value Date  ? TSH 2.25 07/20/2021  ? ? ?

## 2022-01-18 NOTE — Assessment & Plan Note (Addendum)
Her diabetes has been diet controlled, but her morbid obesity remains problematic.  She has no incentive to change or take medications  . She is tolerating atorvastatin.  She has no microalbuinuria ? ? ?Lab Results  ?Component Value Date  ? HGBA1C 6.4 07/20/2021  ? ?Lab Results  ?Component Value Date  ? LABMICR <3.0 03/06/2017  ? MICROALBUR 1.6 04/13/2021  ? MICROALBUR 1.2 07/05/2020  ? ?Lab Results  ?Component Value Date  ? CHOL 140 07/20/2021  ? HDL 58.40 07/20/2021  ? [place] 56 07/20/2021  ? TRIG 128.0 07/20/2021  ? CHOLHDL 2 07/20/2021  ? ? ? ? ?

## 2022-01-18 NOTE — Assessment & Plan Note (Addendum)
Secondary to chemotherapy for BRCA.  Last DEXA 2019  Has been taking prolia since July 2019 . Repeat DEXA is due  ?

## 2022-01-18 NOTE — Assessment & Plan Note (Signed)
Diagnosed in march 2019  Treated with lumpectomy and adjuvant therapy.  Continue follow up with Dr Bary Castilla and oncology ?

## 2022-01-18 NOTE — Assessment & Plan Note (Signed)
Asymptomatic . Encouraged to continue statin and asa and resume daily walking.  She  ?

## 2022-02-14 ENCOUNTER — Other Ambulatory Visit: Payer: Self-pay | Admitting: Internal Medicine

## 2022-02-28 ENCOUNTER — Other Ambulatory Visit: Payer: Self-pay | Admitting: Internal Medicine

## 2022-04-19 ENCOUNTER — Ambulatory Visit: Payer: Medicare Other | Admitting: Internal Medicine

## 2022-04-30 LAB — HM DIABETES EYE EXAM

## 2022-05-08 ENCOUNTER — Telehealth: Payer: Self-pay | Admitting: *Deleted

## 2022-05-10 ENCOUNTER — Other Ambulatory Visit: Payer: Self-pay | Admitting: Internal Medicine

## 2022-05-25 ENCOUNTER — Other Ambulatory Visit: Payer: Self-pay | Admitting: Internal Medicine

## 2022-05-30 ENCOUNTER — Ambulatory Visit: Payer: Medicare Other

## 2022-06-01 NOTE — Telephone Encounter (Signed)
Resubmitted insurance to [employer] portal for benefit verification.

## 2022-06-14 NOTE — Telephone Encounter (Signed)
Amount due for Prolia is $301  Pt no showed for appt on 05/30/22.  Sent mychart to notify patient of amount

## 2022-06-21 ENCOUNTER — Encounter: Payer: Self-pay | Admitting: *Deleted

## 2022-07-19 NOTE — Telephone Encounter (Signed)
Pt no showed for Prolia on 8/16, called & sent mychart on 9/7, & mailed letter on 9/28. No further attempts will be made to reschedule Prolia. Prolia chart will be archived.

## 2022-07-23 NOTE — Telephone Encounter (Signed)
Spoke with pt and she stated that she is still getting the prolia injection and guesses she just got her dates mixed up and missed her last injection. Pt would like to continue with them.

## 2022-07-26 NOTE — Telephone Encounter (Signed)
Left voicemail for pt to return call to reschedule missed Prolia appt.  Please remind pt that she will still be responsible for the $301 on the day of her injection.

## 2022-07-31 ENCOUNTER — Ambulatory Visit: Payer: Medicare Other

## 2022-07-31 DIAGNOSIS — M85852 Other specified disorders of bone density and structure, left thigh: Secondary | ICD-10-CM | POA: Diagnosis not present

## 2022-07-31 MED ORDER — DENOSUMAB 60 MG/ML ~~LOC~~ SOSY
60.0000 mg | PREFILLED_SYRINGE | Freq: Once | SUBCUTANEOUS | Status: AC
  Administered 2022-07-31: 60 mg via SUBCUTANEOUS

## 2022-07-31 NOTE — Progress Notes (Signed)
Pt arrived for Prolia injection, given in L arm SQ. Pt tolerated injection well, showed no signs of distress nor voiced any concerns.   Instructed pt to head to checkout to sch next dose in 6 mon, on or after 01/30/23. Pt verbalized understanding to info.

## 2022-07-31 NOTE — Telephone Encounter (Signed)
Pt received Prolia shot today in office (07/31/22)

## 2022-08-07 NOTE — Telephone Encounter (Signed)
Late entry: Pt had Prolia injection on 07/31/22

## 2022-08-20 ENCOUNTER — Other Ambulatory Visit: Payer: Self-pay | Admitting: General Surgery

## 2022-08-20 DIAGNOSIS — C50411 Malignant neoplasm of upper-outer quadrant of right female breast: Secondary | ICD-10-CM

## 2022-08-23 ENCOUNTER — Ambulatory Visit
Admission: RE | Admit: 2022-08-23 | Discharge: 2022-08-23 | Disposition: A | Discharge status: Home / Self Care | Payer: Medicare Other | Source: Ambulatory Visit | Attending: Internal Medicine | Admitting: Internal Medicine

## 2022-08-23 ENCOUNTER — Other Ambulatory Visit: Payer: Self-pay | Admitting: General Surgery

## 2022-08-23 DIAGNOSIS — Z853 Personal history of malignant neoplasm of breast: Secondary | ICD-10-CM | POA: Diagnosis not present

## 2022-08-23 DIAGNOSIS — Z13820 Encounter for screening for osteoporosis: Secondary | ICD-10-CM | POA: Diagnosis present

## 2022-08-23 DIAGNOSIS — F17200 Nicotine dependence, unspecified, uncomplicated: Secondary | ICD-10-CM | POA: Diagnosis not present

## 2022-08-23 DIAGNOSIS — M85852 Other specified disorders of bone density and structure, left thigh: Secondary | ICD-10-CM | POA: Insufficient documentation

## 2022-08-23 DIAGNOSIS — M8580 Other specified disorders of bone density and structure, unspecified site: Secondary | ICD-10-CM

## 2022-08-23 DIAGNOSIS — Z923 Personal history of irradiation: Secondary | ICD-10-CM | POA: Diagnosis not present

## 2022-08-23 DIAGNOSIS — Z780 Asymptomatic menopausal state: Secondary | ICD-10-CM | POA: Insufficient documentation

## 2022-08-23 DIAGNOSIS — C50411 Malignant neoplasm of upper-outer quadrant of right female breast: Secondary | ICD-10-CM

## 2022-08-29 ENCOUNTER — Telehealth: Payer: Self-pay | Admitting: Internal Medicine

## 2022-08-29 DIAGNOSIS — E1169 Type 2 diabetes mellitus with other specified complication: Secondary | ICD-10-CM

## 2022-08-29 DIAGNOSIS — E785 Hyperlipidemia, unspecified: Secondary | ICD-10-CM

## 2022-08-29 DIAGNOSIS — E039 Hypothyroidism, unspecified: Secondary | ICD-10-CM

## 2022-08-29 DIAGNOSIS — Z79899 Other long term (current) drug therapy: Secondary | ICD-10-CM

## 2022-08-29 NOTE — Telephone Encounter (Signed)
Pt would like lab work done before her yearly appointment

## 2022-08-30 ENCOUNTER — Other Ambulatory Visit: Payer: Self-pay | Admitting: Internal Medicine

## 2022-08-30 DIAGNOSIS — I878 Other specified disorders of veins: Secondary | ICD-10-CM

## 2022-08-30 DIAGNOSIS — G2581 Restless legs syndrome: Secondary | ICD-10-CM

## 2022-08-31 NOTE — Telephone Encounter (Signed)
LMTCB. Need to schedule pt for a fasting lab appt before her next appt with Dr. Derrel Nip. Labs have been ordered.

## 2022-08-31 NOTE — Addendum Note (Signed)
Addended by: Adair Laundry on: 08/31/2022 10:22 AM   Modules accepted: Orders

## 2022-09-03 NOTE — Telephone Encounter (Signed)
Lab appt has been scheduled.  

## 2022-09-25 NOTE — Telephone Encounter (Signed)
See. Dr. Lupita Dawn comment.

## 2022-10-04 ENCOUNTER — Other Ambulatory Visit (INDEPENDENT_AMBULATORY_CARE_PROVIDER_SITE_OTHER): Payer: Medicare Other

## 2022-10-04 DIAGNOSIS — E785 Hyperlipidemia, unspecified: Secondary | ICD-10-CM

## 2022-10-04 DIAGNOSIS — Z79899 Other long term (current) drug therapy: Secondary | ICD-10-CM | POA: Diagnosis not present

## 2022-10-04 DIAGNOSIS — E039 Hypothyroidism, unspecified: Secondary | ICD-10-CM

## 2022-10-04 DIAGNOSIS — E1169 Type 2 diabetes mellitus with other specified complication: Secondary | ICD-10-CM

## 2022-10-04 LAB — CBC WITH DIFFERENTIAL/PLATELET
Basophils Absolute: 0 10*3/uL (ref 0.0–0.1)
Basophils Relative: 0.7 % (ref 0.0–3.0)
Eosinophils Absolute: 0.1 10*3/uL (ref 0.0–0.7)
Eosinophils Relative: 2.6 % (ref 0.0–5.0)
HCT: 41.3 % (ref 36.0–46.0)
Hemoglobin: 14.2 g/dL (ref 12.0–15.0)
Lymphocytes Relative: 24.2 % (ref 12.0–46.0)
Lymphs Abs: 1.3 10*3/uL (ref 0.7–4.0)
MCHC: 34.5 g/dL (ref 30.0–36.0)
MCV: 87.1 fl (ref 78.0–100.0)
Monocytes Absolute: 0.5 10*3/uL (ref 0.1–1.0)
Monocytes Relative: 8.2 % (ref 3.0–12.0)
Neutro Abs: 3.6 10*3/uL (ref 1.4–7.7)
Neutrophils Relative %: 64.3 % (ref 43.0–77.0)
Platelets: 195 10*3/uL (ref 150.0–400.0)
RBC: 4.74 Mil/uL (ref 3.87–5.11)
RDW: 14.3 % (ref 11.5–15.5)
WBC: 5.5 10*3/uL (ref 4.0–10.5)

## 2022-10-04 LAB — LIPID PANEL
Cholesterol: 133 mg/dL (ref 0–200)
HDL: 53.6 mg/dL (ref >39.00)
LDL Cholesterol: 60 mg/dL (ref 0–99)
NonHDL: 79.28
Total CHOL/HDL Ratio: 2
Triglycerides: 97 mg/dL (ref 0.0–149.0)
VLDL: 19.4 mg/dL (ref 0.0–40.0)

## 2022-10-04 LAB — COMPREHENSIVE METABOLIC PANEL
ALT: 14 U/L (ref 0–35)
AST: 13 U/L (ref 0–37)
Albumin: 4.1 g/dL (ref 3.5–5.2)
Alkaline Phosphatase: 66 U/L (ref 39–117)
BUN: 8 mg/dL (ref 6–23)
CO2: 31 mEq/L (ref 19–32)
Calcium: 8.7 mg/dL (ref 8.4–10.5)
Chloride: 103 mEq/L (ref 96–112)
Creatinine, Ser: 0.85 mg/dL (ref 0.40–1.20)
GFR: 66.98 mL/min (ref >60.00)
Glucose, Bld: 133 mg/dL — ABNORMAL HIGH (ref 70–99)
Potassium: 3.9 mEq/L (ref 3.5–5.1)
Sodium: 143 mEq/L (ref 135–145)
Total Bilirubin: 0.6 mg/dL (ref 0.2–1.2)
Total Protein: 6.4 g/dL (ref 6.0–8.3)

## 2022-10-04 LAB — HEMOGLOBIN A1C: Hgb A1c MFr Bld: 6.3 % (ref 4.6–6.5)

## 2022-10-04 LAB — TSH: TSH: 2.66 u[IU]/mL (ref 0.35–5.50)

## 2022-10-04 LAB — LDL CHOLESTEROL, DIRECT: Direct LDL: 58 mg/dL

## 2022-10-05 ENCOUNTER — Ambulatory Visit: Payer: Medicare Other | Admitting: Internal Medicine

## 2022-10-10 ENCOUNTER — Ambulatory Visit (INDEPENDENT_AMBULATORY_CARE_PROVIDER_SITE_OTHER): Payer: Medicare Other | Admitting: Internal Medicine

## 2022-10-10 ENCOUNTER — Encounter: Payer: Self-pay | Admitting: Internal Medicine

## 2022-10-10 VITALS — BP 136/78 | HR 95 | Temp 98.3°F | Ht 62.0 in | Wt 218.8 lb

## 2022-10-10 DIAGNOSIS — E1169 Type 2 diabetes mellitus with other specified complication: Secondary | ICD-10-CM

## 2022-10-10 DIAGNOSIS — N6315 Unspecified lump in the right breast, overlapping quadrants: Secondary | ICD-10-CM

## 2022-10-10 DIAGNOSIS — I499 Cardiac arrhythmia, unspecified: Secondary | ICD-10-CM

## 2022-10-10 DIAGNOSIS — E039 Hypothyroidism, unspecified: Secondary | ICD-10-CM

## 2022-10-10 DIAGNOSIS — I2584 Coronary atherosclerosis due to calcified coronary lesion: Secondary | ICD-10-CM

## 2022-10-10 DIAGNOSIS — E785 Hyperlipidemia, unspecified: Secondary | ICD-10-CM

## 2022-10-10 DIAGNOSIS — W57XXXS Bitten or stung by nonvenomous insect and other nonvenomous arthropods, sequela: Secondary | ICD-10-CM

## 2022-10-10 DIAGNOSIS — I2510 Atherosclerotic heart disease of native coronary artery without angina pectoris: Secondary | ICD-10-CM

## 2022-10-10 DIAGNOSIS — Z0001 Encounter for general adult medical examination with abnormal findings: Secondary | ICD-10-CM

## 2022-10-10 DIAGNOSIS — I700 Atherosclerosis of aorta: Secondary | ICD-10-CM

## 2022-10-10 DIAGNOSIS — Z853 Personal history of malignant neoplasm of breast: Secondary | ICD-10-CM | POA: Diagnosis not present

## 2022-10-10 NOTE — Progress Notes (Unsigned)
Patient ID: Carol [name], female    DOB: 10/29/1945  Age: 76 y.o. MRN: [id number]  The patient is here for annual PREVENTIVE  examination and management of other chronic and acute problems.   The risk factors are reflected in the social history.  The roster of all physicians providing medical care to patient - is listed in the Snapshot section of the chart.  Activities of daily living:  The patient is 100% independent in all ADLs: dressing, toileting, feeding as well as independent mobility  Home safety : The patient has smoke detectors in the home. They wear seatbelts.  There are no firearms at home. There is no violence in the home.   There is no risks for hepatitis, STDs or HIV. There is no   history of blood transfusion. They have no travel history to infectious disease endemic areas of the world.  The patient has seen their dentist in the last six month. They have seen their eye doctor in the last year. They admit to slight hearing difficulty with regard to whispered voices and some television programs.  They have deferred audiologic testing in the last year.  They do not  have excessive sun exposure. Discussed the need for sun protection: hats, long sleeves and use of sunscreen if there is significant sun exposure.   Diet: the importance of a healthy diet is discussed. They do have a healthy diet.  The benefits of regular aerobic exercise were discussed. She walks 4 times per week ,  20 minutes.   Depression screen: there are no [name] or vegative symptoms of depression- irritability, change in appetite, anhedonia, sadness/tearfullness.  Cognitive assessment: the patient manages all their financial and personal affairs and is actively engaged. They could relate day,date,year and events; recalled 2/3 objects at 3 minutes; performed clock-face test normally.  The following portions of the patient's history were reviewed and updated as appropriate: allergies, current medications,  past family history, past medical history,  past surgical history, past social history  and problem list.  Visual acuity was not assessed per patient preference since she has regular follow up with her ophthalmologist. Hearing and body mass index were assessed and reviewed.   During the course of the visit the patient was educated and counseled about appropriate screening and preventive services including : fall prevention , diabetes screening, nutrition counseling, colorectal cancer screening, and recommended immunizations.    CC: There were no encounter diagnoses.   H/O BRCA:  s/p partial mastectomy/lumpectomy  on the right breast March 2019, with accelerated XRT.    T1b carcinoma of the right breast . Options for mgmt per Dr B yrnett's OV noted rom Dec 2022 included continued observation vs , diagnostic breast biopsy. Biopsy was preferred and ordered last year but deferred due to patient;s COVID illness,  and patient never rescheduled,  so an ultrasound of both breasts and diagnostic bilateral mammogram  (all  ordered by Dr Bary Castilla in November 2023) however she  was never contacted by  Carol [name] AND NEEDS EVERYTHING REORDERED.    2) Type 2 DM with hyperlipidemia, obesity,  last seen in April ; 3)osteoporosis getting prolia since 2021.  Continues to gain weight,  denies large appetite but feels entitled to eat whatever she wants.  She does not exercis e    4)    History Carol [name] has a past medical history of Allergic rhinitis, cause unspecified, Arthritis, Breast cancer ([place]) (2019), Bronchitis, Cataract, Fibromyalgia, HLD (hyperlipidemia), Hypothyroidism, Menieres disease (2011), Other abnormal glucose,  Papanicolaou smear of cervix with high grade squamous intraepithelial lesion (HGSIL), Personal history of colonic polyps, Personal history of radiation therapy (2019), Pre-diabetes, Presence of dental prosthetic device, Shingles, Swelling of limb, Thyroid goiter, Tobacco use disorder, and  Unspecified sinusitis (chronic).   She has a past surgical history that includes Appendectomy (06/1997); External ear surgery (09/2008); Colposcopy (10/16/2011); Colonoscopy (10/15/2012); Colonoscopy w/ biopsies (03/30/2004); Sentinel node biopsy (Right, 12/23/2017); Colonoscopy with propofol (N/A, 03/19/2018); Cataract extraction w/PHACO (Left, 07/30/2018); Cataract extraction w/PHACO (Right, 08/27/2018); Eye surgery; Carpal tunnel release (Right, 07/25/2020); Breast biopsy (Right, 11/05/2000); Breast biopsy (Right, 2011); Breast biopsy (Right, 12/02/2017); Mastectomy, partial (Right, 12/23/2017); Maximum access (mas)posterior lumbar interbody fusion (plif) 1 level (07/05/2020); and Carpal tunnel release (Right, 07/05/2020).   Her family history includes Arthritis in an other family member; Asthma in her sister; Breast cancer in her maternal aunt; Coronary artery disease in an other family member; Depression in her sister; Diabetes in her brother and brother; Heart disease in her brother and brother; Heart failure in her brother and father; Hyperlipidemia in her brother, father, and sister; Hypertension in her brother, father, sister, and sister; Stroke in her father.She reports that she quit smoking about 19 years ago. Her smoking use included cigarettes. She has a 40.00 pack-year smoking history. She has never used smokeless tobacco. She reports current alcohol use. She reports that she does not use drugs.  Outpatient Medications Prior to Visit  Medication Sig Dispense Refill   Ascorbic Acid (VITAMIN C) 1000 MG tablet Take 1,000 mg by mouth daily.      aspirin 81 MG tablet Take 81 mg by mouth daily.      atorvastatin (LIPITOR) 40 MG tablet TAKE 1 TABLET BY MOUTH DAILY 90 tablet 1   cetirizine (ZYRTEC) 10 MG tablet Take 10 mg by mouth every morning.      Cholecalciferol (VITAMIN D3) 2000 UNITS capsule Take 2,000 Units by mouth daily.       Coenzyme Q10 (COQ-10) 200 MG CAPS Take 200 mg by mouth daily.      furosemide (LASIX) 20 MG tablet TAKE ONE TABLET TWICE DAILY 180 tablet 3   Glucosamine Sulfate 1000 MG CAPS Take 1,000 mg by mouth daily.     letrozole (FEMARA) 2.5 MG tablet TAKE ONE TABLET EVERY DAY 90 tablet 3   levothyroxine (SYNTHROID) 50 MCG tablet TAKE 1 TABLET EVERY DAY ON EMPTY STOMACHWITH A GLASS OF WATER AT LEAST 30-60 MINBEFORE BREAKFAST 90 tablet 0   Misc Natural Products (OSTEO BI-FLEX JOINT SHIELD) TABS Take 1 tablet by mouth daily.      Olopatadine HCl (PATADAY OP) Place 1 drop into both eyes daily as needed (allergies).     Polyvinyl Alcohol-Povidone (REFRESH OP) Place 1 drop into both eyes daily as needed (dry eyes).     rOPINIRole (REQUIP) 0.5 MG tablet TAKE ONE TABLET AT BEDTIME 90 tablet 3   senna (SENOKOT) 8.6 MG tablet Take 12 tablets by mouth at bedtime.      sertraline (ZOLOFT) 50 MG tablet TAKE 1 TABLET BY MOUTH DAILY 90 tablet 3   No facility-administered medications prior to visit.    Review of Systems  Patient denies headache, fevers, malaise, unintentional weight loss, skin rash, eye pain, sinus congestion and sinus pain, sore throat, dysphagia,  hemoptysis , cough, dyspnea, wheezing, chest pain, palpitations, orthopnea, edema, abdominal pain, nausea, melena, diarrhea, constipation, flank pain, dysuria, hematuria, urinary  Frequency, nocturia, numbness, tingling, seizures,  Focal weakness, Loss of consciousness,  Tremor, insomnia, depression, anxiety,  and suicidal ideation.     Objective:  BP 136/78   Pulse 95   Temp 98.3 F (36.8 C) (Oral)   Ht _0  (1.575 m)   Wt 218 lb 12.8 oz (99.2 kg)   SpO2 94%   BMI 40.02 kg/m   Physical Exam  Assessment & Plan:  There are no diagnoses linked to this encounter.    I provided 40 minutes of  face-to-face time during this encounter reviewing patient's current problems and past surgeries,  recent labs and imaging studies, providing counseling on the above mentioned problems , and coordination  of care .    Follow-up: No follow-ups on file.   Crecencio Mc, MD

## 2022-10-10 NOTE — Patient Instructions (Addendum)
I have ordered the mammogram ultrasounds of both breasts,  and the right breast biopsy  that Dr Bary Castilla previously ordered.  If [facility] does not call you in one week please let me know   Your diabetes is under excellent control.  All of your labs are fine.  But you are "morbidly obese" and this is your most important health issue currently, so please address it.   PLEASE START AN EXERCISE ROUTINE ASAP!

## 2022-10-11 ENCOUNTER — Encounter: Payer: Self-pay | Admitting: Internal Medicine

## 2022-10-11 NOTE — Assessment & Plan Note (Addendum)
Diagnosed in march 2019  Treated with lumpectomy and XRt No recurrence thus far .  hOwever ,  she has had a lateral mass for the the last 1.5  years that has not been  biopsied yet due to illness.  Her breast surgeon has retired.  I have ordered diagnostic mammogram and biopsy to be done by radiology

## 2022-10-11 NOTE — Assessment & Plan Note (Signed)

## 2022-10-11 NOTE — Assessment & Plan Note (Addendum)
Her diabetes has been diet controlled, but her morbid obesity remains problematic.  She has no incentive to change or take medications  . She is tolerating every other day atorvastatin.  She has no microalbuminuria Lab Results  Component Value Date   HGBA1C 6.3 10/04/2022   Lab Results  Component Value Date   LABMICR <3.0 03/06/2017   MICROALBUR 1.0 01/18/2022   MICROALBUR 1.6 04/13/2021   Lab Results  Component Value Date   CHOL 133 10/04/2022   HDL 53.60 10/04/2022   LDLCALC 60 10/04/2022   LDLDIRECT 58.0 10/04/2022   TRIG 97.0 10/04/2022   CHOLHDL 2 10/04/2022

## 2022-10-11 NOTE — Assessment & Plan Note (Addendum)
CT reviewed with patient . She is tolerating atorvastatin every other day Lab Results  Component Value Date   CHOL 133 10/04/2022   HDL 53.60 10/04/2022   LDLCALC 60 10/04/2022   LDLDIRECT 58.0 10/04/2022   TRIG 97.0 10/04/2022   CHOLHDL 2 10/04/2022

## 2022-10-11 NOTE — Assessment & Plan Note (Signed)
Asymptomatic . Encouraged to continue statin and asa and resume daily walking.

## 2022-10-11 NOTE — Assessment & Plan Note (Signed)
Resolved

## 2022-10-11 NOTE — Assessment & Plan Note (Signed)
Thyroid function has been  WNL on current 50 mcg levothyroxine. dose.  No current changes needed.   Lab Results  Component Value Date   TSH 2.66 10/04/2022

## 2022-11-07 ENCOUNTER — Other Ambulatory Visit: Payer: Self-pay | Admitting: Internal Medicine

## 2022-12-18 ENCOUNTER — Other Ambulatory Visit: Payer: Self-pay

## 2022-12-18 MED ORDER — LEVOTHYROXINE SODIUM 50 MCG PO TABS: ORAL_TABLET | ORAL | 1 refills | Status: DC

## 2022-12-27 ENCOUNTER — Telehealth: Payer: Self-pay | Admitting: Internal Medicine

## 2022-12-27 NOTE — Telephone Encounter (Signed)
Called patient to schedule Medicare Annual Wellness Visit (AWV). Left message for patient to call back and schedule Medicare Annual Wellness Visit (AWV).  Last date of AWV: 12/27/2021   Please schedule an AWVS appointment at any time with Annual Wellness Visit.  If any questions, please contact me at [phone].    Thank you,  [place] Direct dial  [phone]

## 2023-01-10 ENCOUNTER — Encounter: Payer: Self-pay | Admitting: *Deleted

## 2023-02-11 ENCOUNTER — Ambulatory Visit: Payer: Medicare HMO

## 2023-02-14 ENCOUNTER — Telehealth: Payer: Self-pay | Admitting: *Deleted

## 2023-02-14 NOTE — Telephone Encounter (Signed)
$  301 due; PA required (sent to PA team)   

## 2023-02-25 ENCOUNTER — Other Ambulatory Visit (HOSPITAL_COMMUNITY): Payer: Self-pay

## 2023-02-25 NOTE — Telephone Encounter (Signed)
PA submitted via CMM. Key: [id number]. Status pending.

## 2023-02-25 NOTE — Telephone Encounter (Signed)
Patient Advocate Encounter  Prior Authorization for Prolia 60MG /ML syringes has been approved with [employer].    PA# [id number] Effective dates: 02/25/23 through 10/15/23  Per [facility] test claim, copay for 180 days supply is $95

## 2023-02-26 NOTE — Telephone Encounter (Signed)
Left voicemail to schedule Prolia Inj (overdue)  $95 copay due at time of injection; PA-approved

## 2023-02-28 ENCOUNTER — Encounter: Payer: Self-pay | Admitting: Nurse Practitioner

## 2023-02-28 ENCOUNTER — Ambulatory Visit (INDEPENDENT_AMBULATORY_CARE_PROVIDER_SITE_OTHER): Payer: Medicare HMO | Admitting: Nurse Practitioner

## 2023-02-28 VITALS — BP 126/74 | HR 93 | Temp 98.3°F | Ht 62.0 in | Wt 223.6 lb

## 2023-02-28 DIAGNOSIS — R300 Dysuria: Secondary | ICD-10-CM | POA: Insufficient documentation

## 2023-02-28 DIAGNOSIS — Z780 Asymptomatic menopausal state: Secondary | ICD-10-CM

## 2023-02-28 DIAGNOSIS — M8580 Other specified disorders of bone density and structure, unspecified site: Secondary | ICD-10-CM | POA: Diagnosis not present

## 2023-02-28 LAB — POC URINALSYSI DIPSTICK (AUTOMATED)
Glucose, UA: POSITIVE — AB
Nitrite, UA: POSITIVE
Protein, UA: POSITIVE — AB
Spec Grav, UA: 1.02 (ref 1.010–1.025)
Urobilinogen, UA: 2 E.U./dL — AB
pH, UA: 6 (ref 5.0–8.0)

## 2023-02-28 MED ORDER — DENOSUMAB 60 MG/ML ~~LOC~~ SOSY
60.0000 mg | PREFILLED_SYRINGE | Freq: Once | SUBCUTANEOUS | Status: AC
Start: 2023-02-28 — End: 2023-02-28
  Administered 2023-02-28: 60 mg via SUBCUTANEOUS

## 2023-02-28 MED ORDER — CEPHALEXIN 500 MG PO CAPS: 500.0000 mg | ORAL_CAPSULE | Freq: Two times a day (BID) | ORAL | 0 refills | Status: AC

## 2023-02-28 MED ORDER — LEVOTHYROXINE SODIUM 50 MCG PO TABS: ORAL_TABLET | ORAL | 1 refills | Status: DC

## 2023-02-28 MED ORDER — SERTRALINE HCL 50 MG PO TABS: 50.0000 mg | ORAL_TABLET | Freq: Every day | ORAL | 3 refills | Status: DC

## 2023-02-28 NOTE — Telephone Encounter (Signed)
PT was seen today

## 2023-02-28 NOTE — Progress Notes (Signed)
Established Patient Office Visit  Subjective:  Patient ID: Carol [name], female    DOB: 06/10/1946  Age: 77 y.o. MRN: [id number]  CC:  Chief Complaint  Patient presents with   Urinary Tract Infection    HPI  Carol [name] presents for:  Dysuria  This is a new problem. The current episode started 1 to 4 weeks ago. The problem occurs every urination. The problem has been gradually worsening. The quality of the pain is described as burning. There has been no fever. Associated symptoms include frequency and urgency. Pertinent negatives include no discharge or flank pain. Treatments tried: AZO. The treatment provided mild relief. There is no history of kidney stones or recurrent UTIs.     Past Medical History:  Diagnosis Date   Allergic rhinitis, cause unspecified    Arthritis    hands   Bedbug bite 07/22/2021   Breast cancer (HCC) 2019   8 mm ER/PR positive, HER-2/neu not overexpressed. 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)    Personal history of colonic polyps    Personal history of radiation therapy 2019   mammosite   Pre-diabetes    Presence of dental prosthetic device    implants - top and bottom   Shingles    Swelling of limb    Thyroid goiter    Tobacco use disorder    Unspecified sinusitis (chronic)     Past Surgical History:  Procedure Laterality Date   APPENDECTOMY  06/1997   BREAST BIOPSY Right 11/05/2000    core with clip/neg   BREAST BIOPSY Right 2011   benign, clip placed    BREAST BIOPSY Right 12/02/2017   [facility] and [facility]   CARPAL TUNNEL RELEASE Right 07/25/2020    Procedure: Carpal Tunnel Release - right;  Surgeon: Tia Alert, MD;  Location: [facility] OR;  Service: Neurosurgery;  Laterality: Right;   CARPAL TUNNEL RELEASE Right 07/05/2020   CATARACT EXTRACTION W/PHACO Left 07/30/2018   Procedure: CATARACT EXTRACTION PHACO AND INTRAOCULAR LENS PLACEMENT (IOC) LEFT;  Surgeon: Lockie Mola, MD;  Location: [facility];  Service: Ophthalmology;  Laterality: Left;   CATARACT EXTRACTION W/PHACO Right 08/27/2018   Procedure: CATARACT EXTRACTION PHACO AND INTRAOCULAR LENS PLACEMENT (IOC)  RIGHT;  Surgeon: Lockie Mola, MD;  Location: [facility];  Service: Ophthalmology;  Laterality: Right;   COLONOSCOPY  10/15/2012   mutliple polyps.  Outlaw in [place].  Repeat 5 years.   COLONOSCOPY W/ BIOPSIES  03/30/2004   Adenomatous polyp of the cecum, hyperplastic polyps of the splenic flexure, upper rectum and mid rectum.   COLONOSCOPY WITH PROPOFOL N/A 03/19/2018   Tubular adenoma x2 without atypia.  Follow-up 2024.  Surgeon: Earline Mayotte, MD;  Location: [facility] ENDOSCOPY;  Service: Endoscopy;  Laterality: N/A;   COLPOSCOPY  10/16/2011   HGSIL pap smear.  Negative.   EXTERNAL EAR SURGERY  09/2008   external - not specified  (RIGHT)   EYE SURGERY     MASTECTOMY, PARTIAL Right 12/23/2017   T1b, N0; 8 mm ER: 100%; PR: 100%; Her 2 neu not overexpressed.  MammoSite placement.  Surgeon: Earline Mayotte, MD;  Location: [facility] ORS;  Service: General;  Laterality: Right;   MAXIMUM ACCESS (MAS)POSTERIOR LUMBAR INTERBODY FUSION (PLIF) 1 LEVEL  07/05/2020   L4-5 by Marikay Alar   SENTINEL NODE BIOPSY Right 12/23/2017   Procedure: SENTINEL NODE BIOPSY;  Surgeon: Earline Mayotte, MD;  Location: [facility] ORS;  Service: General;  Laterality: Right;    Family History  Problem Relation Age of Onset   Heart failure Father    Hypertension Father    Stroke Father    Hyperlipidemia Father    Heart failure Brother    Diabetes Brother    Heart  disease Brother    Diabetes Brother    Heart disease Brother    Hypertension Sister    Depression Sister    Hypertension Sister    Asthma Sister    Hyperlipidemia Sister    Arthritis Other    Coronary artery disease Other    Hyperlipidemia Brother    Hypertension Brother    Breast cancer Maternal Aunt     Social History   Socioeconomic History   Marital status: Married    Spouse name: Not on file   Number of children: 2   Years of education: Not on file   Highest education level: Not on file  Occupational History   Occupation: homemaker  Tobacco Use   Smoking status: Former    Packs/day: 1.00    Years: 40.00    Additional pack years: 0.00    Total pack years: 40.00    Types: Cigarettes    Quit date: 04/02/2003    Years since quitting: 19.9   Smokeless tobacco: Never   Tobacco comments:    7-10 cigarettes for past 40 years   Vaping Use   Vaping Use: Every day  Substance and Sexual Activity   Alcohol use: Yes    Comment: rarely   Drug use: No   Sexual activity: Yes    Birth control/protection: Post-menopausal  Other Topics Concern   Not on file  Social History Narrative   Marital status: married x 47 years; happily married; no abuse.      Children: 2 sons; one granddaughter (10), 1 adopted grandson (1) in [place].      Lives: with husband.      Employment: unemployed/homemaker.      Tobacco: quit smoking 03/2012.  Electronic cigarette.      Alcohol: socially; 2 glasses per year.      Exercise:  None/sporadic      Seatbelt:  Always uses seat belts.   Smoke alarm and carbon monoxide detector in the home.       Guns:  No guns.      Caffeine WUJ:WJXBJY 3 servings per day.      ADLs: independent with ADLs in 2018; drives; no assistant devices.       Advanced Directives: yes; DNR/DNI   Social Determinants of Health   Financial Resource Strain: Low Risk  (12/27/2021)   Overall Financial Resource Strain (CARDIA)    Difficulty of Paying Living Expenses: Not hard at  all  Food Insecurity: No Food Insecurity (12/27/2021)   Hunger Vital Sign    Worried About Running Out of Food in the Last Year: Never true    Ran Out of Food in the Last Year: Never true  Transportation Needs: No Transportation Needs (12/27/2021)   PRAPARE - Administrator, Civil Service (Medical): No    Lack of Transportation (Non-Medical): No  Physical Activity: Not on file  Stress: No Stress Concern Present (12/27/2021)   [employer] of Occupational Health - Occupational Stress Questionnaire    Feeling of Stress : Not at all  Social Connections: Unknown (12/27/2021)   Social Connection and Isolation Panel [NHANES]    Frequency of Communication with Friends and Family: Not on file    Frequency of Social Gatherings with Friends and Family: More than three times a week    Attends Religious Services: Not on file    Active Member of Clubs or Organizations: Not on file    Attends Banker Meetings: Not on file    Marital Status: Married  Intimate Partner Violence: Not At Risk (12/27/2021)   Humiliation, Afraid, Rape, and Kick questionnaire    Fear of Current or Ex-Partner: No    Emotionally Abused: No    Physically Abused: No    Sexually Abused: No     Outpatient Medications Prior to Visit  Medication Sig Dispense Refill   Ascorbic Acid (VITAMIN C) 1000 MG tablet Take 1,000 mg by mouth daily.      aspirin 81 MG tablet Take 81 mg by mouth daily.      atorvastatin (LIPITOR) 40 MG tablet TAKE 1 TABLET BY MOUTH DAILY 90 tablet 1   cetirizine (ZYRTEC) 10 MG tablet Take 10 mg by mouth every morning.      Cholecalciferol (VITAMIN D3) 2000 UNITS capsule Take 2,000 Units by mouth daily.       Coenzyme Q10 (COQ-10) 200 MG CAPS Take 200 mg by mouth daily.     furosemide (LASIX) 20 MG tablet TAKE ONE TABLET TWICE DAILY 180 tablet 3   Glucosamine Sulfate 1000 MG CAPS Take 1,000 mg by mouth daily.     letrozole (FEMARA) 2.5 MG tablet TAKE ONE TABLET EVERY DAY 90 tablet  3   Misc Natural Products (OSTEO BI-FLEX JOINT SHIELD) TABS Take 1 tablet by mouth daily.      Olopatadine HCl (PATADAY OP) Place 1 drop into both eyes daily as needed (allergies).     Polyvinyl Alcohol-Povidone (REFRESH OP) Place 1 drop into both eyes daily as needed (dry eyes).     rOPINIRole (REQUIP) 0.5 MG tablet TAKE ONE TABLET AT BEDTIME 90 tablet 3   senna (SENOKOT) 8.6 MG tablet Take 12 tablets by mouth at bedtime.      levothyroxine (SYNTHROID) 50 MCG tablet TAKE 1 TABLET EVERY DAY ON EMPTY STOMACHWITH A GLASS OF WATER AT LEAST 30-60 MINBEFORE BREAKFAST 90 tablet 1   sertraline (ZOLOFT) 50 MG tablet TAKE 1 TABLET BY MOUTH DAILY 90 tablet 3   No facility-administered medications prior to visit.    No Active Allergies  ROS Review of Systems  Genitourinary:  Positive for dysuria, frequency and urgency. Negative for flank pain.  All other systems reviewed and are negative.     Objective:    Physical Exam Constitutional:      Appearance: Normal appearance.  HENT:     Head: Normocephalic.  Eyes:     Pupils: Pupils are equal, round, and reactive to light.  Cardiovascular:     Rate and Rhythm: Normal rate and regular rhythm.     Pulses: Normal pulses.     Heart sounds: Normal heart sounds. No murmur heard. Pulmonary:     Effort: Pulmonary effort is normal.     Breath sounds: Normal breath sounds. No stridor. No wheezing.  Abdominal:     General: Bowel sounds are normal.  Palpations: Abdomen is soft. There is no mass.     Tenderness: There is no abdominal tenderness. There is no right CVA tenderness, left CVA tenderness or guarding.     Hernia: No hernia is present.  Neurological:     General: No focal deficit present.     Mental Status: She is alert and oriented to person, place, and time. Mental status is at baseline.  Psychiatric:        Mood and Affect: Mood normal.        Behavior: Behavior normal.        Thought Content: Thought content normal.         Judgment: Judgment normal.     BP 126/74   Pulse 93   Temp 98.3 F (36.8 C) (Oral)   Ht 5\' 2"  (1.575 m)   Wt 223 lb 9.6 oz (101.4 kg)   SpO2 96%   BMI 40.90 kg/m  Wt Readings from Last 3 Encounters:  02/28/23 223 lb 9.6 oz (101.4 kg)  10/10/22 218 lb 12.8 oz (99.2 kg)  01/18/22 215 lb 3.2 oz (97.6 kg)     Health Maintenance  Topic Date Due   DTaP/Tdap/Td (2 - Td or Tdap) 09/11/2021   COVID-19 Vaccine (4 - 2023-24 season) 06/15/2022   FOOT EXAM  07/20/2022   MAMMOGRAM  10/03/2022   Medicare Annual Wellness (AWV)  12/28/2022   Diabetic kidney evaluation - Urine ACR  01/19/2023   HEMOGLOBIN A1C  04/05/2023   OPHTHALMOLOGY EXAM  05/01/2023   INFLUENZA VACCINE  05/16/2023   Diabetic kidney evaluation - eGFR measurement  10/05/2023   COLONOSCOPY (Pts 45-9yrs Insurance coverage will need to be confirmed)  03/19/2028   Pneumonia Vaccine 83+ Years old  Completed   DEXA SCAN  Completed   Hepatitis C Screening  Completed   Zoster Vaccines- Shingrix  Completed   HPV VACCINES  Aged Out    There are no preventive care reminders to display for this patient.  Lab Results  Component Value Date   TSH 2.66 10/04/2022   Lab Results  Component Value Date   WBC 5.5 10/04/2022   HGB 14.2 10/04/2022   HCT 41.3 10/04/2022   MCV 87.1 10/04/2022   PLT 195.0 10/04/2022   Lab Results  Component Value Date   NA 143 10/04/2022   K 3.9 10/04/2022   CO2 31 10/04/2022   GLUCOSE 133 (H) 10/04/2022   BUN 8 10/04/2022   CREATININE 0.85 10/04/2022   BILITOT 0.6 10/04/2022   ALKPHOS 66 10/04/2022   AST 13 10/04/2022   ALT 14 10/04/2022   PROT 6.4 10/04/2022   ALBUMIN 4.1 10/04/2022   CALCIUM 8.7 10/04/2022   ANIONGAP 10 05/15/2020   GFR 66.98 10/04/2022   Lab Results  Component Value Date   CHOL 133 10/04/2022   Lab Results  Component Value Date   HDL 53.60 10/04/2022   Lab Results  Component Value Date   LDLCALC 60 10/04/2022   Lab Results  Component Value Date   TRIG  97.0 10/04/2022   Lab Results  Component Value Date   CHOLHDL 2 10/04/2022   Lab Results  Component Value Date   HGBA1C 6.3 10/04/2022      Assessment & Plan:  Dysuria Assessment & Plan: Urine positive for leukocytes, nitrites, Protein and glucose Started on cephalexin 500 mg twice a day for 7 days will send urine for culture and change antibiotic if needed. Continue to take Azo and increase fluid intake.  Orders: -     POCT Urinalysis Dipstick (Automated) -     Urine Culture  Osteopenia after menopause -     Denosumab  Other orders -     Levothyroxine Sodium; TAKE 1 TABLET EVERY DAY ON EMPTY STOMACHWITH A GLASS OF WATER AT LEAST 30-60 MINBEFORE BREAKFAST  Dispense: 90 tablet; Refill: 1 -     Sertraline HCl; Take 1 tablet (50 mg total) by mouth daily.  Dispense: 90 tablet; Refill: 3 -     Cephalexin; Take 1 capsule (500 mg total) by mouth 2 (two) times daily for 7 days.  Dispense: 14 capsule; Refill: 0    Follow-up: Return if symptoms worsen or fail to improve.   Kara Dies, NP

## 2023-02-28 NOTE — Patient Instructions (Addendum)
The urine shows some infection. Sent cephalexin 500 mg twice a day for 7 days. Will send urine for culture and change medication if needed.

## 2023-02-28 NOTE — Assessment & Plan Note (Signed)
Urine positive for leukocytes, nitrites, Protein and glucose Started on cephalexin 500 mg twice a day for 7 days will send urine for culture and change antibiotic if needed. Continue to take Azo and increase fluid intake.

## 2023-03-01 LAB — URINE CULTURE
MICRO NUMBER:: 14965820
SPECIMEN QUALITY:: ADEQUATE

## 2023-03-26 NOTE — Progress Notes (Unsigned)
Bethanie Dicker, NP-C Phone: [phone]  Carol [name] is a 77 y.o. female who presents today for UTI symptoms.  Patient with dysuria for the last 6 days. She has been taking OTC Azo with relief. She does have some bladder pressure. She does not have a history of recurrent UTIs. She was treated for similar symptoms last month with Cephalexin, however her urine culture revealed no bacteria growth.   UTI:  Dysuria- Yes  Frequency- Yes   Urgency- Yes   Hematuria- No   Fever- No  Abd pain- No   Vaginal d/c- No   Social History   Tobacco Use  Smoking Status Former   Packs/day: 1.00   Years: 40.00   Additional pack years: 0.00   Total pack years: 40.00   Types: Cigarettes   Quit date: 04/02/2003   Years since quitting: 19.9  Smokeless Tobacco Never  Tobacco Comments   7-10 cigarettes for past 40 years     Current Outpatient Medications on File Prior to Visit  Medication Sig Dispense Refill   Ascorbic Acid (VITAMIN C) 1000 MG tablet Take 1,000 mg by mouth daily.      aspirin 81 MG tablet Take 81 mg by mouth daily.      atorvastatin (LIPITOR) 40 MG tablet TAKE 1 TABLET BY MOUTH DAILY 90 tablet 1   cetirizine (ZYRTEC) 10 MG tablet Take 10 mg by mouth every morning.      Cholecalciferol (VITAMIN D3) 2000 UNITS capsule Take 2,000 Units by mouth daily.       Coenzyme Q10 (COQ-10) 200 MG CAPS Take 200 mg by mouth daily.     furosemide (LASIX) 20 MG tablet TAKE ONE TABLET TWICE DAILY 180 tablet 3   Glucosamine Sulfate 1000 MG CAPS Take 1,000 mg by mouth daily.     letrozole (FEMARA) 2.5 MG tablet TAKE ONE TABLET EVERY DAY 90 tablet 3   levothyroxine (SYNTHROID) 50 MCG tablet TAKE 1 TABLET EVERY DAY ON EMPTY STOMACHWITH A GLASS OF WATER AT LEAST 30-60 MINBEFORE BREAKFAST 90 tablet 1   Misc Natural Products (OSTEO BI-FLEX JOINT SHIELD) TABS Take 1 tablet by mouth daily.      Olopatadine HCl (PATADAY OP) Place 1 drop into both eyes daily as needed (allergies).     Polyvinyl  Alcohol-Povidone (REFRESH OP) Place 1 drop into both eyes daily as needed (dry eyes).     rOPINIRole (REQUIP) 0.5 MG tablet TAKE ONE TABLET AT BEDTIME 90 tablet 3   senna (SENOKOT) 8.6 MG tablet Take 12 tablets by mouth at bedtime.      sertraline (ZOLOFT) 50 MG tablet Take 1 tablet (50 mg total) by mouth daily. 90 tablet 3   No current facility-administered medications on file prior to visit.    ROS see history of present illness  Objective  Physical Exam Vitals:   03/27/23 0817  BP: 132/68  Pulse: 75  Temp: 98.3 F (36.8 C)  SpO2: 95%    BP Readings from Last 3 Encounters:  03/27/23 132/68  02/28/23 126/74  10/10/22 136/78   Wt Readings from Last 3 Encounters:  03/27/23 223 lb (101.2 kg)  02/28/23 223 lb 9.6 oz (101.4 kg)  10/10/22 218 lb 12.8 oz (99.2 kg)    Physical Exam Constitutional:      General: She is not in acute distress.    Appearance: Normal appearance.  HENT:     Head: Normocephalic.  Cardiovascular:     Rate and Rhythm: Normal rate and regular rhythm.  Heart sounds: Normal heart sounds.  Pulmonary:     Effort: Pulmonary effort is normal.     Breath sounds: Normal breath sounds.  Skin:    General: Skin is warm and dry.  Neurological:     General: No focal deficit present.     Mental Status: She is alert.  Psychiatric:        Mood and Affect: Mood normal.        Behavior: Behavior normal.    Assessment/Plan: Please see individual problem list.  Dysuria Assessment & Plan: Symptoms x 6 days. Unable to do UA in office due to patient taking OTC Azo. Microscopic and culture pending. Will go ahead and treat with Macrobid 100 mg BID x 5 days. Will contact patient with results of culture and change antibiotic if indicated. Encouraged adequate fluid intake. Return precautions given to patient.   Orders: -     Urinalysis, Routine w reflex microscopic -     Urine Culture -     Nitrofurantoin Monohyd Macro; Take 1 capsule (100 mg total) by mouth 2  (two) times daily.  Dispense: 10 capsule; Refill: 0    Return if symptoms worsen or fail to improve.   Bethanie Dicker, NP-C [facility] [employer]

## 2023-03-27 ENCOUNTER — Encounter: Payer: Self-pay | Admitting: Nurse Practitioner

## 2023-03-27 ENCOUNTER — Ambulatory Visit (INDEPENDENT_AMBULATORY_CARE_PROVIDER_SITE_OTHER): Payer: Medicare HMO | Admitting: Nurse Practitioner

## 2023-03-27 VITALS — BP 132/68 | HR 75 | Temp 98.3°F | Ht 62.0 in | Wt 223.0 lb

## 2023-03-27 DIAGNOSIS — R300 Dysuria: Secondary | ICD-10-CM

## 2023-03-27 LAB — URINALYSIS, ROUTINE W REFLEX MICROSCOPIC

## 2023-03-27 MED ORDER — NITROFURANTOIN MONOHYD MACRO 100 MG PO CAPS
100.0000 mg | ORAL_CAPSULE | Freq: Two times a day (BID) | ORAL | 0 refills | Status: DC
Start: 2023-03-27 — End: 2023-10-18

## 2023-03-27 NOTE — Assessment & Plan Note (Signed)
Symptoms x 6 days. Unable to do UA in office due to patient taking OTC Azo. Microscopic and culture pending. Will go ahead and treat with Macrobid 100 mg BID x 5 days. Will contact patient with results of culture and change antibiotic if indicated. Encouraged adequate fluid intake. Return precautions given to patient.

## 2023-03-28 LAB — URINE CULTURE
MICRO NUMBER:: 15073538
Result:: NO GROWTH
SPECIMEN QUALITY:: ADEQUATE

## 2023-04-08 ENCOUNTER — Other Ambulatory Visit (INDEPENDENT_AMBULATORY_CARE_PROVIDER_SITE_OTHER): Payer: Medicare HMO

## 2023-04-08 DIAGNOSIS — E1169 Type 2 diabetes mellitus with other specified complication: Secondary | ICD-10-CM | POA: Diagnosis not present

## 2023-04-08 DIAGNOSIS — E785 Hyperlipidemia, unspecified: Secondary | ICD-10-CM | POA: Diagnosis not present

## 2023-04-08 LAB — COMPREHENSIVE METABOLIC PANEL
ALT: 17 U/L (ref 0–35)
AST: 17 U/L (ref 0–37)
Albumin: 4 g/dL (ref 3.5–5.2)
Alkaline Phosphatase: 61 U/L (ref 39–117)
BUN: 12 mg/dL (ref 6–23)
CO2: 31 mEq/L (ref 19–32)
Calcium: 9.1 mg/dL (ref 8.4–10.5)
Chloride: 103 mEq/L (ref 96–112)
Creatinine, Ser: 0.93 mg/dL (ref 0.40–1.20)
GFR: 59.91 mL/min — ABNORMAL LOW (ref >60.00)
Glucose, Bld: 142 mg/dL — ABNORMAL HIGH (ref 70–99)
Potassium: 3.9 mEq/L (ref 3.5–5.1)
Sodium: 144 mEq/L (ref 135–145)
Total Bilirubin: 0.6 mg/dL (ref 0.2–1.2)
Total Protein: 6.6 g/dL (ref 6.0–8.3)

## 2023-04-08 LAB — MICROALBUMIN / CREATININE URINE RATIO
Creatinine,U: 166.6 mg/dL
Microalb Creat Ratio: 0.4 mg/g (ref 0.0–30.0)
Microalb, Ur: <0.7 mg/dL (ref 0.0–1.9)

## 2023-04-08 LAB — HEMOGLOBIN A1C: Hgb A1c MFr Bld: 6 % (ref 4.6–6.5)

## 2023-04-09 LAB — LIPID PANEL W/REFLEX DIRECT LDL
Cholesterol: 131 mg/dL (ref <200)
HDL: 62 mg/dL (ref >=50)
LDL Cholesterol (Calc): 51 mg/dL (calc)
Non-HDL Cholesterol (Calc): 69 mg/dL (calc) (ref <130)
Total CHOL/HDL Ratio: 2.1 (calc) (ref <5.0)
Triglycerides: 96 mg/dL (ref <150)

## 2023-04-11 ENCOUNTER — Encounter: Payer: Self-pay | Admitting: Internal Medicine

## 2023-04-11 ENCOUNTER — Ambulatory Visit (INDEPENDENT_AMBULATORY_CARE_PROVIDER_SITE_OTHER): Payer: Medicare HMO | Admitting: Internal Medicine

## 2023-04-11 VITALS — BP 126/74 | HR 74 | Temp 98.2°F | Resp 16 | Ht 62.5 in | Wt 222.6 lb

## 2023-04-11 DIAGNOSIS — R928 Other abnormal and inconclusive findings on diagnostic imaging of breast: Secondary | ICD-10-CM

## 2023-04-11 DIAGNOSIS — I2584 Coronary atherosclerosis due to calcified coronary lesion: Secondary | ICD-10-CM

## 2023-04-11 DIAGNOSIS — E785 Hyperlipidemia, unspecified: Secondary | ICD-10-CM

## 2023-04-11 DIAGNOSIS — Z853 Personal history of malignant neoplasm of breast: Secondary | ICD-10-CM

## 2023-04-11 DIAGNOSIS — R944 Abnormal results of kidney function studies: Secondary | ICD-10-CM | POA: Diagnosis not present

## 2023-04-11 DIAGNOSIS — I2510 Atherosclerotic heart disease of native coronary artery without angina pectoris: Secondary | ICD-10-CM

## 2023-04-11 DIAGNOSIS — R300 Dysuria: Secondary | ICD-10-CM

## 2023-04-11 DIAGNOSIS — I700 Atherosclerosis of aorta: Secondary | ICD-10-CM

## 2023-04-11 DIAGNOSIS — E1169 Type 2 diabetes mellitus with other specified complication: Secondary | ICD-10-CM

## 2023-04-11 LAB — BASIC METABOLIC PANEL
BUN: 9 mg/dL (ref 6–23)
CO2: 34 mEq/L — ABNORMAL HIGH (ref 19–32)
Calcium: 9.2 mg/dL (ref 8.4–10.5)
Chloride: 101 mEq/L (ref 96–112)
Creatinine, Ser: 0.88 mg/dL (ref 0.40–1.20)
GFR: 64.01 mL/min (ref >60.00)
Glucose, Bld: 124 mg/dL — ABNORMAL HIGH (ref 70–99)
Potassium: 3.7 mEq/L (ref 3.5–5.1)
Sodium: 142 mEq/L (ref 135–145)

## 2023-04-11 NOTE — Patient Instructions (Addendum)
I WANT YOU TO CONSIDER STARTING OZEMPIC OR MOUNJARO TO HELP YOU LOSE WEIGHT   I HAVE MADE A REFERRAL TO DR CINTRON -DIAZ FOR THE BREAST BIOPSY   CALL us IF YOU HAVE NOT BEEN CONTACTED BY NEXT FRIDAY  THE NEXT TIME YOU HAVE BURNING WITH URINATION , PLEASE COLLECT A URINE SAMPLE USING THE STERILE CONTAINER I HAVE GIVEN YOU TODAY.  DO NOT TAKE AZO OR ANY OTHER BLADDER PAIN MEDICINE BEFORE YOU COLLECT THE URINE

## 2023-04-11 NOTE — Progress Notes (Addendum)
Subjective:  Patient ID: Carol [name], female    DOB: [birth year]  Age: 77 y.o. MRN: [id number]  CC: The primary encounter diagnosis was History of breast cancer. Diagnoses of Abnormal mammogram of right breast, Decreased GFR, Dysuria, Abdominal aortic atherosclerosis (HCC), Coronary atherosclerosis due to calcified coronary lesion, Hyperlipidemia associated with type 2 diabetes mellitus (HCC), and Morbid obesity (HCC) were also pertinent to this visit.   HPI Carol [name] presents for  Chief Complaint  Patient presents with   Medical Management of Chronic Issues    1) T2DM:  T2DM:  She  feels generally well,  But is not  exercising regularly or trying to lose weight. Checking  blood sugars less than once daily at variable times, usually only if she feels she may be having a hypoglycemic event. .  BS have been under 130 fasting and < 150 post prandially.  Denies any recent hypoglyemic events.  Following a carbohydrate modified diet 6 days per week. Denies numbness, burning and tingling of extremities. Appetite is good.  she has modified her diet    BMI stable but reflective of  severe obesity(BMI 40) . NO HISTORY OF PANCREATITIS  OR THYROID CANCER .  Taking a statin and ARB. Discussed GLP 1 agonist therapy today; she is contemplative    2) abnormal mammogram on right Dec 2022 >  Carol [name] has a H/o Breast CA and was seen by Adela Glimpse ,  her former Careers adviser in December for planned biopsy of area on right breast but she was unable to schedule the biopsy before he retired.  She was not referred to another surgeon and has been essentially lost to follow up for the past year.  She still desires to have a biopsy presumed to be scar tissue but needing biopsy  however Dr Lemar Livings was unable to schedule her biopsy before he retired in December  and she has been lost to surgical follow up  for over a year .  She is ready to go forward with biopsy  .  She is still taking Femara    3)  URINARY FREQUENCY .  HAD DYSURIA 2 WEEKS AGO,   TESTED,   NO UTI BY JUNE 12 LABS  BUT HAD TAKEN SOMETHING  FOR PAIN (OTC)  prior to collection and the  UA HARD TO INTERPRET.  NOW HAS A TINGLING SENSATION ON THE URETHRA INTERMITTENTLY. , not burning. Which occurs MOST OF THE TIME WHEN LYING SUPINE  NOT WHEN URINATING . SOME STRESS INCONTINENCE BUT ONLY WHEN BLADDER IS FULL.   4):  Hypertension: patient checks blood pressure one or twice monthly  at home.  Readings have been for the most part < 140/80 at rest . Patient is following a reduced salt diet most days and is taking medications as prescribed   Lab Results  Component Value Date   HGBA1C 6.0 04/08/2023     Outpatient Medications Prior to Visit  Medication Sig Dispense Refill   Ascorbic Acid (VITAMIN C) 1000 MG tablet Take 1,000 mg by mouth daily.      aspirin 81 MG tablet Take 81 mg by mouth daily.      atorvastatin (LIPITOR) 40 MG tablet TAKE 1 TABLET BY MOUTH DAILY 90 tablet 1   cetirizine (ZYRTEC) 10 MG tablet Take 10 mg by mouth every morning.      Cholecalciferol (VITAMIN D3) 2000 UNITS capsule Take 2,000 Units by mouth daily.       Coenzyme Q10 (COQ-10) 200 MG CAPS  Take 200 mg by mouth daily.     furosemide (LASIX) 20 MG tablet TAKE ONE TABLET TWICE DAILY 180 tablet 3   Glucosamine Sulfate 1000 MG CAPS Take 1,000 mg by mouth daily.     letrozole (FEMARA) 2.5 MG tablet TAKE ONE TABLET EVERY DAY 90 tablet 3   levothyroxine (SYNTHROID) 50 MCG tablet TAKE 1 TABLET EVERY DAY ON EMPTY STOMACHWITH A GLASS OF WATER AT LEAST 30-60 MINBEFORE BREAKFAST 90 tablet 1   Misc Natural Products (OSTEO BI-FLEX JOINT SHIELD) TABS Take 1 tablet by mouth daily.      nitrofurantoin, macrocrystal-monohydrate, (MACROBID) 100 MG capsule Take 1 capsule (100 mg total) by mouth 2 (two) times daily. 10 capsule 0   Olopatadine HCl (PATADAY OP) Place 1 drop into both eyes daily as needed (allergies).     Polyvinyl Alcohol-Povidone (REFRESH OP) Place 1 drop into  both eyes daily as needed (dry eyes).     rOPINIRole (REQUIP) 0.5 MG tablet TAKE ONE TABLET AT BEDTIME 90 tablet 3   senna (SENOKOT) 8.6 MG tablet Take 12 tablets by mouth at bedtime.      sertraline (ZOLOFT) 50 MG tablet Take 1 tablet (50 mg total) by mouth daily. 90 tablet 3   No facility-administered medications prior to visit.    Review of Systems;  Patient denies headache, fevers, malaise, unintentional weight loss, skin rash, eye pain, sinus congestion and sinus pain, sore throat, dysphagia,  hemoptysis , cough, dyspnea, wheezing, chest pain, palpitations, orthopnea, edema, abdominal pain, nausea, melena, diarrhea, constipation, flank pain, dysuria, hematuria, urinary  Frequency, nocturia, numbness, tingling, seizures,  Focal weakness, Loss of consciousness,  Tremor, insomnia, depression, anxiety, and suicidal ideation.      Objective:  BP 126/74   Pulse 74   Temp 98.2 F (36.8 C)   Resp 16   Ht 5' 2.5" (1.588 m)   Wt 222 lb 9.6 oz (101 kg)   SpO2 97%   BMI 40.07 kg/m   BP Readings from Last 3 Encounters:  04/11/23 126/74  03/27/23 132/68  02/28/23 126/74    Wt Readings from Last 3 Encounters:  04/11/23 222 lb 9.6 oz (101 kg)  03/27/23 223 lb (101.2 kg)  02/28/23 223 lb 9.6 oz (101.4 kg)    Physical Exam Vitals reviewed.  Constitutional:      General: She is not in acute distress.    Appearance: Normal appearance. She is normal weight. She is not ill-appearing, toxic-appearing or diaphoretic.  HENT:     Head: Normocephalic.  Eyes:     General: No scleral icterus.       Right eye: No discharge.        Left eye: No discharge.     Conjunctiva/sclera: Conjunctivae normal.  Cardiovascular:     Rate and Rhythm: Normal rate and regular rhythm.     Heart sounds: Normal heart sounds.  Pulmonary:     Effort: Pulmonary effort is normal. No respiratory distress.     Breath sounds: Normal breath sounds.  Musculoskeletal:        General: Normal range of motion.   Skin:    General: Skin is warm and dry.  Neurological:     General: No focal deficit present.     Mental Status: She is alert and oriented to person, place, and time. Mental status is at baseline.  Psychiatric:        Mood and Affect: Mood normal.        Behavior: Behavior normal.  Thought Content: Thought content normal.        Judgment: Judgment normal.     Lab Results  Component Value Date   HGBA1C 6.0 04/08/2023   HGBA1C 6.3 10/04/2022   HGBA1C 6.3 01/18/2022    Lab Results  Component Value Date   CREATININE 0.88 04/11/2023   CREATININE 0.93 04/08/2023   CREATININE 0.85 10/04/2022    Lab Results  Component Value Date   WBC 5.5 10/04/2022   HGB 14.2 10/04/2022   HCT 41.3 10/04/2022   PLT 195.0 10/04/2022   GLUCOSE 124 (H) 04/11/2023   CHOL 131 04/08/2023   TRIG 96 04/08/2023   HDL 62 04/08/2023   LDLDIRECT 58.0 10/04/2022   LDLCALC 51 04/08/2023   ALT 17 04/08/2023   AST 17 04/08/2023   NA 142 04/11/2023   K 3.7 04/11/2023   CL 101 04/11/2023   CREATININE 0.88 04/11/2023   BUN 9 04/11/2023   CO2 34 (H) 04/11/2023   TSH 2.66 10/04/2022   HGBA1C 6.0 04/08/2023   MICROALBUR <0.7 04/08/2023    DG Bone Density  Result Date: 08/23/2022 EXAM: DUAL X-RAY ABSORPTIOMETRY (DXA) FOR BONE MINERAL DENSITY IMPRESSION: Your patient Carol [name] completed a BMD test on 08/23/2022 using the [employer] DXA System (software version: 14.10) manufactured by [employer]. The following summarizes the results of our evaluation. Technologist::TNB PATIENT BIOGRAPHICAL: Name: Carol [name], Carol [name] Patient ID: [id number] Birth Date: 01/02/1946 Height: 62.0 in. Gender: Female Exam Date: 08/23/2022 Weight: 215.2 lbs. Indications: Advanced Age, Caucasian, Family Hx of Osteoporosis, Height Loss, High Risk Meds, History of Breast Cancer, History of Radiation, History of Spinal Surgery, Postmenopausal, Tobacco User (Current Smoker) Fractures: Treatments: Vit D, Calcium,  Letrozole DENSITOMETRY RESULTS: Site         Region     Measured Date Measured Age WHO Classification Young Adult T-score BMD         %Change vs. Previous Significant Change (*) DualFemur Neck Left 08/23/2023 75.3 Osteopenia -1.5 0.824 g/cm2 11.1% Yes DualFemur Neck Left 02/05/2018 70.8 Osteopenia -2.1 0.742 g/cm2 -1.6% - DualFemur Neck Left 02/01/2016 68.8 Osteopenia -2.0 0.754 g/cm2 - - DualFemur Total Mean 08/23/2022 75.3 Normal -0.3 0.968 g/cm2 9.5% Yes DualFemur Total Mean 02/05/2018 70.8 Normal -1.0 0.884 g/cm2 -2.0% Yes DualFemur Total Mean 02/01/2016 68.8 Normal -0.8 0.902 g/cm2 - - Left Forearm Radius 33% 08/23/2022 75.3 Normal -0.8 0.810 g/cm2 2.9% - Left Forearm Radius 33% 02/05/2018 70.8 Normal -1.0 0.787 g/cm2 - - ASSESSMENT: The BMD measured at Femur Neck Left is 0.824 g/cm2 with a T-score of -1.5. This patient is considered osteopenic according to World Health Organization [facility]) criteria. Compared with prior study, there has been significant increase in the left hip. Compared with prior study, there has been significant increase in the total mean. Compared with prior study, there has been no significant change in the left forearm. Lumbar spine was not utilized due to history of surgical repair. Patient is not a candidate for FRAX due to Prolia injections. The scan quality is good. World Science writer [facility]) criteria for post-menopausal, Caucasian Women: Normal:                   T-score at or above -1 SD Osteopenia/low bone mass: T-score between -1 and -2.5 SD Osteoporosis:             T-score at or below -2.5 SD RECOMMENDATIONS: 1. All patients should optimize calcium and vitamin D intake. 2. Consider FDA-approved medical therapies in postmenopausal women and  men aged 62 years and older, based on the following: a. A hip or vertebral(clinical or morphometric) fracture b. T-score < -2.5 at the femoral neck or spine after appropriate evaluation to exclude secondary causes c. Low bone mass (T-score  between -1.0 and -2.5 at the femoral neck or spine) and a 10-year probability of a hip fracture > 3% or a 10-year probability of a major osteoporosis-related fracture > 20% based on the [place]-adapted WHO algorithm 3. Clinician judgment and/or patient preferences may indicate treatment for people with 10-year fracture probabilities above or below these levels FOLLOW-UP: People with diagnosed cases of osteoporosis or at high risk for fracture should have regular bone mineral density tests. For patients eligible for Medicare, routine testing is allowed once every 2 years. The testing frequency can be increased to one year for patients who have rapidly progressing disease, those who are receiving or discontinuing medical therapy to restore bone mass, or have additional risk factors. I have reviewed this report, and agree with the above findings. [facility] Electronically Signed   By: Frederico Hamman M.D.   On: 08/23/2022 13:54    Assessment & Plan:  .History of breast cancer Assessment & Plan: Diagnosed in march 2019  Treated with lumpectomy and XRtT.  Taking Femara.  No recurrence thus far; However  . she has had a lateral mass for the the last  1.5  years that has not been  biopsied yet due to illness.  Her breast surgeon has retired and she has been lost to surgical follow up.  Referring to Dr Hazle Quant  for biopsy  Orders: -     Ambulatory referral to General Surgery  Abnormal mammogram of right breast -     Ambulatory referral to General Surgery  Decreased GFR -     Basic metabolic panel  Dysuria -     Urinalysis, Routine w reflex microscopic; Future -     Urine Culture; Future  Abdominal aortic atherosclerosis (HCC) Assessment & Plan: CT reviewed with patient . She is tolerating atorvastatin taken every other day Lab Results  Component Value Date   CHOL 131 04/08/2023   HDL 62 04/08/2023   LDLCALC 51 04/08/2023   LDLDIRECT 58.0 10/04/2022   TRIG 96 04/08/2023    CHOLHDL 2.1 04/08/2023      Coronary atherosclerosis due to calcified coronary lesion Assessment & Plan: Asymptomatic . Encouraged to continue statin and asa and resume daily walking. Discussed the benefits of SGLT2 inhivitors  and GLP 1 agonists in patients  with DDM and CAD. Marland Kitchen  She remains contemplative  and has deferred additional medication today    Hyperlipidemia associated with type 2 diabetes mellitus (HCC) Assessment & Plan: Her diabetes remains diet controlled, but her morbid obesity remains problematic,and she has asymptomatic CAD.  .  . She is tolerating every other day atorvastatin.  She has no microalbuminuria.  Reviewed the benefits of SGLT 2 inhibitors and GLP 1 agonists with her today .  She remains ambivalent to adding medications  and has deferred decision .  Return in 3 months    Lab Results  Component Value Date   HGBA1C 6.0 04/08/2023   Lab Results  Component Value Date   LABMICR <3.0 03/06/2017   MICROALBUR <0.7 04/08/2023   MICROALBUR 1.0 01/18/2022   Lab Results  Component Value Date   CHOL 131 04/08/2023   HDL 62 04/08/2023   LDLCALC 51 04/08/2023   LDLDIRECT 58.0 10/04/2022   TRIG 96 04/08/2023  CHOLHDL 2.1 04/08/2023        Morbid obesity (HCC) Assessment & Plan: Patient has been unable to lose or maintain a healthy weight despite good effort. Encouraged to increase exercise involvement to include more intense aerobic activity for 30 minutes 5 days per week.  Screened for contraindications to use of  GLP 1 agonists for appetite suppression and she has none.  The risks and benefits of pharmacotherapy discussed , but she has deferred        I provided 30 minutes of face-to-face time during this encounter reviewing patient's last visit with me, patient's  most recent visit with general surgery,  neurology,  recent surgical and non surgical procedures, previous  labs and imaging studies, counseling on currently addressed issues,  and post visit  ordering to diagnostics and therapeutics .   Follow-up: Return in about 3 months (around 07/12/2023) for follow up diabetes.   Sherlene Shams, MD

## 2023-04-13 NOTE — Assessment & Plan Note (Signed)
CT reviewed with patient . She is tolerating atorvastatin taken every other day Lab Results  Component Value Date   CHOL 131 04/08/2023   HDL 62 04/08/2023   LDLCALC 51 04/08/2023   LDLDIRECT 58.0 10/04/2022   TRIG 96 04/08/2023   CHOLHDL 2.1 04/08/2023

## 2023-04-13 NOTE — Assessment & Plan Note (Addendum)
Asymptomatic . Encouraged to continue statin and asa and resume daily walking. Discussed the benefits of SGLT2 inhivitors  and GLP 1 agonists in patients  with DDM and CAD. Marland Kitchen  She remains contemplative  and has deferred additional medication today

## 2023-04-13 NOTE — Assessment & Plan Note (Signed)
Her diabetes remains diet controlled, but her morbid obesity remains problematic,and she has asymptomatic CAD.  .  . She is tolerating every other day atorvastatin.  She has no microalbuminuria.  Reviewed the benefits of SGLT 2 inhibitors and GLP 1 agonists with her today .  She remains ambivalent to adding medications  and has deferred decision .  Return in 3 months    Lab Results  Component Value Date   HGBA1C 6.0 04/08/2023   Lab Results  Component Value Date   LABMICR <3.0 03/06/2017   MICROALBUR <0.7 04/08/2023   MICROALBUR 1.0 01/18/2022   Lab Results  Component Value Date   CHOL 131 04/08/2023   HDL 62 04/08/2023   LDLCALC 51 04/08/2023   LDLDIRECT 58.0 10/04/2022   TRIG 96 04/08/2023   CHOLHDL 2.1 04/08/2023

## 2023-04-13 NOTE — Assessment & Plan Note (Signed)
Patient has been unable to lose or maintain a healthy weight despite good effort. Encouraged to increase exercise involvement to include more intense aerobic activity for 30 minutes 5 days per week.  Screened for contraindications to use of  GLP 1 agonists for appetite suppression and she has none.  The risks and benefits of pharmacotherapy discussed , but she has deferred

## 2023-04-13 NOTE — Assessment & Plan Note (Signed)
Diagnosed in march 2019  Treated with lumpectomy and XRtT.  Taking Femara.  No recurrence thus far; However  . she has had a lateral mass for the the last  1.5  years that has not been  biopsied yet due to illness.  Her breast surgeon has retired and she has been lost to surgical follow up.  Referring to Dr Hazle Quant  for biopsy

## 2023-05-02 DIAGNOSIS — E119 Type 2 diabetes mellitus without complications: Secondary | ICD-10-CM | POA: Diagnosis not present

## 2023-05-02 DIAGNOSIS — Z0100 Encounter for examination of eyes and vision without abnormal findings: Secondary | ICD-10-CM | POA: Diagnosis not present

## 2023-05-02 DIAGNOSIS — Z961 Presence of intraocular lens: Secondary | ICD-10-CM | POA: Diagnosis not present

## 2023-05-02 DIAGNOSIS — H43813 Vitreous degeneration, bilateral: Secondary | ICD-10-CM | POA: Diagnosis not present

## 2023-05-02 LAB — HM DIABETES EYE EXAM

## 2023-05-03 ENCOUNTER — Ambulatory Visit
Admission: RE | Admit: 2023-05-03 | Discharge: 2023-05-03 | Disposition: A | Discharge status: Home / Self Care | Payer: Medicare HMO | Source: Ambulatory Visit | Attending: Internal Medicine | Admitting: Internal Medicine

## 2023-05-03 DIAGNOSIS — Z853 Personal history of malignant neoplasm of breast: Secondary | ICD-10-CM | POA: Diagnosis not present

## 2023-05-03 DIAGNOSIS — R92323 Mammographic fibroglandular density, bilateral breasts: Secondary | ICD-10-CM | POA: Diagnosis not present

## 2023-05-03 DIAGNOSIS — N6310 Unspecified lump in the right breast, unspecified quadrant: Secondary | ICD-10-CM | POA: Diagnosis not present

## 2023-05-03 DIAGNOSIS — R928 Other abnormal and inconclusive findings on diagnostic imaging of breast: Secondary | ICD-10-CM | POA: Diagnosis not present

## 2023-05-07 ENCOUNTER — Other Ambulatory Visit: Payer: Self-pay | Admitting: Internal Medicine

## 2023-05-30 ENCOUNTER — Encounter (INDEPENDENT_AMBULATORY_CARE_PROVIDER_SITE_OTHER): Payer: Self-pay

## 2023-07-12 ENCOUNTER — Ambulatory Visit: Payer: Medicare HMO | Admitting: Internal Medicine

## 2023-08-07 ENCOUNTER — Telehealth: Payer: Self-pay | Admitting: *Deleted

## 2023-08-07 NOTE — Telephone Encounter (Signed)
$  95 due; PA on file  Due on or after 08/31/23  Sent MyChart message to schedule   Patient Advocate Encounter   Prior Authorization for Prolia 60MG /ML syringes has been approved with [employer].     PA# [id number] Effective dates: 02/25/23 through 10/15/23   Per [facility] test claim, copay for 180 days supply is $95

## 2023-08-22 DIAGNOSIS — L729 Follicular cyst of the skin and subcutaneous tissue, unspecified: Secondary | ICD-10-CM | POA: Diagnosis not present

## 2023-08-22 DIAGNOSIS — L03311 Cellulitis of abdominal wall: Secondary | ICD-10-CM | POA: Diagnosis not present

## 2023-08-22 DIAGNOSIS — L02211 Cutaneous abscess of abdominal wall: Secondary | ICD-10-CM | POA: Diagnosis not present

## 2023-08-22 NOTE — Telephone Encounter (Signed)
Pharmacy Patient Advocate Encounter  Received notification from [facility] that Prior Authorization for Prolia  has been APPROVED from 10/16/23 to 10/14/24   PA #/Case ID/Reference #: [id number]  Approval letter indexed to media tab

## 2023-08-31 DIAGNOSIS — L03311 Cellulitis of abdominal wall: Secondary | ICD-10-CM | POA: Diagnosis not present

## 2023-08-31 DIAGNOSIS — L729 Follicular cyst of the skin and subcutaneous tissue, unspecified: Secondary | ICD-10-CM | POA: Diagnosis not present

## 2023-08-31 DIAGNOSIS — L02211 Cutaneous abscess of abdominal wall: Secondary | ICD-10-CM | POA: Diagnosis not present

## 2023-09-19 ENCOUNTER — Other Ambulatory Visit: Payer: Self-pay | Admitting: Internal Medicine

## 2023-09-19 DIAGNOSIS — I878 Other specified disorders of veins: Secondary | ICD-10-CM

## 2023-09-19 DIAGNOSIS — G2581 Restless legs syndrome: Secondary | ICD-10-CM

## 2023-09-26 MED ORDER — DENOSUMAB 60 MG/ML ~~LOC~~ SOSY
60.0000 mg | PREFILLED_SYRINGE | Freq: Once | SUBCUTANEOUS | Status: AC
Start: 2023-09-26 — End: 2023-09-27
  Administered 2023-09-27: 60 mg via SUBCUTANEOUS

## 2023-09-26 NOTE — Addendum Note (Signed)
Addended by: Warden Fillers on: 09/26/2023 11:30 AM   Modules accepted: Orders

## 2023-09-26 NOTE — Telephone Encounter (Signed)
Patient states she believes she is past due for her Prolia shot and we did not call her.  I let patient know that Thurmond Butts, CMA, sent her a message via MyChart and I read that message to patient.  Patient states she doesn't check MyChart and her husband checks it once a month.  I scheduled appointment for patient to have her prolia injection tomorrow.

## 2023-09-26 NOTE — Telephone Encounter (Signed)
I use mychart due to the patient load I have to reach out to. Pt can schedule appt every 6 months after each injection.

## 2023-09-27 ENCOUNTER — Ambulatory Visit: Payer: Medicare HMO

## 2023-09-27 DIAGNOSIS — Z780 Asymptomatic menopausal state: Secondary | ICD-10-CM

## 2023-09-27 DIAGNOSIS — M85852 Other specified disorders of bone density and structure, left thigh: Secondary | ICD-10-CM

## 2023-09-27 DIAGNOSIS — M8580 Other specified disorders of bone density and structure, unspecified site: Secondary | ICD-10-CM | POA: Diagnosis not present

## 2023-09-27 MED ORDER — DENOSUMAB 60 MG/ML ~~LOC~~ SOSY
60.0000 mg | PREFILLED_SYRINGE | Freq: Once | SUBCUTANEOUS | Status: AC
Start: 2024-03-27 — End: ?
  Administered 2024-04-01: 60 mg via SUBCUTANEOUS

## 2023-09-27 NOTE — Progress Notes (Signed)
Patient presented for Prolia injection to right arm, patient voiced no concerns nor showed any signs of distress during injection

## 2023-10-10 ENCOUNTER — Telehealth: Payer: Self-pay | Admitting: Internal Medicine

## 2023-10-10 DIAGNOSIS — E1169 Type 2 diabetes mellitus with other specified complication: Secondary | ICD-10-CM

## 2023-10-10 NOTE — Telephone Encounter (Signed)
Patient need lab orders. The urine order is there but the notes states Lab and Urine.

## 2023-10-14 ENCOUNTER — Other Ambulatory Visit (INDEPENDENT_AMBULATORY_CARE_PROVIDER_SITE_OTHER): Payer: Medicare HMO

## 2023-10-14 DIAGNOSIS — E785 Hyperlipidemia, unspecified: Secondary | ICD-10-CM | POA: Diagnosis not present

## 2023-10-14 DIAGNOSIS — E1169 Type 2 diabetes mellitus with other specified complication: Secondary | ICD-10-CM | POA: Diagnosis not present

## 2023-10-14 LAB — HEMOGLOBIN A1C: Hgb A1c MFr Bld: 6.5 % (ref 4.6–6.5)

## 2023-10-14 LAB — LIPID PANEL
Cholesterol: 153 mg/dL (ref 0–200)
HDL: 57.7 mg/dL (ref >39.00)
LDL Cholesterol: 59 mg/dL (ref 0–99)
NonHDL: 95.47
Total CHOL/HDL Ratio: 3
Triglycerides: 181 mg/dL — ABNORMAL HIGH (ref 0.0–149.0)
VLDL: 36.2 mg/dL (ref 0.0–40.0)

## 2023-10-14 LAB — COMPREHENSIVE METABOLIC PANEL
ALT: 14 U/L (ref 0–35)
AST: 16 U/L (ref 0–37)
Albumin: 4.3 g/dL (ref 3.5–5.2)
Alkaline Phosphatase: 64 U/L (ref 39–117)
BUN: 8 mg/dL (ref 6–23)
CO2: 33 meq/L — ABNORMAL HIGH (ref 19–32)
Calcium: 8.9 mg/dL (ref 8.4–10.5)
Chloride: 101 meq/L (ref 96–112)
Creatinine, Ser: 0.93 mg/dL (ref 0.40–1.20)
GFR: 59.69 mL/min — ABNORMAL LOW (ref >60.00)
Glucose, Bld: 151 mg/dL — ABNORMAL HIGH (ref 70–99)
Potassium: 3.7 meq/L (ref 3.5–5.1)
Sodium: 144 meq/L (ref 135–145)
Total Bilirubin: 0.8 mg/dL (ref 0.2–1.2)
Total Protein: 6.8 g/dL (ref 6.0–8.3)

## 2023-10-14 LAB — MICROALBUMIN / CREATININE URINE RATIO
Creatinine,U: 297.7 mg/dL
Microalb Creat Ratio: 1.2 mg/g (ref 0.0–30.0)
Microalb, Ur: 3.7 mg/dL — ABNORMAL HIGH (ref 0.0–1.9)

## 2023-10-14 LAB — VITAMIN B12: Vitamin B-12: 171 pg/mL — ABNORMAL LOW (ref 211–911)

## 2023-10-18 ENCOUNTER — Ambulatory Visit (INDEPENDENT_AMBULATORY_CARE_PROVIDER_SITE_OTHER): Payer: Medicare HMO | Admitting: Internal Medicine

## 2023-10-18 ENCOUNTER — Encounter: Payer: Self-pay | Admitting: Internal Medicine

## 2023-10-18 VITALS — BP 140/68 | HR 93 | Ht 62.5 in | Wt 218.8 lb

## 2023-10-18 DIAGNOSIS — E1169 Type 2 diabetes mellitus with other specified complication: Secondary | ICD-10-CM | POA: Diagnosis not present

## 2023-10-18 DIAGNOSIS — E785 Hyperlipidemia, unspecified: Secondary | ICD-10-CM

## 2023-10-18 DIAGNOSIS — E1129 Type 2 diabetes mellitus with other diabetic kidney complication: Secondary | ICD-10-CM

## 2023-10-18 DIAGNOSIS — Z0000 Encounter for general adult medical examination without abnormal findings: Secondary | ICD-10-CM

## 2023-10-18 DIAGNOSIS — Z0001 Encounter for general adult medical examination with abnormal findings: Secondary | ICD-10-CM

## 2023-10-18 DIAGNOSIS — E538 Deficiency of other specified B group vitamins: Secondary | ICD-10-CM | POA: Diagnosis not present

## 2023-10-18 DIAGNOSIS — E039 Hypothyroidism, unspecified: Secondary | ICD-10-CM

## 2023-10-18 MED ORDER — TELMISARTAN 20 MG PO TABS: 20.0000 mg | ORAL_TABLET | Freq: Every day | ORAL | 1 refills | Status: DC

## 2023-10-18 MED ORDER — LEVOTHYROXINE SODIUM 50 MCG PO TABS: ORAL_TABLET | ORAL | 0 refills | Status: DC

## 2023-10-18 MED ORDER — ATORVASTATIN CALCIUM 40 MG PO TABS: 40.0000 mg | ORAL_TABLET | Freq: Every day | ORAL | 1 refills | Status: DC

## 2023-10-18 MED ORDER — CYANOCOBALAMIN 1000 MCG/ML IJ SOLN
1000.0000 ug | Freq: Once | INTRAMUSCULAR | Status: AC
Start: 2023-10-18 — End: 2023-10-18
  Administered 2023-10-18: 1000 ug via INTRAMUSCULAR

## 2023-10-18 NOTE — Patient Instructions (Addendum)
 1)  YOU HAD AN ABDOMINAL WALL ABSCESS.  ( BOIL)  PLEASE  KEEP  YOUR HANDS OFF YOUR BELLY!!  YOUR HANDS ALWAYS HAVE BACTERIA ON  THEM   CONTINUE USING THE OINTMENT    CALL IF YOU DEVELOP REDNESS   OR HEAT OR PAIN   2) your B12 level is low .  THIS MAY REQUIRE LONG TERM INJECTIONS .  ADDITIONAL BLOOD WORK WILL DETERMINE THIS  3) You have microscopic proteinuria.  Microscopic amounts of protein in the urine means that  THE  DIABETES AND HYPERTENSION ARE starting to affect your kidney's ability to  filter protein out of the urine .  I AM STARTING YOU ON TELMISARTAN  TO TAKE ONCE DAILY .  4)RETURN IN ONE WEEK FOR LABS ,  BP CHECK AND 2ND B12 INJECTION

## 2023-10-18 NOTE — Progress Notes (Signed)
 Patient ID: Carol [name], female    DOB: [birth year]  Age: 78 y.o. MRN: [id number]  The patient is here for annual preventive examination and management of other chronic and acute problems.   The risk factors are reflected in the social history.  The roster of all physicians providing medical care to patient - is listed in the Snapshot section of the chart.  Activities of daily living:  The patient is 100% independent in all ADLs: dressing, toileting, feeding as well as independent mobility  Home safety : The patient has smoke detectors in the home. They wear seatbelts.  There are no firearms at home. There is no violence in the home.   There is no risks for hepatitis, STDs or HIV. There is no   history of blood transfusion. They have no travel history to infectious disease endemic areas of the world.  The patient has seen their dentist in the last six month. They have seen their eye doctor in the last year. They admit to slight hearing difficulty with regard to whispered voices and some television programs.  They have deferred audiologic testing in the last year.  They do not  have excessive sun exposure. Discussed the need for sun protection: hats, long sleeves and use of sunscreen if there is significant sun exposure.   Diet: the importance of a healthy diet is discussed. They do have a healthy diet.  The benefits of regular aerobic exercise were discussed. She walks 4 times per week ,  20 minutes.   Depression screen: there are no signs or vegative symptoms of depression- irritability, change in appetite, anhedonia, sadness/tearfullness.  Cognitive assessment: the patient manages all their financial and personal affairs and is actively engaged. They could relate day,date,year and events; recalled 2/3 objects at 3 minutes; performed clock-face test normally.  The following portions of the patient's history were reviewed and updated as appropriate: allergies, current medications,  past family history, past medical history,  past surgical history, past social history  and problem list.  Visual acuity was not assessed per patient preference since she has regular follow up with her ophthalmologist. Hearing and body mass index were assessed and reviewed.   During the course of the visit the patient was educated and counseled about appropriate screening and preventive services including : fall prevention , diabetes screening, nutrition counseling, colorectal cancer screening, and recommended immunizations.    CC: The primary encounter diagnosis was B12 deficiency. Diagnoses of Microalbuminuria due to type 2 diabetes mellitus (HCC), Hypothyroidism (acquired), Encounter for general adult medical examination with abnormal findings, Morbid obesity (HCC), and Hyperlipidemia associated with type 2 diabetes mellitus (HCC) were also pertinent to this visit.  1) type 2 DM:  with obesity:  she has lost 5 lbs since June,  through dietary restrictions/portion reduction   2) history of subcutaneous boil on lower part of abdominal pannus on the left side  that had been present for years as a sub Q nodule but developed inflammation and tenderness when she squeezed it.  After several days she tried  open it up with a needle,  followed by development of a second boil adjacent to it so she went to [facility] .on Nov 7.  Empiric doxycycline  and topical mupirocin prescribed , abscess was incised and drained,  antibiotics were changed to cipro/flagyl when culture grew mixed anaerobes.  Was seen Nob 14 with improvement noted..  /The boil has resolved but continues to be red and not epithelialized yet .  She is keeping it covered and using topical abx but today is repeating stroking it with her bare hand as she recounts past events.    History Carol [name] has a past medical history of Allergic rhinitis, cause unspecified, Arthritis, Bedbug bite (07/22/2021), Breast cancer (HCC) (2019), Bronchitis,  Cataract, Fibromyalgia, Hemorrhagic cystitis (05/25/2020), HLD (hyperlipidemia), Hypothyroidism, Menieres disease (2011), Other abnormal glucose, Papanicolaou smear of cervix with high grade squamous intraepithelial lesion (HGSIL), Personal history of colonic polyps, Personal history of radiation therapy (2019), Pre-diabetes, Presence of dental prosthetic device, Shingles, Swelling of limb, Thyroid  goiter, Tobacco use disorder, and Unspecified sinusitis (chronic).   She has a past surgical history that includes Appendectomy (06/1997); External ear surgery (09/2008); Colposcopy (10/16/2011); Colonoscopy (10/15/2012); Colonoscopy w/ biopsies (03/30/2004); Sentinel node biopsy (Right, 12/23/2017); Colonoscopy with propofol  (N/A, 03/19/2018); Cataract extraction w/PHACO (Left, 07/30/2018); Cataract extraction w/PHACO (Right, 08/27/2018); Eye surgery; Carpal tunnel release (Right, 07/25/2020); Breast biopsy (Right, 11/05/2000); Breast biopsy (Right, 2011); Breast biopsy (Right, 12/02/2017); Mastectomy, partial (Right, 12/23/2017); Maximum access (mas)posterior lumbar interbody fusion (plif) 1 level (07/05/2020); and Carpal tunnel release (Right, 07/05/2020).   Her family history includes Arthritis in an other family member; Asthma in her sister; Breast cancer in her maternal aunt; Coronary artery disease in an other family member; Depression in her sister; Diabetes in her brother and brother; Heart disease in her brother and brother; Heart failure in her brother and father; Hyperlipidemia in her brother, father, and sister; Hypertension in her brother, father, sister, and sister; Stroke in her father.She reports that she quit smoking about 20 years ago. Her smoking use included cigarettes. She started smoking about 60 years ago. She has a 40 pack-year smoking history. She has never used smokeless tobacco. She reports current alcohol use. She reports that she does not use drugs.  Outpatient Medications Prior to  Visit  Medication Sig Dispense Refill   Ascorbic Acid  (VITAMIN C) 1000 MG tablet Take 1,000 mg by mouth daily.      aspirin  81 MG tablet Take 81 mg by mouth daily.      cetirizine (ZYRTEC) 10 MG tablet Take 10 mg by mouth every morning.      Cholecalciferol  (VITAMIN D3) 2000 UNITS capsule Take 2,000 Units by mouth daily.       Coenzyme Q10 (COQ-10) 200 MG CAPS Take 200 mg by mouth daily.     furosemide  (LASIX ) 20 MG tablet TAKE ONE TABLET TWICE DAILY 180 tablet 3   Glucosamine Sulfate 1000 MG CAPS Take 1,000 mg by mouth daily.     letrozole  (FEMARA ) 2.5 MG tablet TAKE ONE TABLET EVERY DAY 90 tablet 3   Misc Natural Products (OSTEO BI-FLEX JOINT SHIELD) TABS Take 1 tablet by mouth daily.      Olopatadine HCl (PATADAY OP) Place 1 drop into both eyes daily as needed (allergies).     Polyvinyl Alcohol-Povidone (REFRESH OP) Place 1 drop into both eyes daily as needed (dry eyes).     rOPINIRole  (REQUIP ) 0.5 MG tablet TAKE ONE TABLET AT BEDTIME 90 tablet 3   senna (SENOKOT) 8.6 MG tablet Take 12 tablets by mouth at bedtime.      sertraline  (ZOLOFT ) 50 MG tablet Take 1 tablet (50 mg total) by mouth daily. 90 tablet 3   nitrofurantoin , macrocrystal-monohydrate, (MACROBID ) 100 MG capsule Take 1 capsule (100 mg total) by mouth 2 (two) times daily. 10 capsule 0   atorvastatin  (LIPITOR) 40 MG tablet TAKE 1 TABLET BY MOUTH DAILY 90 tablet 1   levothyroxine  (SYNTHROID ) 50 MCG  tablet TAKE 1 TABLET EVERY DAY ON EMPTY STOMACHWITH A GLASS OF WATER AT LEAST 30-60 MINBEFORE BREAKFAST 90 tablet 1   Facility-Administered Medications Prior to Visit  Medication Dose Route Frequency Provider Last Rate Last Admin   [START ON 03/27/2024] denosumab  (PROLIA ) injection 60 mg  60 mg Subcutaneous Once Marylynn Verneita CROME, MD        Review of Systems  Patient denies headache, fevers, malaise, unintentional weight loss, skin rash, eye pain, sinus congestion and sinus pain, sore throat, dysphagia,  hemoptysis , cough, dyspnea,  wheezing, chest pain, palpitations, orthopnea, edema, abdominal pain, nausea, melena, diarrhea, constipation, flank pain, dysuria, hematuria, urinary  Frequency, nocturia, numbness, tingling, seizures,  Focal weakness, Loss of consciousness,  Tremor, insomnia, depression, anxiety, and suicidal ideation.     Objective:  BP (!) 140/68   Pulse 93   Ht 5' 2.5 (1.588 m)   Wt 218 lb 12.8 oz (99.2 kg)   SpO2 94%   BMI 39.38 kg/m   Physical Exam Vitals reviewed.  Constitutional:      General: She is not in acute distress.    Appearance: Normal appearance. She is normal weight. She is not ill-appearing, toxic-appearing or diaphoretic.  HENT:     Head: Normocephalic.  Eyes:     General: No scleral icterus.       Right eye: No discharge.        Left eye: No discharge.     Conjunctiva/sclera: Conjunctivae normal.  Cardiovascular:     Rate and Rhythm: Normal rate and regular rhythm.     Heart sounds: Normal heart sounds.  Pulmonary:     Effort: Pulmonary effort is normal. No respiratory distress.     Breath sounds: Normal breath sounds.  Abdominal:     General: Abdomen is protuberant. Bowel sounds are normal.     Palpations: Abdomen is soft.       Comments: Resolving abscesses  with minimal erythema and drainage, 90% reepithelialized   Musculoskeletal:        General: Normal range of motion.  Skin:    General: Skin is warm and dry.  Neurological:     General: No focal deficit present.     Mental Status: She is alert and oriented to person, place, and time. Mental status is at baseline.  Psychiatric:        Mood and Affect: Mood normal.        Behavior: Behavior normal.        Thought Content: Thought content normal.        Judgment: Judgment normal.    Assessment & Plan:  B12 deficiency -     Intrinsic Factor Antibodies; Future -     Methylmalonic acid, serum; Future -     Cyanocobalamin   Microalbuminuria due to type 2 diabetes mellitus (HCC) -     Basic metabolic panel;  Future  Hypothyroidism (acquired) -     TSH; Future  Encounter for general adult medical examination with abnormal findings Assessment & Plan: Recent  history and treatment of abdominal wall abscesses.  Counselled om the importance of wound managemet.    age appropriate education and counseling updated, referrals for preventative services and immunizations addressed, dietary and smoking counseling addressed, most recent labs reviewed.  I have personally reviewed and have noted:   1) the patient's medical and social history 2) The pt's use of alcohol, tobacco, and illicit drugs 3) The patient's current medications and supplements 4) Functional ability including ADL's, fall risk, home  safety risk, hearing and visual impairment 5) Diet and physical activities 6) Evidence for depression or mood disorder 7) The patient's height, weight, and BMI have been recorded in the chart  I have made referrals, and provided counseling and education based on review of the above    Morbid obesity [facility]) Assessment & Plan: Patient has been successful at losing a few lbs on her own since last visit due to renewed efforts of portion reduction and exercise.    Hyperlipidemia associated with type 2 diabetes mellitus (HCC) Assessment & Plan: Her diabetes remains diet controlled . She is tolerating every other day atorvastatin .  She has no microalbuminuria.  Reviewed the benefits of SGLT 2 inhibitors and GLP 1 agonists with her today .  She remains ambivalent to adding medications  and has deferred therapy.  .  Return in 3 months    Lab Results  Component Value Date   HGBA1C 6.5 10/14/2023   Lab Results  Component Value Date   LABMICR <3.0 03/06/2017   MICROALBUR 3.7 (H) 10/14/2023   MICROALBUR <0.7 04/08/2023   Lab Results  Component Value Date   CHOL 153 10/14/2023   HDL 57.70 10/14/2023   LDLCALC 59 10/14/2023   LDLDIRECT 58.0 10/04/2022   TRIG 181.0 (H) 10/14/2023   CHOLHDL 3 10/14/2023         Other orders -     Atorvastatin  Calcium ; Take 1 tablet (40 mg total) by mouth daily.  Dispense: 90 tablet; Refill: 1 -     Levothyroxine  Sodium; TAKE 1 TABLET EVERY DAY ON EMPTY STOMACHWITH A GLASS OF WATER AT LEAST 30-60 MINBEFORE BREAKFAST  Dispense: 90 tablet; Refill: 0 -     Telmisartan ; Take 1 tablet (20 mg total) by mouth daily.  Dispense: 30 tablet; Refill: 1      I provided 40 minutes of  face-to-face time during this encounter reviewing patient's current problems and past surgeries,  recent labs and imaging studies, providing counseling on the above mentioned problems , and coordination  of care .   Follow-up: Return in about 1 week (around 10/25/2023).   Verneita LITTIE Kettering, MD

## 2023-10-20 NOTE — Assessment & Plan Note (Signed)
 Recent  history and treatment of abdominal wall abscesses.  Counselled om the importance of wound managemet.    age appropriate education and counseling updated, referrals for preventative services and immunizations addressed, dietary and smoking counseling addressed, most recent labs reviewed.  I have personally reviewed and have noted:   1) the patient's medical and social history 2) The pt's use of alcohol, tobacco, and illicit drugs 3) The patient's current medications and supplements 4) Functional ability including ADL's, fall risk, home safety risk, hearing and visual impairment 5) Diet and physical activities 6) Evidence for depression or mood disorder 7) The patient's height, weight, and BMI have been recorded in the chart  I have made referrals, and provided counseling and education based on review of the above

## 2023-10-20 NOTE — Assessment & Plan Note (Signed)
 Patient has been successful at losing a few lbs on her own since last visit due to renewed efforts of portion reduction and exercise.

## 2023-10-20 NOTE — Assessment & Plan Note (Signed)
 Her diabetes remains diet controlled . She is tolerating every other day atorvastatin .  She has no microalbuminuria.  Reviewed the benefits of SGLT 2 inhibitors and GLP 1 agonists with her today .  She remains ambivalent to adding medications  and has deferred therapy.  .  Return in 3 months    Lab Results  Component Value Date   HGBA1C 6.5 10/14/2023   Lab Results  Component Value Date   LABMICR <3.0 03/06/2017   MICROALBUR 3.7 (H) 10/14/2023   MICROALBUR <0.7 04/08/2023   Lab Results  Component Value Date   CHOL 153 10/14/2023   HDL 57.70 10/14/2023   LDLCALC 59 10/14/2023   LDLDIRECT 58.0 10/04/2022   TRIG 181.0 (H) 10/14/2023   CHOLHDL 3 10/14/2023

## 2023-10-25 ENCOUNTER — Other Ambulatory Visit: Payer: Medicare HMO

## 2023-10-25 ENCOUNTER — Ambulatory Visit (INDEPENDENT_AMBULATORY_CARE_PROVIDER_SITE_OTHER): Payer: Medicare HMO

## 2023-10-25 DIAGNOSIS — E039 Hypothyroidism, unspecified: Secondary | ICD-10-CM | POA: Diagnosis not present

## 2023-10-25 DIAGNOSIS — E1129 Type 2 diabetes mellitus with other diabetic kidney complication: Secondary | ICD-10-CM | POA: Diagnosis not present

## 2023-10-25 DIAGNOSIS — E538 Deficiency of other specified B group vitamins: Secondary | ICD-10-CM | POA: Diagnosis not present

## 2023-10-25 DIAGNOSIS — R809 Proteinuria, unspecified: Secondary | ICD-10-CM | POA: Diagnosis not present

## 2023-10-25 LAB — TSH: TSH: 3.16 u[IU]/mL (ref 0.35–5.50)

## 2023-10-25 LAB — BASIC METABOLIC PANEL
BUN: 9 mg/dL (ref 6–23)
CO2: 32 meq/L (ref 19–32)
Calcium: 8.9 mg/dL (ref 8.4–10.5)
Chloride: 100 meq/L (ref 96–112)
Creatinine, Ser: 0.81 mg/dL (ref 0.40–1.20)
GFR: 70.44 mL/min (ref >60.00)
Glucose, Bld: 144 mg/dL — ABNORMAL HIGH (ref 70–99)
Potassium: 3.6 meq/L (ref 3.5–5.1)
Sodium: 142 meq/L (ref 135–145)

## 2023-10-25 MED ORDER — CYANOCOBALAMIN 1000 MCG/ML IJ SOLN
1000.0000 ug | Freq: Once | INTRAMUSCULAR | Status: AC
Start: 2023-10-25 — End: 2023-10-25
  Administered 2023-10-25: 1000 ug via INTRAMUSCULAR

## 2023-10-25 NOTE — Progress Notes (Signed)
 Pt presented for their vitamin B12 injection. Pt was identified through two identifiers. Pt tolerated shot well in their right deltoid.

## 2023-10-29 LAB — METHYLMALONIC ACID, SERUM: Methylmalonic Acid, Quant: 175 nmol/L (ref 69–390)

## 2023-10-29 LAB — INTRINSIC FACTOR ANTIBODIES: Intrinsic Factor: NEGATIVE

## 2023-12-20 ENCOUNTER — Other Ambulatory Visit: Payer: Self-pay | Admitting: Internal Medicine

## 2024-01-13 ENCOUNTER — Other Ambulatory Visit: Payer: Self-pay | Admitting: Internal Medicine

## 2024-02-25 ENCOUNTER — Telehealth: Payer: Self-pay

## 2024-02-25 ENCOUNTER — Other Ambulatory Visit (HOSPITAL_COMMUNITY): Payer: Self-pay

## 2024-02-25 NOTE — Telephone Encounter (Signed)
 Pt ready for scheduling for PROLIA  on or after : 03/26/24  Option# 1: Buy/Bill (Office supplied medication)  Out-of-pocket cost due at time of clinic visit: $347  Number of injection/visits approved: 2  Primary: [employer] Prolia  co-insurance: 20% Admin fee co-insurance: $15  Secondary: --- Prolia  co-insurance:  Admin fee co-insurance:   Medical Benefit Details: Date Benefits were checked: 02/03/24 Deductible: NO/ Coinsurance: 20%/ Admin Fee: $15  Prior Auth: APPROVED PA# [id number] Expiration Date: 10/16/23-10/14/24   # of doses approved: 2 ----------------------------------------------------------------------- Option# 2- Med Obtained from pharmacy:  Pharmacy benefit: Copay $930.81 (Paid to pharmacy) Admin Fee: $15 (Pay at clinic)  Prior Auth: N/A PA# Expiration Date:   # of doses approved:   If patient wants fill through the pharmacy benefit please send prescription to: [employer], and include estimated need by date in rx notes. Pharmacy will ship medication directly to the office.  Patient NOT eligible for Prolia  Copay Card. Copay Card can make patient's cost as little as $25. Link to apply: [email or web address]://[email or web address]  ** This summary of benefits is an estimation of the patient's out-of-pocket cost. Exact cost may very based on individual plan coverage.

## 2024-02-25 NOTE — Telephone Encounter (Signed)
 Carol [name]

## 2024-03-24 ENCOUNTER — Other Ambulatory Visit: Payer: Self-pay | Admitting: Internal Medicine

## 2024-03-24 ENCOUNTER — Other Ambulatory Visit: Payer: Self-pay | Admitting: Nurse Practitioner

## 2024-03-27 ENCOUNTER — Other Ambulatory Visit: Payer: Self-pay

## 2024-03-27 ENCOUNTER — Other Ambulatory Visit: Payer: Self-pay | Admitting: *Deleted

## 2024-03-27 DIAGNOSIS — M85852 Other specified disorders of bone density and structure, left thigh: Secondary | ICD-10-CM

## 2024-04-01 ENCOUNTER — Ambulatory Visit

## 2024-04-01 DIAGNOSIS — M85852 Other specified disorders of bone density and structure, left thigh: Secondary | ICD-10-CM | POA: Diagnosis not present

## 2024-04-01 LAB — VITAMIN D 25 HYDROXY (VIT D DEFICIENCY, FRACTURES): VITD: 37.5 ng/mL (ref 30.00–100.00)

## 2024-04-01 MED ORDER — DENOSUMAB 60 MG/ML ~~LOC~~ SOSY
60.0000 mg | PREFILLED_SYRINGE | SUBCUTANEOUS | Status: AC
  Administered 2024-10-26: 60 mg via SUBCUTANEOUS

## 2024-04-01 NOTE — Progress Notes (Signed)
 Patient is in office today for a nurse visit for  Prolia . Patient Injection was given in the  Right arm. Patient tolerated injection well.

## 2024-04-03 ENCOUNTER — Ambulatory Visit: Payer: Self-pay | Admitting: Internal Medicine

## 2024-05-26 ENCOUNTER — Other Ambulatory Visit: Payer: Self-pay | Admitting: Nurse Practitioner

## 2024-05-26 ENCOUNTER — Other Ambulatory Visit: Payer: Self-pay | Admitting: Internal Medicine

## 2024-06-11 DIAGNOSIS — H1013 Acute atopic conjunctivitis, bilateral: Secondary | ICD-10-CM | POA: Diagnosis not present

## 2024-06-11 DIAGNOSIS — H04123 Dry eye syndrome of bilateral lacrimal glands: Secondary | ICD-10-CM | POA: Diagnosis not present

## 2024-06-11 DIAGNOSIS — E119 Type 2 diabetes mellitus without complications: Secondary | ICD-10-CM | POA: Diagnosis not present

## 2024-06-11 DIAGNOSIS — H43813 Vitreous degeneration, bilateral: Secondary | ICD-10-CM | POA: Diagnosis not present

## 2024-06-11 LAB — HM DIABETES EYE EXAM

## 2024-06-18 ENCOUNTER — Other Ambulatory Visit: Payer: Self-pay | Admitting: Internal Medicine

## 2024-06-19 ENCOUNTER — Telehealth: Payer: Self-pay

## 2024-06-19 DIAGNOSIS — Z1231 Encounter for screening mammogram for malignant neoplasm of breast: Secondary | ICD-10-CM

## 2024-06-19 DIAGNOSIS — E039 Hypothyroidism, unspecified: Secondary | ICD-10-CM

## 2024-06-19 DIAGNOSIS — E1169 Type 2 diabetes mellitus with other specified complication: Secondary | ICD-10-CM

## 2024-06-19 DIAGNOSIS — E538 Deficiency of other specified B group vitamins: Secondary | ICD-10-CM

## 2024-06-19 NOTE — Telephone Encounter (Signed)
 Copied from CRM #[id number]. Topic: Clinical - Request for Lab/Test Order >> Jun 19, 2024  1:30 PM Suzen RAMAN wrote: Reason for CRM: Patient would like orders placed to have labs completed prior to Physical which is scheduled  for Nov 10th. Patient would also like orders/referral placed for a mammogram. Please contact patient to schedule once lab orders are placed.  CB#[phone] (H)

## 2024-06-22 NOTE — Addendum Note (Signed)
 Addended by: MARYLYNN VERNEITA CROME on: 06/22/2024 06:11 PM   Modules accepted: Orders

## 2024-06-22 NOTE — Addendum Note (Signed)
 Addended by: HARRIETTE RAISIN on: 06/22/2024 03:23 PM   Modules accepted: Orders

## 2024-06-22 NOTE — Telephone Encounter (Signed)
 Mammogram has been ordered. I have pended the lab orders for your approval. If needed I will call and get pt scheduled for lab appt.

## 2024-06-23 NOTE — Telephone Encounter (Signed)
Lab appt has been scheduled. Pt is aware of appt date and time.  

## 2024-06-26 ENCOUNTER — Other Ambulatory Visit: Payer: Self-pay | Admitting: Internal Medicine

## 2024-08-20 ENCOUNTER — Other Ambulatory Visit

## 2024-08-20 DIAGNOSIS — E1169 Type 2 diabetes mellitus with other specified complication: Secondary | ICD-10-CM | POA: Diagnosis not present

## 2024-08-20 DIAGNOSIS — E785 Hyperlipidemia, unspecified: Secondary | ICD-10-CM | POA: Diagnosis not present

## 2024-08-20 DIAGNOSIS — E538 Deficiency of other specified B group vitamins: Secondary | ICD-10-CM | POA: Diagnosis not present

## 2024-08-20 DIAGNOSIS — E039 Hypothyroidism, unspecified: Secondary | ICD-10-CM | POA: Diagnosis not present

## 2024-08-20 LAB — CBC WITH DIFFERENTIAL/PLATELET
Basophils Absolute: 0 K/uL (ref 0.0–0.1)
Basophils Relative: 0.6 % (ref 0.0–3.0)
Eosinophils Absolute: 0.2 K/uL (ref 0.0–0.7)
Eosinophils Relative: 2.5 % (ref 0.0–5.0)
HCT: 41 % (ref 36.0–46.0)
Hemoglobin: 13.9 g/dL (ref 12.0–15.0)
Lymphocytes Relative: 25.6 % (ref 12.0–46.0)
Lymphs Abs: 1.8 K/uL (ref 0.7–4.0)
MCHC: 33.8 g/dL (ref 30.0–36.0)
MCV: 89.5 fl (ref 78.0–100.0)
Monocytes Absolute: 0.5 K/uL (ref 0.1–1.0)
Monocytes Relative: 7.3 % (ref 3.0–12.0)
Neutro Abs: 4.4 K/uL (ref 1.4–7.7)
Neutrophils Relative %: 64 % (ref 43.0–77.0)
Platelets: 195 K/uL (ref 150.0–400.0)
RBC: 4.58 Mil/uL (ref 3.87–5.11)
RDW: 13.9 % (ref 11.5–15.5)
WBC: 6.9 K/uL (ref 4.0–10.5)

## 2024-08-20 LAB — COMPREHENSIVE METABOLIC PANEL WITH GFR
ALT: 12 U/L (ref 0–35)
AST: 12 U/L (ref 0–37)
Albumin: 4.2 g/dL (ref 3.5–5.2)
Alkaline Phosphatase: 52 U/L (ref 39–117)
BUN: 9 mg/dL (ref 6–23)
CO2: 35 meq/L — ABNORMAL HIGH (ref 19–32)
Calcium: 9.1 mg/dL (ref 8.4–10.5)
Chloride: 97 meq/L (ref 96–112)
Creatinine, Ser: 0.85 mg/dL (ref 0.40–1.20)
GFR: 66.1 mL/min (ref >60.00)
Glucose, Bld: 151 mg/dL — ABNORMAL HIGH (ref 70–99)
Potassium: 4 meq/L (ref 3.5–5.1)
Sodium: 142 meq/L (ref 135–145)
Total Bilirubin: 0.7 mg/dL (ref 0.2–1.2)
Total Protein: 6.4 g/dL (ref 6.0–8.3)

## 2024-08-20 LAB — LIPID PANEL
Cholesterol: 129 mg/dL (ref 0–200)
HDL: 51.5 mg/dL (ref >39.00)
LDL Cholesterol: 50 mg/dL (ref 0–99)
NonHDL: 77.58
Total CHOL/HDL Ratio: 3
Triglycerides: 140 mg/dL (ref 0.0–149.0)
VLDL: 28 mg/dL (ref 0.0–40.0)

## 2024-08-20 LAB — LDL CHOLESTEROL, DIRECT: Direct LDL: 62 mg/dL

## 2024-08-20 LAB — MICROALBUMIN / CREATININE URINE RATIO
Creatinine,U: 80.7 mg/dL
Microalb Creat Ratio: 8.8 mg/g (ref 0.0–30.0)
Microalb, Ur: 0.7 mg/dL (ref 0.0–1.9)

## 2024-08-20 LAB — TSH: TSH: 3.52 u[IU]/mL (ref 0.35–5.50)

## 2024-08-20 LAB — HEMOGLOBIN A1C: Hgb A1c MFr Bld: 6.8 % — ABNORMAL HIGH (ref 4.6–6.5)

## 2024-08-23 ENCOUNTER — Ambulatory Visit: Payer: Self-pay | Admitting: Internal Medicine

## 2024-08-24 ENCOUNTER — Encounter: Payer: Self-pay | Admitting: Internal Medicine

## 2024-08-24 ENCOUNTER — Ambulatory Visit (INDEPENDENT_AMBULATORY_CARE_PROVIDER_SITE_OTHER): Admitting: Internal Medicine

## 2024-08-24 DIAGNOSIS — G2581 Restless legs syndrome: Secondary | ICD-10-CM | POA: Diagnosis not present

## 2024-08-24 DIAGNOSIS — E785 Hyperlipidemia, unspecified: Secondary | ICD-10-CM

## 2024-08-24 DIAGNOSIS — M48061 Spinal stenosis, lumbar region without neurogenic claudication: Secondary | ICD-10-CM

## 2024-08-24 DIAGNOSIS — H9201 Otalgia, right ear: Secondary | ICD-10-CM

## 2024-08-24 DIAGNOSIS — Z23 Encounter for immunization: Secondary | ICD-10-CM

## 2024-08-24 DIAGNOSIS — D126 Benign neoplasm of colon, unspecified: Secondary | ICD-10-CM

## 2024-08-24 DIAGNOSIS — E1169 Type 2 diabetes mellitus with other specified complication: Secondary | ICD-10-CM | POA: Diagnosis not present

## 2024-08-24 DIAGNOSIS — I878 Other specified disorders of veins: Secondary | ICD-10-CM

## 2024-08-24 DIAGNOSIS — Z7985 Long-term (current) use of injectable non-insulin antidiabetic drugs: Secondary | ICD-10-CM

## 2024-08-24 DIAGNOSIS — E119 Type 2 diabetes mellitus without complications: Secondary | ICD-10-CM

## 2024-08-24 DIAGNOSIS — G8929 Other chronic pain: Secondary | ICD-10-CM

## 2024-08-24 MED ORDER — TELMISARTAN 20 MG PO TABS: 20.0000 mg | ORAL_TABLET | Freq: Every day | ORAL | 1 refills | Status: AC

## 2024-08-24 MED ORDER — TETANUS-DIPHTH-ACELL PERTUSSIS 5-2-15.5 LF-MCG/0.5 IM SUSP: 0.5000 mL | Freq: Once | INTRAMUSCULAR | 0 refills | Status: AC

## 2024-08-24 MED ORDER — ROPINIROLE HCL 0.5 MG PO TABS: 0.5000 mg | ORAL_TABLET | Freq: Every day | ORAL | 3 refills | Status: AC

## 2024-08-24 MED ORDER — SERTRALINE HCL 50 MG PO TABS: 50.0000 mg | ORAL_TABLET | Freq: Every day | ORAL | 1 refills | Status: AC

## 2024-08-24 MED ORDER — FUROSEMIDE 20 MG PO TABS: 20.0000 mg | ORAL_TABLET | Freq: Two times a day (BID) | ORAL | 1 refills | Status: AC

## 2024-08-24 MED ORDER — TIRZEPATIDE 2.5 MG/0.5ML ~~LOC~~ SOAJ: 2.5000 mg | SUBCUTANEOUS | 2 refills | Status: AC

## 2024-08-24 NOTE — Progress Notes (Unsigned)
 Patient ID: Carol [name], female    DOB: May 11, 1946  Age: 78 y.o. MRN: [id number]  The patient is here for annual preventive examination and management of other chronic and acute problems.   The risk factors are reflected in the social history.   The roster of all physicians providing medical care to patient - is listed in the Snapshot section of the chart.   Activities of daily living:  The patient is 100% independent in all ADLs: dressing, toileting, feeding as well as independent mobility   Home safety : The patient has smoke detectors in the home. They wear seatbelts.  There are no unsecured firearms at home. There is no violence in the home.    There is no risks for hepatitis, STDs or HIV. There is no   history of blood transfusion. They have no travel history to infectious disease endemic areas of the world.   The patient has seen their dentist in the last six month. They have seen their eye doctor in the last year. The patinet  denies slight hearing difficulty with regard to whispered voices and some television programs.  They have deferred audiologic testing in the last year.  They do not  have excessive sun exposure. Discussed the need for sun protection: hats, long sleeves and use of sunscreen if there is significant sun exposure.    Diet: the importance of a healthy diet is discussed. They do have a healthy diet.   The benefits of regular aerobic exercise were discussed. The patient does not exercise.    Depression screen: there are no signs or vegative symptoms of depression- irritability, change in appetite, anhedonia, sadness/tearfullness.   The following portions of the patient's history were reviewed and updated as appropriate: allergies, current medications, past family history, past medical history,  past surgical history, past social history  and problem list.   Visual acuity was not assessed per patient preference since the patient has regular follow up with an   ophthalmologist. Hearing and body mass index were assessed and reviewed.    During the course of the visit the patient was educated and counseled about appropriate screening and preventive services including : fall prevention , diabetes screening, nutrition counseling, colorectal cancer screening, and recommended immunizations.    Chief Complaint:  Persistent purple lesions on left side of abdomen that remained after abscesses were treated   T2DM:  She  feels generally well,  But is not  exercising regularly or trying to lose weight. Checking  blood sugars less than once daily at variable times, usually only if she feels she may be having a hypoglycemic event. .  BS have been under 130 fasting and < 150 post prandially.  Denies any recent hypoglyemic events.  Controlling with diet .  Following a carbohydrate modified diet 6 days per week. Denies numbness, burning and tingling of extremities. Appetite is good.     Review of Symptoms  Patient denies headache, fevers, malaise, unintentional weight loss, skin rash, eye pain, sinus congestion and sinus pain, sore throat, dysphagia,  hemoptysis , cough, dyspnea, wheezing, chest pain, palpitations, orthopnea, edema, abdominal pain, nausea, melena, diarrhea, constipation, flank pain, dysuria, hematuria, urinary  Frequency, nocturia, numbness, tingling, seizures,  Focal weakness, Loss of consciousness,  Tremor, insomnia, depression, anxiety, and suicidal ideation.    Physical Exam:  There were no vitals taken for this visit.   Physical Exam Vitals reviewed.  Constitutional:      General: She is not in acute distress.  Appearance: Normal appearance. She is well-developed. She is obese. She is not ill-appearing, toxic-appearing or diaphoretic.  HENT:     Head: Normocephalic.     Right Ear: Ear canal and external ear normal. Decreased hearing noted. There is no impacted cerumen. Tympanic membrane is injected.     Left Ear: Tympanic membrane, ear  canal and external ear normal. There is no impacted cerumen.     Ears:     Comments: Right eardrum semi occluded by wax,  appears intact with mild erythema noted     Nose: Nose normal.     Mouth/Throat:     Mouth: Mucous membranes are moist.     Pharynx: Oropharynx is clear.  Eyes:     General: No scleral icterus.       Right eye: No discharge.        Left eye: No discharge.     Conjunctiva/sclera: Conjunctivae normal.     Pupils: Pupils are equal, round, and reactive to light.  Neck:     Thyroid : No thyromegaly.     Vascular: No carotid bruit or JVD.  Cardiovascular:     Rate and Rhythm: Normal rate and regular rhythm.     Heart sounds: Normal heart sounds.  Pulmonary:     Effort: Pulmonary effort is normal. No respiratory distress.     Breath sounds: Normal breath sounds.  Chest:  Breasts:    Breasts are symmetrical.     Right: Normal. No swelling, inverted nipple, mass, nipple discharge, skin change or tenderness.     Left: Normal. No swelling, inverted nipple, mass, nipple discharge, skin change or tenderness.  Abdominal:     General: Abdomen is protuberant. Bowel sounds are normal.     Palpations: Abdomen is soft. There is no mass.     Tenderness: There is no abdominal tenderness. There is no guarding or rebound.      Comments: 2 purple raised lesions (keloids?) in area of fomer abscess.   Musculoskeletal:        General: Normal range of motion.     Cervical back: Normal range of motion and neck supple.  Lymphadenopathy:     Cervical: No cervical adenopathy.     Upper Body:     Right upper body: No supraclavicular, axillary or pectoral adenopathy.     Left upper body: No supraclavicular, axillary or pectoral adenopathy.  Skin:    General: Skin is warm and dry.  Neurological:     General: No focal deficit present.     Mental Status: She is alert and oriented to person, place, and time. Mental status is at baseline.  Psychiatric:        Mood and Affect: Mood normal.         Behavior: Behavior normal.        Thought Content: Thought content normal.        Judgment: Judgment normal.    Assessment and Plan: Venous stasis  Restless leg syndrome    No follow-ups on file.  Verneita LITTIE Kettering, MD

## 2024-08-24 NOTE — Patient Instructions (Addendum)
  I 'm glad you have decided to restart a medication to help you lose weight,  called Mounjaro.  The dose for the first 4 weekly doses is 2.5 mg .  You may have mild nausea on the first or second day but this should resolve.  If it does not resolve after a few days.   stop the medication.   As long as you are losing weight,  you can continue the dose you are on .  Only increase the dose to  5.0 mg  after 4 weeks if your weight has plateaued.   Each month you will need to  Let me know f you want the dose increased; otherwise you will use the refills on the prior dose       You are due for your tetanus-diptheria-pertussis vaccine   (TDaP)   You must get it at your pharmacy

## 2024-08-24 NOTE — Assessment & Plan Note (Signed)
 She is interested in losing weight to help control her back pain  .  Mounjaro precribed

## 2024-08-24 NOTE — Assessment & Plan Note (Signed)
 Patient has not been successful at losing a few lbs on her own since last visit due to renewed efforts of portion reduction and exercise.

## 2024-08-24 NOTE — Assessment & Plan Note (Signed)
 Still has pain aggravated by standing for long periods of time.

## 2024-08-25 NOTE — Assessment & Plan Note (Signed)
 Her diabetes remains diet controlled . She is tolerating every other day atorvastatin .  She has no microalbuminuria.  Reviewed the benefits of SGLT 2 inhibitors and GLP 1 agonists with her today .  She remains ambivalent to adding medications  and has deferred therapy.  .  Return in 3 months    Lab Results  Component Value Date   HGBA1C 6.8 (H) 08/20/2024   Lab Results  Component Value Date   LABMICR <3.0 03/06/2017   MICROALBUR 0.7 08/20/2024   Lab Results  Component Value Date   CHOL 129 08/20/2024   HDL 51.50 08/20/2024   LDLCALC 50 08/20/2024   LDLDIRECT 62.0 08/20/2024   TRIG 140.0 08/20/2024   CHOLHDL 3 08/20/2024

## 2024-08-25 NOTE — Assessment & Plan Note (Signed)
 Two by 2019 colonoscopy.  5 year follow up was advised but has been deferred . Will recommend repeat colonoscopy

## 2024-09-01 ENCOUNTER — Telehealth: Payer: Self-pay

## 2024-09-01 NOTE — Telephone Encounter (Signed)
 Prolia  VOB initiated via [email or web address]  Next Prolia  inj DUE: 10/01/24

## 2024-09-02 ENCOUNTER — Other Ambulatory Visit (HOSPITAL_COMMUNITY): Payer: Self-pay

## 2024-09-02 NOTE — Telephone Encounter (Signed)
 Pt ready for scheduling for PROLIA  on or after : 10/01/24  Option# 1: Buy/Bill (Office supplied medication)  Out-of-pocket cost due at time of clinic visit: $347  Number of injection/visits approved: 2  Primary: [employer] Prolia  co-insurance: 20% Admin fee co-insurance: $15  Secondary: --- Prolia  co-insurance:  Admin fee co-insurance:   Medical Benefit Details: Date Benefits were checked: 09/02/24 Deductible: NO/ Coinsurance: 20%/ Admin Fee: $15  Prior Auth: APPROVED PA# [id number] Expiration Date: 10/16/23-10/14/25  # of doses approved: 2 ----------------------------------------------------------------------- Option# 2- Med Obtained from pharmacy:  Pharmacy benefit: Copay $793.31 (Paid to pharmacy) Admin Fee: $15 (Pay at clinic)  Prior Auth: N/A PA# Expiration Date:   # of doses approved:   If patient wants fill through the pharmacy benefit please send prescription to: [facility], and include estimated need by date in rx notes. Pharmacy will ship medication directly to the office.  Patient NOT eligible for Prolia  Copay Card. Copay Card can make patient's cost as little as $25. Link to apply: [email or web address]  ** This summary of benefits is an estimation of the patient's out-of-pocket cost. Exact cost may very based on individual plan coverage.

## 2024-09-02 NOTE — Telephone Encounter (Signed)
 Carol [name]

## 2024-09-04 ENCOUNTER — Other Ambulatory Visit: Payer: Self-pay | Admitting: Internal Medicine

## 2024-09-05 ENCOUNTER — Other Ambulatory Visit: Payer: Self-pay | Admitting: Internal Medicine

## 2024-09-07 DIAGNOSIS — H9201 Otalgia, right ear: Secondary | ICD-10-CM | POA: Diagnosis not present

## 2024-09-07 DIAGNOSIS — H6121 Impacted cerumen, right ear: Secondary | ICD-10-CM | POA: Diagnosis not present

## 2024-09-07 DIAGNOSIS — H9193 Unspecified hearing loss, bilateral: Secondary | ICD-10-CM | POA: Diagnosis not present

## 2024-09-07 DIAGNOSIS — G8929 Other chronic pain: Secondary | ICD-10-CM | POA: Diagnosis not present

## 2024-09-07 DIAGNOSIS — H93291 Other abnormal auditory perceptions, right ear: Secondary | ICD-10-CM | POA: Diagnosis not present

## 2024-09-18 ENCOUNTER — Inpatient Hospital Stay: Admission: RE | Admit: 2024-09-18 | Discharge: 2024-09-18 | Attending: Internal Medicine | Admitting: Internal Medicine

## 2024-09-18 DIAGNOSIS — Z1231 Encounter for screening mammogram for malignant neoplasm of breast: Secondary | ICD-10-CM

## 2024-09-28 ENCOUNTER — Encounter: Payer: Self-pay | Admitting: *Deleted

## 2024-10-06 ENCOUNTER — Encounter: Payer: Self-pay | Admitting: Pharmacist

## 2024-10-06 NOTE — Progress Notes (Signed)
 Pharmacy Quality Measure Review  This patient is appearing on a report for being at risk of failing the adherence measure for cholesterol (statin) medications this calendar year.   Medication: atorvastatin  40 mg Last fill date: 8/12 for 90 day supply  Insurance report was not up to date. No action needed at this time.  Medication has been refilled as of 09/04/24 x90ds. Filled at [facility], delayed reporting.

## 2024-10-23 NOTE — Telephone Encounter (Signed)
Mailed letter also.

## 2024-10-23 NOTE — Telephone Encounter (Signed)
 Left voicemail to return call to schedule overdue Prolia  injection. Amount due $347

## 2024-10-26 ENCOUNTER — Ambulatory Visit

## 2024-10-26 DIAGNOSIS — M85852 Other specified disorders of bone density and structure, left thigh: Secondary | ICD-10-CM

## 2024-10-26 DIAGNOSIS — M810 Age-related osteoporosis without current pathological fracture: Secondary | ICD-10-CM | POA: Diagnosis not present

## 2024-10-26 MED ORDER — DENOSUMAB 60 MG/ML ~~LOC~~ SOSY: 60.0000 mg | PREFILLED_SYRINGE | SUBCUTANEOUS | Status: AC

## 2024-10-26 NOTE — Progress Notes (Signed)
Pt presented for their subcutaneous Prolia injection. Pt was identified through two identifiers. Pt was given the information packets about the Prolia and told to schedule their next injection 6 months out. Pt tolerated the subq injection well in the right arm.  

## 2024-10-30 ENCOUNTER — Other Ambulatory Visit: Payer: Self-pay | Admitting: Internal Medicine

## 2024-11-25 ENCOUNTER — Ambulatory Visit: Admitting: Internal Medicine

## 2025-04-26 ENCOUNTER — Ambulatory Visit
